# Patient Record
Sex: Female | Born: 1970 | Race: Black or African American | Hispanic: No | Marital: Single | Smoking: Current every day smoker
Health system: Southern US, Community
[De-identification: ages and names within clinical notes are randomized; demographics above are authoritative.]

## PROBLEM LIST (undated history)

## (undated) DIAGNOSIS — I10 Essential (primary) hypertension: Secondary | ICD-10-CM

## (undated) DIAGNOSIS — J40 Bronchitis, not specified as acute or chronic: Secondary | ICD-10-CM

## (undated) DIAGNOSIS — M79672 Pain in left foot: Secondary | ICD-10-CM

## (undated) DIAGNOSIS — R45851 Suicidal ideations: Secondary | ICD-10-CM

## (undated) DIAGNOSIS — G8929 Other chronic pain: Secondary | ICD-10-CM

## (undated) DIAGNOSIS — J45909 Unspecified asthma, uncomplicated: Secondary | ICD-10-CM

## (undated) DIAGNOSIS — F1910 Other psychoactive substance abuse, uncomplicated: Secondary | ICD-10-CM

## (undated) DIAGNOSIS — K219 Gastro-esophageal reflux disease without esophagitis: Secondary | ICD-10-CM

## (undated) DIAGNOSIS — R569 Unspecified convulsions: Secondary | ICD-10-CM

## (undated) HISTORY — DX: Unspecified convulsions: R56.9

## (undated) HISTORY — PX: ABDOMINAL HYSTERECTOMY: SHX81

---

## 2001-05-22 ENCOUNTER — Emergency Department (HOSPITAL_COMMUNITY): Admission: EM | Admit: 2001-05-22 | Discharge: 2001-05-22 | Payer: Self-pay | Admitting: Emergency Medicine

## 2002-05-20 ENCOUNTER — Emergency Department (HOSPITAL_COMMUNITY): Admission: EM | Admit: 2002-05-20 | Discharge: 2002-05-20 | Payer: Self-pay | Admitting: Emergency Medicine

## 2002-09-06 ENCOUNTER — Emergency Department (HOSPITAL_COMMUNITY): Admission: EM | Admit: 2002-09-06 | Discharge: 2002-09-07 | Payer: Self-pay | Admitting: Emergency Medicine

## 2002-09-06 ENCOUNTER — Emergency Department (HOSPITAL_COMMUNITY): Admission: EM | Admit: 2002-09-06 | Discharge: 2002-09-06 | Payer: Self-pay | Admitting: Emergency Medicine

## 2002-09-06 ENCOUNTER — Encounter: Payer: Self-pay | Admitting: Emergency Medicine

## 2002-09-17 ENCOUNTER — Emergency Department (HOSPITAL_COMMUNITY): Admission: EM | Admit: 2002-09-17 | Discharge: 2002-09-17 | Payer: Self-pay

## 2002-10-21 ENCOUNTER — Emergency Department (HOSPITAL_COMMUNITY): Admission: EM | Admit: 2002-10-21 | Discharge: 2002-10-21 | Payer: Self-pay | Admitting: Emergency Medicine

## 2003-02-10 ENCOUNTER — Emergency Department (HOSPITAL_COMMUNITY): Admission: EM | Admit: 2003-02-10 | Discharge: 2003-02-10 | Payer: Self-pay | Admitting: Emergency Medicine

## 2003-05-10 ENCOUNTER — Encounter: Payer: Self-pay | Admitting: General Surgery

## 2003-05-10 ENCOUNTER — Inpatient Hospital Stay (HOSPITAL_COMMUNITY): Admission: AD | Admit: 2003-05-10 | Discharge: 2003-05-16 | Payer: Self-pay | Admitting: General Surgery

## 2003-05-16 ENCOUNTER — Encounter: Payer: Self-pay | Admitting: General Surgery

## 2003-07-08 ENCOUNTER — Emergency Department (HOSPITAL_COMMUNITY): Admission: EM | Admit: 2003-07-08 | Discharge: 2003-07-08 | Payer: Self-pay | Admitting: Emergency Medicine

## 2003-07-28 ENCOUNTER — Emergency Department (HOSPITAL_COMMUNITY): Admission: EM | Admit: 2003-07-28 | Discharge: 2003-07-28 | Payer: Self-pay | Admitting: Emergency Medicine

## 2003-09-14 ENCOUNTER — Emergency Department (HOSPITAL_COMMUNITY): Admission: EM | Admit: 2003-09-14 | Discharge: 2003-09-14 | Payer: Self-pay | Admitting: Emergency Medicine

## 2003-10-21 ENCOUNTER — Ambulatory Visit (HOSPITAL_COMMUNITY): Admission: RE | Admit: 2003-10-21 | Discharge: 2003-10-21 | Payer: Self-pay | Admitting: General Surgery

## 2003-11-10 ENCOUNTER — Emergency Department (HOSPITAL_COMMUNITY): Admission: EM | Admit: 2003-11-10 | Discharge: 2003-11-11 | Payer: Self-pay | Admitting: Emergency Medicine

## 2003-11-15 ENCOUNTER — Ambulatory Visit (HOSPITAL_COMMUNITY): Admission: RE | Admit: 2003-11-15 | Discharge: 2003-11-15 | Payer: Self-pay | Admitting: Internal Medicine

## 2004-01-04 ENCOUNTER — Emergency Department (HOSPITAL_COMMUNITY): Admission: EM | Admit: 2004-01-04 | Discharge: 2004-01-04 | Payer: Self-pay | Admitting: Emergency Medicine

## 2004-01-13 ENCOUNTER — Emergency Department (HOSPITAL_COMMUNITY): Admission: EM | Admit: 2004-01-13 | Discharge: 2004-01-13 | Payer: Self-pay | Admitting: Emergency Medicine

## 2004-04-13 ENCOUNTER — Emergency Department (HOSPITAL_COMMUNITY): Admission: EM | Admit: 2004-04-13 | Discharge: 2004-04-13 | Payer: Self-pay | Admitting: Emergency Medicine

## 2004-04-14 ENCOUNTER — Emergency Department (HOSPITAL_COMMUNITY): Admission: EM | Admit: 2004-04-14 | Discharge: 2004-04-14 | Payer: Self-pay | Admitting: Emergency Medicine

## 2004-07-19 ENCOUNTER — Emergency Department (HOSPITAL_COMMUNITY): Admission: EM | Admit: 2004-07-19 | Discharge: 2004-07-19 | Payer: Self-pay

## 2005-01-27 ENCOUNTER — Emergency Department (HOSPITAL_COMMUNITY): Admission: EM | Admit: 2005-01-27 | Discharge: 2005-01-27 | Payer: Self-pay | Admitting: Emergency Medicine

## 2005-03-24 ENCOUNTER — Emergency Department (HOSPITAL_COMMUNITY): Admission: EM | Admit: 2005-03-24 | Discharge: 2005-03-24 | Payer: Self-pay | Admitting: Emergency Medicine

## 2005-03-30 ENCOUNTER — Emergency Department (HOSPITAL_COMMUNITY): Admission: EM | Admit: 2005-03-30 | Discharge: 2005-03-30 | Payer: Self-pay | Admitting: Emergency Medicine

## 2006-04-20 ENCOUNTER — Ambulatory Visit (HOSPITAL_COMMUNITY): Admission: RE | Admit: 2006-04-20 | Discharge: 2006-04-20 | Payer: Self-pay | Admitting: Internal Medicine

## 2006-11-09 ENCOUNTER — Emergency Department (HOSPITAL_COMMUNITY): Admission: EM | Admit: 2006-11-09 | Discharge: 2006-11-09 | Payer: Self-pay | Admitting: Emergency Medicine

## 2007-05-19 ENCOUNTER — Emergency Department (HOSPITAL_COMMUNITY): Admission: EM | Admit: 2007-05-19 | Discharge: 2007-05-19 | Payer: Self-pay | Admitting: *Deleted

## 2007-05-19 ENCOUNTER — Emergency Department (HOSPITAL_COMMUNITY): Admission: EM | Admit: 2007-05-19 | Discharge: 2007-05-19 | Payer: Self-pay | Admitting: Emergency Medicine

## 2007-09-30 ENCOUNTER — Emergency Department (HOSPITAL_COMMUNITY): Admission: EM | Admit: 2007-09-30 | Discharge: 2007-09-30 | Payer: Self-pay | Admitting: Emergency Medicine

## 2008-01-04 ENCOUNTER — Emergency Department (HOSPITAL_COMMUNITY): Admission: EM | Admit: 2008-01-04 | Discharge: 2008-01-04 | Payer: Self-pay | Admitting: Emergency Medicine

## 2008-05-20 ENCOUNTER — Observation Stay (HOSPITAL_COMMUNITY): Admission: EM | Admit: 2008-05-20 | Discharge: 2008-05-21 | Payer: Self-pay | Admitting: Emergency Medicine

## 2008-06-28 ENCOUNTER — Emergency Department (HOSPITAL_COMMUNITY): Admission: EM | Admit: 2008-06-28 | Discharge: 2008-06-28 | Payer: Self-pay | Admitting: Emergency Medicine

## 2008-07-10 ENCOUNTER — Emergency Department (HOSPITAL_COMMUNITY): Admission: EM | Admit: 2008-07-10 | Discharge: 2008-07-10 | Payer: Self-pay | Admitting: Emergency Medicine

## 2008-12-19 ENCOUNTER — Emergency Department (HOSPITAL_COMMUNITY): Admission: EM | Admit: 2008-12-19 | Discharge: 2008-12-19 | Payer: Self-pay | Admitting: Emergency Medicine

## 2009-01-04 ENCOUNTER — Emergency Department (HOSPITAL_COMMUNITY): Admission: EM | Admit: 2009-01-04 | Discharge: 2009-01-05 | Payer: Self-pay | Admitting: Emergency Medicine

## 2009-01-25 ENCOUNTER — Emergency Department (HOSPITAL_COMMUNITY): Admission: EM | Admit: 2009-01-25 | Discharge: 2009-01-25 | Payer: Self-pay | Admitting: Emergency Medicine

## 2009-02-16 ENCOUNTER — Emergency Department (HOSPITAL_COMMUNITY): Admission: EM | Admit: 2009-02-16 | Discharge: 2009-02-16 | Payer: Self-pay | Admitting: Emergency Medicine

## 2009-02-21 ENCOUNTER — Emergency Department (HOSPITAL_COMMUNITY): Admission: EM | Admit: 2009-02-21 | Discharge: 2009-02-21 | Payer: Self-pay | Admitting: Emergency Medicine

## 2009-11-14 ENCOUNTER — Emergency Department (HOSPITAL_COMMUNITY): Admission: EM | Admit: 2009-11-14 | Discharge: 2009-11-14 | Payer: Self-pay | Admitting: Emergency Medicine

## 2010-01-27 ENCOUNTER — Emergency Department (HOSPITAL_COMMUNITY): Admission: EM | Admit: 2010-01-27 | Discharge: 2010-01-27 | Payer: Self-pay | Admitting: Emergency Medicine

## 2010-06-24 ENCOUNTER — Emergency Department (HOSPITAL_COMMUNITY): Admission: EM | Admit: 2010-06-24 | Discharge: 2010-06-24 | Payer: Self-pay | Admitting: Emergency Medicine

## 2010-06-26 ENCOUNTER — Emergency Department (HOSPITAL_COMMUNITY): Admission: EM | Admit: 2010-06-26 | Discharge: 2010-06-26 | Payer: Self-pay | Admitting: Emergency Medicine

## 2010-07-31 ENCOUNTER — Emergency Department (HOSPITAL_COMMUNITY): Admission: EM | Admit: 2010-07-31 | Discharge: 2010-07-31 | Payer: Self-pay | Admitting: Emergency Medicine

## 2010-11-16 ENCOUNTER — Emergency Department (HOSPITAL_COMMUNITY)
Admission: EM | Admit: 2010-11-16 | Discharge: 2010-11-16 | Payer: Self-pay | Source: Home / Self Care | Admitting: Emergency Medicine

## 2010-11-18 LAB — URINALYSIS, ROUTINE W REFLEX MICROSCOPIC
Bilirubin Urine: NEGATIVE
Ketones, ur: NEGATIVE mg/dL
Leukocytes, UA: NEGATIVE
Nitrite: NEGATIVE
Protein, ur: NEGATIVE mg/dL
Specific Gravity, Urine: 1.01 (ref 1.005–1.030)
Urine Glucose, Fasting: NEGATIVE mg/dL
Urobilinogen, UA: 0.2 mg/dL (ref 0.0–1.0)
pH: 6 (ref 5.0–8.0)

## 2010-11-18 LAB — URINE MICROSCOPIC-ADD ON

## 2010-11-18 LAB — DIFFERENTIAL
Basophils Absolute: 0.1 10*3/uL (ref 0.0–0.1)
Basophils Relative: 1 % (ref 0–1)
Eosinophils Absolute: 0.2 10*3/uL (ref 0.0–0.7)
Eosinophils Relative: 2 % (ref 0–5)
Lymphocytes Relative: 32 % (ref 12–46)
Lymphs Abs: 2.5 10*3/uL (ref 0.7–4.0)
Monocytes Absolute: 0.7 10*3/uL (ref 0.1–1.0)
Monocytes Relative: 9 % (ref 3–12)
Neutro Abs: 4.5 10*3/uL (ref 1.7–7.7)
Neutrophils Relative %: 56 % (ref 43–77)

## 2010-11-18 LAB — CBC
HCT: 39.6 % (ref 36.0–46.0)
Hemoglobin: 13.4 g/dL (ref 12.0–15.0)
MCH: 27.4 pg (ref 26.0–34.0)
MCHC: 33.8 g/dL (ref 30.0–36.0)
MCV: 81 fL (ref 78.0–100.0)
Platelets: 185 10*3/uL (ref 150–400)
RBC: 4.89 MIL/uL (ref 3.87–5.11)
RDW: 15.7 % — ABNORMAL HIGH (ref 11.5–15.5)
WBC: 8 10*3/uL (ref 4.0–10.5)

## 2010-11-18 LAB — GLUCOSE, CAPILLARY: Glucose-Capillary: 93 mg/dL (ref 70–99)

## 2010-11-18 LAB — BASIC METABOLIC PANEL
BUN: 12 mg/dL (ref 6–23)
CO2: 24 mEq/L (ref 19–32)
Calcium: 8.5 mg/dL (ref 8.4–10.5)
Chloride: 107 mEq/L (ref 96–112)
Creatinine, Ser: 0.84 mg/dL (ref 0.4–1.2)
GFR calc Af Amer: >60 mL/min (ref >60)
GFR calc non Af Amer: >60 mL/min (ref >60)
Glucose, Bld: 106 mg/dL — ABNORMAL HIGH (ref 70–99)
Potassium: 4.2 mEq/L (ref 3.5–5.1)
Sodium: 138 mEq/L (ref 135–145)

## 2010-11-18 LAB — POCT CARDIAC MARKERS
CKMB, poc: <1 ng/mL — ABNORMAL LOW (ref 1.0–8.0)
Myoglobin, poc: 18.2 ng/mL (ref 12–200)
Troponin i, poc: <0.05 ng/mL (ref 0.00–0.09)

## 2010-11-18 LAB — POCT PREGNANCY, URINE: Preg Test, Ur: NEGATIVE

## 2011-01-14 LAB — GLUCOSE, CAPILLARY
Glucose-Capillary: 117 mg/dL — ABNORMAL HIGH (ref 70–99)
Glucose-Capillary: 95 mg/dL (ref 70–99)

## 2011-02-10 LAB — CULTURE, ROUTINE-ABSCESS

## 2011-02-16 LAB — URINALYSIS, ROUTINE W REFLEX MICROSCOPIC
Glucose, UA: NEGATIVE mg/dL
Ketones, ur: NEGATIVE mg/dL
Leukocytes, UA: NEGATIVE
pH: 5.5 (ref 5.0–8.0)

## 2011-02-16 LAB — COMPREHENSIVE METABOLIC PANEL
ALT: 19 U/L (ref 0–35)
AST: 17 U/L (ref 0–37)
Calcium: 8.7 mg/dL (ref 8.4–10.5)
GFR calc Af Amer: >60 mL/min (ref >60)
Sodium: 136 mEq/L (ref 135–145)
Total Protein: 6.5 g/dL (ref 6.0–8.3)

## 2011-02-16 LAB — URINE MICROSCOPIC-ADD ON

## 2011-02-16 LAB — CULTURE, ROUTINE-ABSCESS

## 2011-02-16 LAB — DIFFERENTIAL
Eosinophils Absolute: 0.1 10*3/uL (ref 0.0–0.7)
Eosinophils Relative: 2 % (ref 0–5)
Lymphocytes Relative: 30 % (ref 12–46)
Lymphs Abs: 2.4 10*3/uL (ref 0.7–4.0)
Monocytes Relative: 10 % (ref 3–12)
Neutrophils Relative %: 58 % (ref 43–77)

## 2011-02-16 LAB — CBC
MCHC: 33.1 g/dL (ref 30.0–36.0)
RDW: 14.9 % (ref 11.5–15.5)

## 2011-02-16 LAB — WET PREP, GENITAL

## 2011-02-16 LAB — GC/CHLAMYDIA PROBE AMP, GENITAL
Chlamydia, DNA Probe: NEGATIVE
GC Probe Amp, Genital: NEGATIVE
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
## 2011-03-16 NOTE — Discharge Summary (Signed)
NAMESHAWNYA, MAYOR               ACCOUNT NO.:  [id number]   MEDICAL RECORD NO.:  [id number]          PATIENT TYPE:  OBV   LOCATION:  A332                          FACILITY:  [facility]   PHYSICIAN:  Osvaldo Shipper, MD     DATE OF BIRTH:  11/09/1970   DATE OF ADMISSION:  05/20/2008  DATE OF DISCHARGE:  LH                               DISCHARGE SUMMARY   ADDENDUM:  This is an addendum to the discharge summary dictated on Ms.  Mcfaul.   DISCHARGE MEDICATIONS:  1. Aspirin 81 mg daily.  2. Ketorolac 10 mg p.o. q.6 h p.r.n. for pain, #20 tablets prescribed.  3. Omeprazole 20 mg p.o. daily for 2 weeks.   FOLLOW UP:  Follow up with the health department and with mental health  for drug rehabilitation as recommended by social worker.   DIET:  Heart healthy.   ACTIVITY:  Physical activity as before.  She has been told if the foot  pain does not improve with the above regimen, she can seek attention  again.   ADDENDUM:  Uric acid level was normal.      Osvaldo Shipper, MD  Electronically Signed     GK/MEDQ  D:  05/21/2008  T:  05/21/2008  Job:  [id number]

## 2011-03-19 NOTE — H&P (Signed)
   [name], [name]                         ACCOUNT NO.:  [id number]   MEDICAL RECORD NO.:  [id number]                   PATIENT TYPE:  INP   LOCATION:  A419                                 FACILITY:  [facility]   PHYSICIAN:  Jerolyn Shin C. [name], M.D.                DATE OF BIRTH:  1971/03/08   DATE OF ADMISSION:  05/10/2003  DATE OF DISCHARGE:                                HISTORY & PHYSICAL   HISTORY OF PRESENT ILLNESS:  Katrina Good is a 40 year old with a history of  severe abdominal pain lasting greater than six months   Dictation ended at this point.                                               Katrina Good. [name], M.D.    LCS/MEDQ  D:  05/11/2003  T:  05/11/2003  Job:  [id number]

## 2011-03-19 NOTE — Discharge Summary (Signed)
Katrina Good, POMEROY A                         ACCOUNT NO.:  [id number]   MEDICAL RECORD NO.:  [id number]                   PATIENT TYPE:  INP   LOCATION:  A419                                 FACILITY:  [facility]   PHYSICIAN:  Jerolyn Shin C. [name], M.D.                DATE OF BIRTH:  25-Feb-1971   DATE OF ADMISSION:  05/10/2003  DATE OF DISCHARGE:  05/16/2003                                 DISCHARGE SUMMARY   DISCHARGE DIAGNOSES:  1. Severe dysfunctional uterine bleeding.  2. Severe depression.  3. Cocaine and marijuana abuse.  4. Alcohol abuse.  5. Chronic tobacco abuse.   SPECIAL PROCEDURE:  Total abdominal hysterectomy on May 14, 2003.   DISPOSITION:  Patient discharged home in stable condition.   DISCHARGE MEDICATIONS:  1. Mepergan Fortis two every four hours as needed for pain.  2. Reglan 10 mg a.c. and h.s.  3. The patient is scheduled to be seen in the office in one week post     discharge.   SUMMARY:  A 40 year old female with a six month history of acute severe  abdominal pain continuously.  She has had multiple episodes of severe  abdominal pain due to dysmenorrhea dating back over six years.  She was seen  in the emergency room eight times over the past six months with pain and  bleeding.  She was seen by Dr. Emelda Fear and was noted to be anemic.  She was  treated with oral contraceptives, Provera and IM estrogen without  improvement.  Ultrasounds, in the past, have shown severely thickened  endometrium with central echogenicity.  Repeat ultrasound showed similar  findings.  The patient was admitted because of severe bleeding as outline in  the admission note.  It was noted that she had a positive drug screen and  that she has a history of heavy alcohol abuse dating back to 1995.  She also  has a history of cocaine and marijuana abuse.  The patient was admitted.  She had a very tender pelvis.  Urine drug screen was positive for cocaine.  She  had severe bleeding and it was  elected to treat her with IV estrogen to  try to reduce the amount of bleeding and to allow her time for  detoxification from the cocaine.  This was done.  Her bleeding ceased.  Hemoglobin only dropped to 11.1.   Total abdominal hysterectomy was done on 14 May 2003.  She had an  uneventful postop period without complications and was discharged home on 16 May 2003 in satisfactory condition.     ___________________________________________                                         Dirk Dress [name], M.D.   LCS/MEDQ  D:  07/27/2003  T:  07/27/2003  Job:  [id number]

## 2011-03-19 NOTE — Op Note (Signed)
   Katrina Good, Katrina Good                         ACCOUNT NO.:  [id number]   MEDICAL RECORD NO.:  [id number]                   PATIENT TYPE:  INP   LOCATION:  A419                                 FACILITY:  [facility]   PHYSICIAN:  Jerolyn Shin C. [name], M.D.                DATE OF BIRTH:  02/26/1971   DATE OF PROCEDURE:  DATE OF DISCHARGE:                                 OPERATIVE REPORT   PREOPERATIVE DIAGNOSIS:  Dysfunctional uterine bleeding with anemia.   POSTOPERATIVE DIAGNOSIS:  Dysfunctional uterine bleeding with anemia.   PROCEDURE:  Total abdominal hysterectomy.   SURGEON:  Dirk Dress. [name], M.D.   DESCRIPTION OF PROCEDURE:  Under general anesthesia the patient's abdomen  was prepped and draped in Good sterile field.  Good Pfannenstiel incision was  made.  Upon entering the abdomen Good nodular, thickened uterus was identified.  There was evidence of previous tubal ligations bilaterally.  No other  pathology was noted. There was no evidence of endometriosis.  The  tuboovarian complexes was doubly clamped at its junction with the uterus.  They were divided without difficulty.  The pedicles were controlled with  suture ligatures of #0 Dexon.   Next, anterior bladder flap was developed down to the level of the cervix.  The uterine vessels were doubly clamped using Kocher clamp and Heaney clamp  and divided.  The vessels were controlled with sutures of #0 Dexon. This was  continued sequentially along the lateral aspects of the uterus down to the  cervix.   Using Good curved clamp, the apex of the vagina was clamped bilaterally.  The  tissue was then controlled with Good Heaney suture of #0 Dexon.  The cuff was  then closed with running locking #0 Biosyn.  Hemostasis was achieved.  There  was minimal blood loss.  Bladder flap was closed for reperitonealization of  the pelvis using 3-0 Biosyn.  Hemostasis was felt to be adequate.  Irrigation was carried out.   Sponge, needle, instrument and blade  counts were verified as correct x3.  The abdomen was closed using 2-0 Biosyn on the peritoneum and rectus muscle  fascia.  The anterior fascia was closed with #0 Prolene.  The subcutaneous  tissue was closed with 2-0 and 3-0 Biosyn.  The skin was closed with  staples.  Dressing of OpSite was placed.  The patient tolerated the  procedure well.  She was awakened from anesthesia,  transferred to Good bed,  and taken to the postanesthetic care unit.                                               Dirk Dress. [name], M.D.    LCS/MEDQ  D:  05/14/2003  T:  05/14/2003  Job:  [id number]

## 2011-03-19 NOTE — H&P (Signed)
Katrina Good, Katrina Good                         ACCOUNT NO.:  [id number]   MEDICAL RECORD NO.:  [id number]                   PATIENT TYPE:  INP   LOCATION:  A419                                 FACILITY:  [facility]   PHYSICIAN:  Jerolyn Shin C. [name], M.D.                DATE OF BIRTH:  1971-10-04   DATE OF ADMISSION:  05/10/2003  DATE OF DISCHARGE:                                HISTORY & PHYSICAL   HISTORY OF PRESENT ILLNESS:  Good 40 year old female with history of severe  abdominal pain continuously for six months.  The patient has had multiple  episodes of severe abdominal pain due to dysmenorrhea dating back to 1998.  Over the past six months she has been seen in the emergency room at least  eight times with complaint of pain and bleeding.  She was seen by Dr.  Emelda Fear three months ago and placed on iron therapy.  Hematocrit at that  time was about 31.  She has been treated in the past with oral  contraceptives, Provera, IM estrogen - all without improvement.  Ultrasounds  in the past have shown severely thickened endometrium with central  echogenicity.  Repeat ultrasound has revealed the same problem.  The patient  has been advised in the past that she needed hysterectomy but either she  refused or it was not done because she did not have any insurance coverage.  She has become essentially totally incapacitated by her heavy bleeding and  she is admitted because of severe pain, nausea, inability to keep down  medications, and the need to treat her with IV medications to control her  pain as well as to reduce the bleeding.  At the present time she is using  double super pads every hour during the day including nighttime.  Sometimes  she has to use three super pads per hour at night.  She has large clots  which increases the severity of her pain.  She states that she takes  ibuprofen 800 mg four to five times Good day and she uses additional Advil two  to three at Good time but this does not control  her pain.   PAST HISTORY:  Essentially negative except she has severe dysmenorrhea and  chronic depression.  She had Good tubal ligation in 1995.  She has had two C-  sections.  By her history she is Good gravida 4 para 2 AB 2 with two living  children.  By her OB history in the chart she is Good gravida 2 para 2 with no  spontaneous abortions.  She is single.  She has Good long history of cigarette  use.  She admits to three packs per day now but has used three to five packs  per day in the past.  She has Good long history of heavy alcohol use dating  back to her late teenage years.  She does not admit to  the volume of alcohol  that she drinks.  Throughout her record dating back to 1995 she has had Good  history of cocaine and marijuana use.  Urine drug screens that were done in  the emergency room have shown cocaine and marijuana.   PHYSICAL EXAMINATION:  GENERAL:  On examination the patient appeared to be  in severe abdominal distress.  VITAL SIGNS:  Blood pressure 140/80, pulse 88, respirations 12, weight was  191 pounds.  She appeared to be very depressed.  HEENT:  Unremarkable.  Teeth are in fair repair.  Oropharynx unremarkable.  NECK:  Supple.  No JVD or bruit.  CHEST:  Clear with no rales, rubs, rhonchi, or wheezes.  HEART:  Regular rate and rhythm without murmur, gallop, or rub.  ABDOMEN:  Diffusely tender, mildly distended, normal active bowel sounds.  More severe tenderness in the pelvis, especially in the left lower quadrant.  There is Good fullness there but I cannot palpate any mass.  PELVIC:  Revealed normal external genitalia, active vaginal bleeding, with  extreme tenderness of her uterus and cervix.  I could not get full  evaluation of the uterus but it appears boggy and enlarged.  RECTAL:  Normal.  No tenderness, no masses.  NEUROLOGIC:  No focal motor, sensory, or cerebellar deficits.   IMPRESSION:  1. Severe uncontrolled dysfunctional uterine bleeding in Good 40 year old     female who  is status post tubal ligation x9 years with chronic anemia.  2. Severe depression, probably complicated by her long history of     dysfunctional uterine bleeding and her social situation.  3. History of cocaine and marijuana use.  4. Long history of alcohol use.  5. Chronic tobacco abuse.   PLAN:  The patient will be admitted and will do urine drug screen.  She will  be treated symptomatically.  She will receive IV estrogen and pelvic CT will  be done. If the patient is positive for cocaine will allow her to detoxify  for Good few days and we will then proceed with hysterectomy.  She has been  informed according to Medicaid protocol that hysterectomy will leave her  permanently and totally incapable of bearing children.  It is acknowledged  that she has had tubal ligation for nine years.                                               Dirk Dress. [name], M.D.    LCS/MEDQ  D:  05/11/2003  T:  05/11/2003  Job:  [id number]

## 2011-04-28 ENCOUNTER — Emergency Department (HOSPITAL_COMMUNITY)
Admission: EM | Admit: 2011-04-28 | Discharge: 2011-04-28 | Disposition: A | Discharge status: Home / Self Care | Payer: Self-pay | Attending: Emergency Medicine | Admitting: Emergency Medicine

## 2011-04-28 DIAGNOSIS — I509 Heart failure, unspecified: Secondary | ICD-10-CM | POA: Insufficient documentation

## 2011-04-28 DIAGNOSIS — L02219 Cutaneous abscess of trunk, unspecified: Secondary | ICD-10-CM | POA: Insufficient documentation

## 2011-04-28 DIAGNOSIS — L03319 Cellulitis of trunk, unspecified: Secondary | ICD-10-CM | POA: Insufficient documentation

## 2011-04-28 DIAGNOSIS — I10 Essential (primary) hypertension: Secondary | ICD-10-CM | POA: Insufficient documentation

## 2011-04-28 DIAGNOSIS — F1910 Other psychoactive substance abuse, uncomplicated: Secondary | ICD-10-CM | POA: Insufficient documentation

## 2011-04-28 DIAGNOSIS — E119 Type 2 diabetes mellitus without complications: Secondary | ICD-10-CM | POA: Insufficient documentation

## 2011-04-28 DIAGNOSIS — F17200 Nicotine dependence, unspecified, uncomplicated: Secondary | ICD-10-CM | POA: Insufficient documentation

## 2011-05-20 ENCOUNTER — Emergency Department (HOSPITAL_COMMUNITY): Payer: Self-pay

## 2011-05-20 ENCOUNTER — Emergency Department (HOSPITAL_COMMUNITY)
Admission: EM | Admit: 2011-05-20 | Discharge: 2011-05-20 | Disposition: A | Discharge status: Home / Self Care | Payer: Self-pay | Attending: Emergency Medicine | Admitting: Emergency Medicine

## 2011-05-20 ENCOUNTER — Encounter: Payer: Self-pay | Admitting: Emergency Medicine

## 2011-05-20 DIAGNOSIS — E119 Type 2 diabetes mellitus without complications: Secondary | ICD-10-CM | POA: Insufficient documentation

## 2011-05-20 DIAGNOSIS — Y9269 Other specified industrial and construction area as the place of occurrence of the external cause: Secondary | ICD-10-CM | POA: Insufficient documentation

## 2011-05-20 DIAGNOSIS — I10 Essential (primary) hypertension: Secondary | ICD-10-CM | POA: Insufficient documentation

## 2011-05-20 DIAGNOSIS — F17200 Nicotine dependence, unspecified, uncomplicated: Secondary | ICD-10-CM | POA: Insufficient documentation

## 2011-05-20 DIAGNOSIS — X58XXXA Exposure to other specified factors, initial encounter: Secondary | ICD-10-CM | POA: Insufficient documentation

## 2011-05-20 DIAGNOSIS — T148XXA Other injury of unspecified body region, initial encounter: Secondary | ICD-10-CM | POA: Insufficient documentation

## 2011-05-20 HISTORY — DX: Essential (primary) hypertension: I10

## 2011-05-20 MED ORDER — ONDANSETRON HCL 4 MG PO TABS
4.0000 mg | ORAL_TABLET | Freq: Once | ORAL | Status: AC
  Administered 2011-05-20: 4 mg via ORAL
  Filled 2011-05-20: qty 1

## 2011-05-20 MED ORDER — HYDROCODONE-ACETAMINOPHEN 5-325 MG PO TABS
2.0000 | ORAL_TABLET | Freq: Once | ORAL | Status: AC
  Administered 2011-05-20: 2 via ORAL
  Filled 2011-05-20: qty 2

## 2011-05-20 MED ORDER — HYDROCODONE-ACETAMINOPHEN 7.5-500 MG PO TABS: 1.0000 | ORAL_TABLET | ORAL | Status: AC | PRN

## 2011-05-20 MED ORDER — DIAZEPAM 5 MG PO TABS
5.0000 mg | ORAL_TABLET | Freq: Once | ORAL | Status: AC
  Administered 2011-05-20: 5 mg via ORAL
  Filled 2011-05-20: qty 1

## 2011-05-20 NOTE — ED Notes (Signed)
Patient with no complaints at this time. Respirations even and unlabored. Skin warm/dry. Discharge instructions reviewed with patient at this time. Patient given opportunity to voice concerns/ask questions. Patient discharged at this time and left Emergency Department with steady gait.   

## 2011-05-20 NOTE — Progress Notes (Signed)
Xray findings discussed with pt. Crutches offered. Pt request not to have crutches at this time. Need for orthopedic eval discussed.

## 2011-05-20 NOTE — ED Notes (Signed)
Pt c/o left upper thigh pain that radiates below the knee for 3 days. Pt denies any injury.

## 2011-05-20 NOTE — ED Provider Notes (Signed)
History     Chief Complaint  Patient presents with  . Leg Pain   Patient is a 40 y.o. female presenting with leg pain. The history is provided by the patient.  Leg Pain  The incident occurred more than 2 days ago. The incident occurred at work. Injury mechanism: hip in back by a patient. The pain is present in the left hip and left thigh. The quality of the pain is described as sharp. The pain is at a severity of 10/10. The pain is severe.    Past Medical History  Diagnosis Date  . Hypertension   . Diabetes mellitus     History reviewed. No pertinent past surgical history.  History reviewed. No pertinent family history.  History  Substance Use Topics  . Smoking status: Current Everyday Smoker -- 0.5 packs/day    Types: Cigarettes  . Smokeless tobacco: Not on file  . Alcohol Use: No    OB History    Grav Para Term Preterm Abortions TAB SAB Ect Mult Living                  Review of Systems  Constitutional: Negative for activity change.       All ROS Neg except as noted in HPI  HENT: Negative for nosebleeds and neck pain.   Eyes: Negative for photophobia and discharge.  Respiratory: Negative for cough, shortness of breath and wheezing.   Cardiovascular: Negative for chest pain and palpitations.  Gastrointestinal: Negative for abdominal pain and blood in stool.  Genitourinary: Negative for dysuria, frequency and hematuria.  Musculoskeletal: Positive for myalgias and arthralgias. Negative for back pain.  Skin: Negative.   Neurological: Negative for dizziness, seizures and speech difficulty.  Psychiatric/Behavioral: Negative for hallucinations and confusion.    Physical Exam  BP 146/86  Pulse 71  Temp(Src) 99.2 F (37.3 C) (Oral)  Resp 20  SpO2 100%  Physical Exam  Nursing note and vitals reviewed. Constitutional: She is oriented to person, place, and time. She appears well-developed and well-nourished.  Non-toxic appearance.  HENT:  Head: Normocephalic.    Right Ear: Tympanic membrane and external ear normal.  Left Ear: Tympanic membrane and external ear normal.  Eyes: EOM and lids are normal. Pupils are equal, round, and reactive to light.  Neck: Normal range of motion. Neck supple. Carotid bruit is not present.  Cardiovascular: Normal rate, regular rhythm, normal heart sounds, intact distal pulses and normal pulses.   Pulmonary/Chest: Breath sounds normal. No respiratory distress.  Abdominal: Soft. Bowel sounds are normal. There is no tenderness. There is no guarding.  Musculoskeletal:       Pain with attempted ROM of the left hip at the upper thigh. No dislocation. No fractional shortening of the left lower ext. Distal pulse wnl. Good cap refill.  Lymphadenopathy:       Head (right side): No submandibular adenopathy present.       Head (left side): No submandibular adenopathy present.    She has no cervical adenopathy.  Neurological: She is alert and oriented to person, place, and time. She has normal strength. No cranial nerve deficit or sensory deficit.  Skin: Skin is warm and dry.  Psychiatric: She has a normal mood and affect. Her speech is normal.    ED Course  Procedures  MDM I have reviewed nursing notes, vital signs, and all appropriate lab and imaging results for this patient.      Kathie Dike, [place] 05/20/11 2003

## 2011-05-26 ENCOUNTER — Emergency Department (HOSPITAL_COMMUNITY)
Admission: EM | Admit: 2011-05-26 | Discharge: 2011-05-26 | Disposition: A | Discharge status: Home / Self Care | Payer: Self-pay | Attending: Emergency Medicine | Admitting: Emergency Medicine

## 2011-05-26 ENCOUNTER — Encounter (HOSPITAL_COMMUNITY): Payer: Self-pay | Admitting: Emergency Medicine

## 2011-05-26 DIAGNOSIS — I10 Essential (primary) hypertension: Secondary | ICD-10-CM | POA: Insufficient documentation

## 2011-05-26 DIAGNOSIS — F1410 Cocaine abuse, uncomplicated: Secondary | ICD-10-CM | POA: Insufficient documentation

## 2011-05-26 DIAGNOSIS — E119 Type 2 diabetes mellitus without complications: Secondary | ICD-10-CM | POA: Insufficient documentation

## 2011-05-26 DIAGNOSIS — F1490 Cocaine use, unspecified, uncomplicated: Secondary | ICD-10-CM

## 2011-05-26 DIAGNOSIS — F17200 Nicotine dependence, unspecified, uncomplicated: Secondary | ICD-10-CM | POA: Insufficient documentation

## 2011-05-26 HISTORY — DX: Suicidal ideations: R45.851

## 2011-05-26 HISTORY — DX: Other psychoactive substance abuse, uncomplicated: F19.10

## 2011-05-26 LAB — BASIC METABOLIC PANEL
BUN: 12 mg/dL (ref 6–23)
Chloride: 99 mEq/L (ref 96–112)
GFR calc Af Amer: >60 mL/min (ref >60)
Glucose, Bld: 92 mg/dL (ref 70–99)
Potassium: 3.9 mEq/L (ref 3.5–5.1)

## 2011-05-26 LAB — CBC
HCT: 43.4 % (ref 36.0–46.0)
Hemoglobin: 14.3 g/dL (ref 12.0–15.0)
RBC: 5.25 MIL/uL — ABNORMAL HIGH (ref 3.87–5.11)
WBC: 10.8 10*3/uL — ABNORMAL HIGH (ref 4.0–10.5)

## 2011-05-26 NOTE — ED Notes (Signed)
Pt went to [facility] today for help off drugs. Last use of crack was this past Friday.Petition states pt said she has an intent to cut her family up with a knife. Pt states that happened in the past and does not feel that way now. [employer]. [employer] at bedside.

## 2011-05-31 NOTE — ED Provider Notes (Addendum)
History     Chief Complaint  Patient presents with  . Suicidal   HPI Comments: Patient presented to [facility] today for stepped up assistance with substance abuse.  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  She was evaluated by a psychiatrist who reviewed her past chart which included homicidal thoughts toward family members but is not currently an issue with her.  She reports loving supportive family and appreciates their assistance with helping to keep her clean.  She was petitioned for involuntary commitment  nontheless by her provider at [facility].  She adamantly denies being homicidal or suicidal.  She simply wants help to stay clean from crack.  Patient is a 40 y.o. female presenting with mental health disorder. The history is provided by the patient.  Mental Health Problem The current episode started today.  Additional symptoms of the illness do not include no headaches or no abdominal pain. She does not admit to suicidal ideas. She does not have a plan to commit suicide. She does not contemplate injuring another person. She has not already  injured another person. Risk factors that are present for mental illness include substance abuse.    Past Medical History  Diagnosis Date  . Hypertension   . Diabetes mellitus   . Substance abuse   . Suicidal thoughts     Past Surgical History  Procedure Date  . Abdominal hysterectomy     History reviewed. No pertinent family history.  History  Substance Use Topics  . Smoking status: Current Everyday Smoker -- 0.5 packs/day    Types: Cigarettes  . Smokeless tobacco: Not on file  . Alcohol Use: No    OB History    Grav Para Term Preterm Abortions TAB SAB Ect Mult Living                  Review of Systems  Constitutional: Negative for fever.  HENT: Negative for congestion, sore throat and neck pain.   Eyes: Negative.   Respiratory: Negative for chest  tightness and shortness of breath.   Cardiovascular: Negative for chest pain.  Gastrointestinal: Negative for nausea and abdominal pain.  Genitourinary: Negative.   Musculoskeletal: Negative for joint swelling and arthralgias.  Skin: Negative.  Negative for rash and wound.  Neurological: Negative for dizziness, weakness, light-headedness, numbness and headaches.  Hematological: Negative.   Psychiatric/Behavioral: Negative.     Physical Exam  BP 147/93  Pulse 80  Temp(Src) 98.6 F (37 C) (Oral)  Resp 17  Ht 5\' 9"  (1.753 m)  Wt 215 lb (97.523 kg)  BMI 31.75 kg/m2  SpO2 100%  Physical Exam  Vitals reviewed. Constitutional: She is oriented to person, place, and time. She appears well-developed and well-nourished.  HENT:  Head: Normocephalic and atraumatic.  Eyes: Conjunctivae are normal.  Neck: Normal range of motion.  Cardiovascular: Normal rate, regular rhythm, normal heart sounds and intact distal pulses.   Pulmonary/Chest: Effort normal and breath sounds normal. She has no wheezes.  Abdominal: Soft. Bowel sounds are normal. There is no tenderness.  Musculoskeletal: Normal range of motion.  Neurological: She is alert and oriented to person, place, and time.  Skin: Skin is warm and dry.  Psychiatric: She has a normal mood and affect. Her speech is normal and behavior is normal. Judgment and thought content normal. Her mood appears not anxious. Her affect is not angry. Cognition and memory are normal. She does not  express impulsivity or inappropriate judgment. She does not exhibit a depressed mood. She expresses no suicidal plans and no homicidal plans.    ED Course  Procedures  MDM Patient evaluatied by ACT team and concurs with patients stability for safe dispo home.  Commitment papers rescinded.  Given resources by [facility]  for outpatient substance abuse tx.      Candis Musa, PA 05/31/11 2129  Evaluation and management procedures were performed by the PA/NP under my  supervision/collaboration.  Please see my note on this patient from the same date of encounter.  Felisa Bonier, MD 08/27/11 2142

## 2011-06-01 NOTE — ED Provider Notes (Signed)
Medical screening examination/treatment/procedure(s) were performed by non-physician practitioner and as supervising physician I was immediately available for consultation/collaboration.   Nelia Shi, MD 06/01/11 [phone]

## 2011-06-05 ENCOUNTER — Emergency Department (HOSPITAL_COMMUNITY)
Admission: EM | Admit: 2011-06-05 | Discharge: 2011-06-05 | Disposition: A | Discharge status: Home / Self Care | Payer: Self-pay | Attending: Emergency Medicine | Admitting: Emergency Medicine

## 2011-06-05 ENCOUNTER — Encounter (HOSPITAL_COMMUNITY): Payer: Self-pay | Admitting: *Deleted

## 2011-06-05 DIAGNOSIS — L0390 Cellulitis, unspecified: Secondary | ICD-10-CM

## 2011-06-05 DIAGNOSIS — F17200 Nicotine dependence, unspecified, uncomplicated: Secondary | ICD-10-CM | POA: Insufficient documentation

## 2011-06-05 DIAGNOSIS — IMO0002 Reserved for concepts with insufficient information to code with codable children: Secondary | ICD-10-CM | POA: Insufficient documentation

## 2011-06-05 DIAGNOSIS — I10 Essential (primary) hypertension: Secondary | ICD-10-CM | POA: Insufficient documentation

## 2011-06-05 DIAGNOSIS — E119 Type 2 diabetes mellitus without complications: Secondary | ICD-10-CM | POA: Insufficient documentation

## 2011-06-05 MED ORDER — LIDOCAINE-EPINEPHRINE (PF) 1 %-1:200000 IJ SOLN
INTRAMUSCULAR | Status: AC
  Filled 2011-06-05: qty 10

## 2011-06-05 MED ORDER — DOXYCYCLINE HYCLATE 100 MG PO TABS
100.0000 mg | ORAL_TABLET | Freq: Once | ORAL | Status: AC
  Administered 2011-06-05: 100 mg via ORAL
  Filled 2011-06-05: qty 1

## 2011-06-05 MED ORDER — LIDOCAINE-EPINEPHRINE (PF) 1 %-1:200000 IJ SOLN: 10.0000 mL | Freq: Once | INTRAMUSCULAR | Status: DC

## 2011-06-05 MED ORDER — LIDOCAINE-EPINEPHRINE 1 %-1:100000 IJ SOLN
10.0000 mL | Freq: Once | INTRAMUSCULAR | Status: DC
  Filled 2011-06-05: qty 10

## 2011-06-05 MED ORDER — DOXYCYCLINE HYCLATE 100 MG PO CAPS: 100.0000 mg | ORAL_CAPSULE | Freq: Two times a day (BID) | ORAL | Status: DC

## 2011-06-05 MED ORDER — HYDROCODONE-ACETAMINOPHEN 5-325 MG PO TABS: ORAL_TABLET | ORAL | Status: AC

## 2011-06-05 MED ORDER — OXYCODONE-ACETAMINOPHEN 5-325 MG PO TABS
1.0000 | ORAL_TABLET | Freq: Once | ORAL | Status: AC
  Administered 2011-06-05: 1 via ORAL
  Filled 2011-06-05: qty 1

## 2011-06-05 NOTE — ED Notes (Signed)
Pt c/o knot under her left armpit x 4 days but got worse today. Area red and swollen. No drainage at this time.

## 2011-06-05 NOTE — ED Notes (Signed)
Patient with no complaints at this time. Respirations even and unlabored. Skin warm/dry. Discharge instructions reviewed with patient at this time. Patient given opportunity to voice concerns/ask questions. Patient discharged at this time and left Emergency Department with steady gait.   

## 2011-06-05 NOTE — ED Provider Notes (Signed)
History     CSN: [id number] Arrival date & time: 06/05/2011  9:27 PM  Chief Complaint  Patient presents with  . Abscess   HPI Comments: Patient c/o "knot" to her left axilla that's been worsening for 4 days.  States she has had similar sx's in the past.  States she usually has to have them drained.  She denies rash, fever, numbness or weakness.  Patient is a 39 y.o. female presenting with abscess. The history is provided by the patient.  Abscess  This is a recurrent problem. The current episode started just prior to arrival. The onset was gradual. The problem occurs occasionally. The problem has been gradually worsening. Affected Location: left axilla. The problem is moderate. The abscess is characterized by painfulness, swelling and redness. The abscess first occurred at home. Pertinent negatives include no decrease in physical activity, no fever, no diarrhea, no vomiting, no congestion, no sore throat, no decreased responsiveness and no cough. Her past medical history is significant for skin abscesses in family. There were no sick contacts.    Past Medical History  Diagnosis Date  . Hypertension   . Diabetes mellitus   . Substance abuse   . Suicidal thoughts     Past Surgical History  Procedure Date  . Abdominal hysterectomy   . Cesarean section     x 2    History reviewed. No pertinent family history.  History  Substance Use Topics  . Smoking status: Current Everyday Smoker -- 0.5 packs/day    Types: Cigarettes  . Smokeless tobacco: Not on file  . Alcohol Use: No    OB History    Grav Para Term Preterm Abortions TAB SAB Ect Mult Living                  Review of Systems  Constitutional: Negative for fever, chills, appetite change and decreased responsiveness.  HENT: Negative for congestion, sore throat, facial swelling, neck pain and neck stiffness.   Respiratory: Negative for cough and shortness of breath.   Cardiovascular: Negative.   Gastrointestinal: Negative  for nausea, vomiting and diarrhea.  Musculoskeletal: Negative.   Skin: Positive for wound.  Neurological: Negative for weakness, numbness and headaches.  Hematological: Does not bruise/bleed easily.    Physical Exam  BP 129/75  Pulse 98  Temp(Src) 98.5 F (36.9 C) (Oral)  Resp 18  Ht 5\' 9"  (1.753 m)  Wt 216 lb (97.977 kg)  BMI 31.90 kg/m2  SpO2 100%  Physical Exam  Nursing note and vitals reviewed. Constitutional: She is oriented to person, place, and time. She appears well-developed and well-nourished.  HENT:  Head: Normocephalic and atraumatic.  Eyes: Pupils are equal, round, and reactive to light.  Neck: Normal range of motion. Neck supple.  Cardiovascular: Normal rate, regular rhythm and normal heart sounds.   Pulmonary/Chest: Effort normal and breath sounds normal.  Abdominal: Soft. There is no tenderness.  Musculoskeletal: She exhibits no edema and no tenderness.  Lymphadenopathy:    She has no cervical adenopathy.  Neurological: She is alert and oriented to person, place, and time. No cranial nerve deficit. She exhibits normal muscle tone.  Skin: Skin is dry. There is erythema.       5 cm fluctuant abscess to the left axilla.  Mild surrounding erythema.  No lymphangitis.  No drainage  Psychiatric: She has a normal mood and affect.    ED Course  INCISION AND DRAINAGE Date/Time: 06/05/2011 11:00 PM Performed by: Trisha Mangle, TAMMY L. Authorized by: Dierdre Highman,  Deltha Bernales Consent: Verbal consent obtained. Written consent not obtained. Risks and benefits: risks, benefits and alternatives were discussed Consent given by: patient Patient understanding: patient states understanding of the procedure being performed Patient consent: the patient's understanding of the procedure matches consent given Procedure consent: procedure consent matches procedure scheduled Site marked: the operative site was marked Patient identity confirmed: verbally with patient Time out: Immediately prior to  procedure a "time out" was called to verify the correct patient, procedure, equipment, support staff and site/side marked as required. Type: abscess Body area: upper extremity (left axilla) Anesthesia: local infiltration Local anesthetic: lidocaine 1% with epinephrine Anesthetic total: 3 ml Patient sedated: no Scalpel size: 11 Needle gauge: 25. Incision type: single straight Complexity: complex Drainage: purulent Drainage amount: moderate Wound treatment: wound left open Packing material: 1/4 in iodoform gauze Patient tolerance: Patient tolerated the procedure well with no immediate complications.    MDM  PAtient agrees to return here in 2 days for recheck and packing removal.           Tammy L. Hutchins, [place] 06/07/11 1523  Medical screening examination/treatment/procedure(s) were performed by non-physician practitioner and as supervising physician I was immediately available for consultation/collaboration.   Sunnie Nielsen, MD 06/10/11 [phone]

## 2011-06-08 ENCOUNTER — Encounter (HOSPITAL_COMMUNITY): Payer: Self-pay | Admitting: Emergency Medicine

## 2011-06-08 ENCOUNTER — Emergency Department (HOSPITAL_COMMUNITY)
Admission: EM | Admit: 2011-06-08 | Discharge: 2011-06-08 | Disposition: A | Discharge status: Home / Self Care | Payer: Self-pay | Attending: Emergency Medicine | Admitting: Emergency Medicine

## 2011-06-08 DIAGNOSIS — F17200 Nicotine dependence, unspecified, uncomplicated: Secondary | ICD-10-CM | POA: Insufficient documentation

## 2011-06-08 DIAGNOSIS — I10 Essential (primary) hypertension: Secondary | ICD-10-CM | POA: Insufficient documentation

## 2011-06-08 DIAGNOSIS — E119 Type 2 diabetes mellitus without complications: Secondary | ICD-10-CM | POA: Insufficient documentation

## 2011-06-08 DIAGNOSIS — Z5189 Encounter for other specified aftercare: Secondary | ICD-10-CM | POA: Insufficient documentation

## 2011-06-08 NOTE — ED Notes (Signed)
Sterile dressing applied to left axillary area over wound. Patient given instructions on wound care and signs of infection. Verbal understanding given on instructions by patient.

## 2011-06-08 NOTE — ED Notes (Signed)
Patient returns for packing removal and wound check. I&D of abscess done 06/05/11.

## 2011-06-08 NOTE — ED Provider Notes (Signed)
Medical screening examination/treatment/procedure(s) were performed by non-physician practitioner and as supervising physician I was immediately available for consultation/collaboration.   Joya Gaskins, MD 06/08/11 [phone]

## 2011-06-08 NOTE — ED Provider Notes (Signed)
History     CSN: [id number] Arrival date & time: 06/08/2011  9:14 AM  Chief Complaint  Patient presents with  . Wound Check   Patient is a 40 y.o. female presenting with wound check. The history is provided by the patient (has not filled rx for doxycycline b/c she didn't has any money.  will get filled today.). No language interpreter was used.  Wound Check  Treated in ED: seen in MD's office a week ago.  x-ray showed constipation.   states he's on a new fluid pill and a pain pill.  rx for stool softener not helping.  no fever or chills.  no abdominal pain. Previous treatment in the ED includes I&D of abscess.    Past Medical History  Diagnosis Date  . Hypertension   . Diabetes mellitus   . Substance abuse   . Suicidal thoughts     Past Surgical History  Procedure Date  . Abdominal hysterectomy   . Cesarean section     x 2    No family history on file.  History  Substance Use Topics  . Smoking status: Current Everyday Smoker -- 0.5 packs/day    Types: Cigarettes  . Smokeless tobacco: Not on file  . Alcohol Use: No    OB History    Grav Para Term Preterm Abortions TAB SAB Ect Mult Living                  Review of Systems  Gastrointestinal: Positive for constipation.  All other systems reviewed and are negative.    Physical Exam  BP 113/78  Pulse 88  Temp(Src) 98.6 F (37 C) (Oral)  Resp 16  SpO2 98%  Physical Exam  Nursing note and vitals reviewed. Constitutional: She is oriented to person, place, and time. Vital signs are normal. She appears well-developed and well-nourished.  HENT:  Head: Normocephalic and atraumatic.  Right Ear: External ear normal.  Left Ear: External ear normal.  Nose: Nose normal.  Mouth/Throat: No oropharyngeal exudate.  Eyes: Conjunctivae and EOM are normal. Pupils are equal, round, and reactive to light. Right eye exhibits no discharge. Left eye exhibits no discharge. No scleral icterus.  Neck: Normal range of motion. Neck  supple. No JVD present. No tracheal deviation present. No thyromegaly present.  Cardiovascular: Normal rate, regular rhythm, normal heart sounds, intact distal pulses and normal pulses.  Exam reveals no gallop and no friction rub.   No murmur heard. Pulmonary/Chest: Effort normal and breath sounds normal. No stridor. No respiratory distress. She has no wheezes. She has no rales. She exhibits no tenderness.  Abdominal: Soft. Normal appearance and bowel sounds are normal. She exhibits no distension and no mass. There is no tenderness. There is no rebound and no guarding.  Musculoskeletal: Normal range of motion. She exhibits no edema and no tenderness.  Lymphadenopathy:    She has no cervical adenopathy.  Neurological: She is alert and oriented to person, place, and time. She has normal reflexes. No cranial nerve deficit. Coordination normal. GCS eye subscore is 4. GCS verbal subscore is 5. GCS motor subscore is 6.  Skin: Skin is warm and dry. No rash noted. She is not diaphoretic.       Well-healing L axillary abscess.  i removed ~ 1 inch of packing.  Pt advised to do same once daily until gone.  Psychiatric: She has a normal mood and affect. Her speech is normal and behavior is normal. Judgment and thought content normal. Cognition and  memory are normal.    ED Course  Procedures  MDM       Worthy Rancher, PA 06/08/11 1007

## 2011-06-08 NOTE — ED Notes (Signed)
Patient with no complaints at this time. Respirations even and unlabored. Skin warm/dry. Discharge instructions reviewed with patient at this time. Patient given opportunity to voice concerns/ask questions. Patient discharged at this time and left Emergency Department with steady gait.   

## 2011-07-30 LAB — CBC
HCT: 46.7 — ABNORMAL HIGH
MCV: 83.7
RBC: 5.58 — ABNORMAL HIGH
WBC: 11.8 — ABNORMAL HIGH

## 2011-07-30 LAB — COMPREHENSIVE METABOLIC PANEL
ALT: 16
AST: 15
Alkaline Phosphatase: 106
CO2: 27
Chloride: 109
Creatinine, Ser: 0.99
GFR calc Af Amer: >60
GFR calc non Af Amer: >60
Total Bilirubin: 0.4

## 2011-07-30 LAB — CARDIAC PANEL(CRET KIN+CKTOT+MB+TROPI)
CK, MB: 0.7
CK, MB: 0.7
Relative Index: INVALID
Total CK: 64
Total CK: 68
Total CK: 83

## 2011-07-30 LAB — URINE MICROSCOPIC-ADD ON

## 2011-07-30 LAB — POCT CARDIAC MARKERS
Myoglobin, poc: 31.4
Myoglobin, poc: 42.8
Operator id: 247131
Troponin i, poc: <0.05

## 2011-07-30 LAB — URINALYSIS, ROUTINE W REFLEX MICROSCOPIC
Bilirubin Urine: NEGATIVE
Glucose, UA: NEGATIVE
pH: 5.5

## 2011-07-30 LAB — D-DIMER, QUANTITATIVE: D-Dimer, Quant: 0.23

## 2011-07-30 LAB — DIFFERENTIAL
Basophils Absolute: 0
Basophils Relative: 0
Eosinophils Absolute: 0.1
Eosinophils Relative: 1
Lymphocytes Relative: 25

## 2011-07-30 LAB — LIPID PANEL
LDL Cholesterol: 54
VLDL: 22

## 2011-07-30 LAB — RAPID URINE DRUG SCREEN, HOSP PERFORMED
Amphetamines: NOT DETECTED
Benzodiazepines: NOT DETECTED
Tetrahydrocannabinol: POSITIVE — AB

## 2011-07-30 LAB — URINE CULTURE: Colony Count: NO GROWTH

## 2011-08-10 LAB — WOUND CULTURE

## 2011-08-20 NOTE — ED Provider Notes (Signed)
Evaluation and management procedures were performed by the PA/NP under my supervision/collaboration.    Felisa Bonier, MD 08/20/11 [phone]

## 2011-08-21 ENCOUNTER — Emergency Department (HOSPITAL_COMMUNITY)
Admission: EM | Admit: 2011-08-21 | Discharge: 2011-08-21 | Discharge status: Against Medical Advice | Payer: Self-pay | Attending: Emergency Medicine | Admitting: Emergency Medicine

## 2011-08-21 ENCOUNTER — Encounter (HOSPITAL_COMMUNITY): Payer: Self-pay | Admitting: *Deleted

## 2011-08-21 DIAGNOSIS — I739 Peripheral vascular disease, unspecified: Secondary | ICD-10-CM | POA: Insufficient documentation

## 2011-08-21 DIAGNOSIS — I10 Essential (primary) hypertension: Secondary | ICD-10-CM | POA: Insufficient documentation

## 2011-08-21 DIAGNOSIS — Z5321 Procedure and treatment not carried out due to patient leaving prior to being seen by health care provider: Secondary | ICD-10-CM

## 2011-08-21 DIAGNOSIS — F17200 Nicotine dependence, unspecified, uncomplicated: Secondary | ICD-10-CM | POA: Insufficient documentation

## 2011-08-21 DIAGNOSIS — E119 Type 2 diabetes mellitus without complications: Secondary | ICD-10-CM | POA: Insufficient documentation

## 2011-08-21 DIAGNOSIS — Z7982 Long term (current) use of aspirin: Secondary | ICD-10-CM | POA: Insufficient documentation

## 2011-08-21 DIAGNOSIS — Z5320 Procedure and treatment not carried out because of patient's decision for unspecified reasons: Secondary | ICD-10-CM | POA: Insufficient documentation

## 2011-08-21 NOTE — ED Provider Notes (Signed)
History     CSN: [id number] Arrival date & time: 08/21/2011 12:38 PM   First MD Initiated Contact with Patient 08/21/11 1312      Chief Complaint  Patient presents with  . Claudication    (Consider location/radiation/quality/duration/timing/severity/associated sxs/prior treatment) HPI  Past Medical History  Diagnosis Date  . Hypertension   . Diabetes mellitus   . Substance abuse   . Suicidal thoughts     Past Surgical History  Procedure Date  . Abdominal hysterectomy   . Cesarean section     x 2    History reviewed. No pertinent family history.  History  Substance Use Topics  . Smoking status: Current Everyday Smoker -- 0.5 packs/day    Types: Cigarettes  . Smokeless tobacco: Not on file  . Alcohol Use: No    OB History    Grav Para Term Preterm Abortions TAB SAB Ect Mult Living                  Review of Systems  Allergies  Codeine  Home Medications   Current Outpatient Rx  Name Route Sig Dispense Refill  . ASPIRIN 81 MG PO CHEW Oral Chew 81 mg by mouth daily.      Marland Kitchen HYDROCODONE-ACETAMINOPHEN 7.5-325 MG PO TABS Oral Take 1 tablet by mouth 2 (two) times daily as needed. Pain in back     . LISINOPRIL 5 MG PO TABS Oral Take 5 mg by mouth daily.      Marland Kitchen METFORMIN HCL 500 MG PO TABS Oral Take 500 mg by mouth 2 (two) times daily with a meal.      . IBUPROFEN 800 MG PO TABS Oral Take 800 mg by mouth every 6 (six) hours as needed. pain      BP 128/85  Pulse 92  Temp(Src) 98.5 F (36.9 C) (Oral)  Resp 18  Ht 5\' 9"  (1.753 m)  Wt 215 lb (97.523 kg)  BMI 31.75 kg/m2  SpO2 99%  Physical Exam  ED Course  Procedures (including critical care time)  Labs Reviewed - No data to display No results found.   1. Patient left without being seen       MDM  Patient left prior to being seen.  Unknown diagnosis.        Candis Musa, PA 08/21/11 1702

## 2011-08-21 NOTE — ED Notes (Signed)
Pt states she has to leave due to childcare issues. Pt states she will come back this evening. AMA form signed Burgess Amor notified.

## 2011-08-21 NOTE — ED Notes (Signed)
Pt c/o bumps to bottom of bilateral feet; pt states they started x 4 months ago and has gotten progressively worse; pt states the pain radiates up to her back and makes it painful to walk

## 2011-08-27 NOTE — ED Provider Notes (Signed)
Evaluation and management procedures were performed by the PA/NP under my supervision/collaboration.    Latreece Mochizuki D Ladiamond Gallina, MD 08/27/11 2120 

## 2011-09-29 ENCOUNTER — Emergency Department (HOSPITAL_COMMUNITY): Payer: Self-pay

## 2011-09-29 ENCOUNTER — Emergency Department (HOSPITAL_COMMUNITY)
Admission: EM | Admit: 2011-09-29 | Discharge: 2011-09-29 | Disposition: A | Discharge status: Home / Self Care | Payer: Self-pay | Attending: Emergency Medicine | Admitting: Emergency Medicine

## 2011-09-29 ENCOUNTER — Encounter (HOSPITAL_COMMUNITY): Payer: Self-pay | Admitting: *Deleted

## 2011-09-29 DIAGNOSIS — R110 Nausea: Secondary | ICD-10-CM | POA: Insufficient documentation

## 2011-09-29 DIAGNOSIS — K5900 Constipation, unspecified: Secondary | ICD-10-CM | POA: Insufficient documentation

## 2011-09-29 DIAGNOSIS — Z7982 Long term (current) use of aspirin: Secondary | ICD-10-CM | POA: Insufficient documentation

## 2011-09-29 DIAGNOSIS — F17200 Nicotine dependence, unspecified, uncomplicated: Secondary | ICD-10-CM | POA: Insufficient documentation

## 2011-09-29 DIAGNOSIS — Z9079 Acquired absence of other genital organ(s): Secondary | ICD-10-CM | POA: Insufficient documentation

## 2011-09-29 DIAGNOSIS — I10 Essential (primary) hypertension: Secondary | ICD-10-CM | POA: Insufficient documentation

## 2011-09-29 DIAGNOSIS — R109 Unspecified abdominal pain: Secondary | ICD-10-CM | POA: Insufficient documentation

## 2011-09-29 DIAGNOSIS — M25569 Pain in unspecified knee: Secondary | ICD-10-CM | POA: Insufficient documentation

## 2011-09-29 DIAGNOSIS — M549 Dorsalgia, unspecified: Secondary | ICD-10-CM | POA: Insufficient documentation

## 2011-09-29 DIAGNOSIS — E119 Type 2 diabetes mellitus without complications: Secondary | ICD-10-CM | POA: Insufficient documentation

## 2011-09-29 LAB — COMPREHENSIVE METABOLIC PANEL
BUN: 11 mg/dL (ref 6–23)
CO2: 27 mEq/L (ref 19–32)
Chloride: 106 mEq/L (ref 96–112)
Creatinine, Ser: 0.92 mg/dL (ref 0.50–1.10)
GFR calc non Af Amer: 77 mL/min — ABNORMAL LOW (ref >90)
Glucose, Bld: 125 mg/dL — ABNORMAL HIGH (ref 70–99)
Total Bilirubin: 0.2 mg/dL — ABNORMAL LOW (ref 0.3–1.2)

## 2011-09-29 LAB — DIFFERENTIAL
Lymphocytes Relative: 31 % (ref 12–46)
Monocytes Absolute: 0.9 10*3/uL (ref 0.1–1.0)
Monocytes Relative: 12 % (ref 3–12)
Neutro Abs: 4.1 10*3/uL (ref 1.7–7.7)

## 2011-09-29 LAB — CBC
HCT: 42.4 % (ref 36.0–46.0)
Hemoglobin: 13.8 g/dL (ref 12.0–15.0)
MCHC: 32.5 g/dL (ref 30.0–36.0)
WBC: 7.5 10*3/uL (ref 4.0–10.5)

## 2011-09-29 LAB — GLUCOSE, CAPILLARY: Glucose-Capillary: 108 mg/dL — ABNORMAL HIGH (ref 70–99)

## 2011-09-29 MED ORDER — OXYCODONE-ACETAMINOPHEN 5-325 MG PO TABS: 1.0000 | ORAL_TABLET | ORAL | Status: AC | PRN

## 2011-09-29 MED ORDER — ONDANSETRON 4 MG PO TBDP
4.0000 mg | ORAL_TABLET | Freq: Once | ORAL | Status: AC
  Administered 2011-09-29: 4 mg via ORAL
  Filled 2011-09-29: qty 1

## 2011-09-29 MED ORDER — PROMETHAZINE HCL 25 MG PO TABS: 25.0000 mg | ORAL_TABLET | Freq: Four times a day (QID) | ORAL | Status: DC | PRN

## 2011-09-29 MED ORDER — GLIPIZIDE 10 MG PO TABS: 5.0000 mg | ORAL_TABLET | Freq: Two times a day (BID) | ORAL | Status: DC

## 2011-09-29 NOTE — ED Notes (Signed)
Stormed out of ed stating she did not have time to wait, states she has to get home to get her child off the bus. Refused to sign ama form.

## 2011-09-29 NOTE — ED Provider Notes (Signed)
History     CSN: [id number] Arrival date & time: 09/29/2011 10:54 AM   First MD Initiated Contact with Patient 09/29/11 1303      Chief Complaint  Patient presents with  . Abdominal Pain  . Knee Pain  . Nausea    (Consider location/radiation/quality/duration/timing/severity/associated sxs/prior treatment) Patient is a 40 y.o. female presenting with abdominal pain and knee pain. The history is provided by the patient.  Abdominal Pain The primary symptoms of the illness include abdominal pain and nausea. The primary symptoms of the illness do not include vomiting. The current episode started 2 days ago. The onset of the illness was gradual.  The illness is associated with eating. Additional symptoms associated with the illness include constipation and back pain.  Knee Pain Associated symptoms include abdominal pain. Pertinent negatives include no chest pain.   patient states that she has had epigastric abdominal pain for the last few days. Nausea without vomiting. No fevers. No diarrhea. She states she has had some mild constipation. She states is worse after she takes her metformin. She states that she has told her doctor about this but won't change anything. No fevers. No dysuria. No actual vomiting. No relief with her hydrocodone. No trauma. She states it is improved somewhat taking half of a metformin. She also states she's been having right knee pain. It is uncommon for couple weeks. No trauma. It is worse with walking. No numbness or weakness. She states she's never had pain like this before. She states she took hydrocodone that she had left over, but she now ran out of it.  Past Medical History  Diagnosis Date  . Hypertension   . Diabetes mellitus   . Substance abuse   . Suicidal thoughts     Past Surgical History  Procedure Date  . Abdominal hysterectomy   . Cesarean section     x 2    History reviewed. No pertinent family history.  History  Substance Use Topics  .  Smoking status: Current Everyday Smoker -- 0.5 packs/day    Types: Cigarettes  . Smokeless tobacco: Not on file  . Alcohol Use: No    OB History    Grav Para Term Preterm Abortions TAB SAB Ect Mult Living                  Review of Systems  Constitutional: Negative for activity change.  Respiratory: Negative for apnea.   Cardiovascular: Negative for chest pain.  Gastrointestinal: Positive for nausea, abdominal pain and constipation. Negative for vomiting.  Genitourinary: Negative for flank pain.  Musculoskeletal: Positive for back pain. Negative for joint swelling.  Neurological: Negative for weakness and numbness.  Psychiatric/Behavioral: Negative for confusion.    Allergies  Codeine  Home Medications   Current Outpatient Rx  Name Route Sig Dispense Refill  . ASPIRIN 81 MG PO CHEW Oral Chew 81 mg by mouth daily.      . IBUPROFEN 800 MG PO TABS Oral Take 800 mg by mouth every 6 (six) hours as needed. pain    . LISINOPRIL 5 MG PO TABS Oral Take 5 mg by mouth daily.      Marland Kitchen GLIPIZIDE 10 MG PO TABS Oral Take 0.5 tablets (5 mg total) by mouth 2 (two) times daily before a meal. 30 tablet 0  . HYDROCODONE-ACETAMINOPHEN 7.5-325 MG PO TABS Oral Take 1 tablet by mouth 2 (two) times daily as needed. Pain in back     . PROMETHAZINE HCL 25 MG PO TABS  Oral Take 1 tablet (25 mg total) by mouth every 6 (six) hours as needed for nausea. 10 tablet 0    BP 131/76  Pulse 72  Temp(Src) 98.4 F (36.9 C) (Oral)  Resp 19  Ht 5\' 9"  (1.753 m)  Wt 207 lb 4.8 oz (94.031 kg)  BMI 30.61 kg/m2  SpO2 99%  Physical Exam  Nursing note and vitals reviewed. Constitutional: She is oriented to person, place, and time. She appears well-developed and well-nourished.  HENT:  Head: Normocephalic and atraumatic.  Eyes: EOM are normal. Pupils are equal, round, and reactive to light.  Neck: Normal range of motion. Neck supple.  Cardiovascular: Normal rate, regular rhythm and normal heart sounds.   No  murmur heard. Pulmonary/Chest: Effort normal and breath sounds normal. No respiratory distress. She has no wheezes. She has no rales.  Abdominal: Soft. Bowel sounds are normal. She exhibits no distension. There is no tenderness. There is no rebound and no guarding.  Musculoskeletal: Normal range of motion.       Mild tenderness and swelling to the right knee. Mild edema. No erythema. Neurovascularly intact distally.  Neurological: She is alert and oriented to person, place, and time. No cranial nerve deficit.  Skin: Skin is warm and dry.  Psychiatric: She has a normal mood and affect. Her speech is normal.    ED Course  Procedures (including critical care time)  Labs Reviewed  GLUCOSE, CAPILLARY - Abnormal; Notable for the following:    Glucose-Capillary 108 (*)    All other components within normal limits  CBC - Abnormal; Notable for the following:    RDW 16.0 (*)    All other components within normal limits  COMPREHENSIVE METABOLIC PANEL - Abnormal; Notable for the following:    Glucose, Bld 125 (*)    Albumin 3.3 (*)    Total Bilirubin 0.2 (*)    GFR calc non Af Amer 77 (*)    GFR calc Af Amer 89 (*)    All other components within normal limits  DIFFERENTIAL   Dg Knee Complete 4 Views Right  09/29/2011  *RADIOLOGY REPORT*  Clinical Data: Knee pain for 1 month  RIGHT KNEE - COMPLETE 4+ VIEW  Comparison: None.  Findings: No fracture or dislocation.  No soft tissue abnormality. No radiopaque foreign body.  Trace suprapatellar effusion noted.  IMPRESSION: No acute finding.  Trace suprapatellar effusion.  Original Report Authenticated By: Harrel Lemon, M.D.     1. Abdominal pain   2. Nausea   3. Knee pain       MDM  Patient's had abdominal pain and nauseousness. He may be related to her metformin. Will be stopped and glipizide will be started after consultation with the pharmacist. The pain benign exam no trauma. X-ray is unremarkable. Followup with  associated.        Juliet Rude. Rubin Payor, MD 09/29/11 [phone]

## 2011-09-29 NOTE — ED Notes (Signed)
Pt c/o abd pain and right knee pain. Pt denies vomiting and diarrhea but states she is nauseated after meals. Pt denies injury to right knee.

## 2011-10-27 ENCOUNTER — Emergency Department (HOSPITAL_COMMUNITY)
Admission: EM | Admit: 2011-10-27 | Discharge: 2011-10-27 | Disposition: A | Discharge status: Home / Self Care | Payer: Self-pay | Attending: Emergency Medicine | Admitting: Emergency Medicine

## 2011-10-27 ENCOUNTER — Encounter (HOSPITAL_COMMUNITY): Payer: Self-pay | Admitting: Emergency Medicine

## 2011-10-27 DIAGNOSIS — E119 Type 2 diabetes mellitus without complications: Secondary | ICD-10-CM | POA: Insufficient documentation

## 2011-10-27 DIAGNOSIS — Z79899 Other long term (current) drug therapy: Secondary | ICD-10-CM | POA: Insufficient documentation

## 2011-10-27 DIAGNOSIS — F17200 Nicotine dependence, unspecified, uncomplicated: Secondary | ICD-10-CM | POA: Insufficient documentation

## 2011-10-27 DIAGNOSIS — M25469 Effusion, unspecified knee: Secondary | ICD-10-CM | POA: Insufficient documentation

## 2011-10-27 DIAGNOSIS — Z7982 Long term (current) use of aspirin: Secondary | ICD-10-CM | POA: Insufficient documentation

## 2011-10-27 DIAGNOSIS — I10 Essential (primary) hypertension: Secondary | ICD-10-CM | POA: Insufficient documentation

## 2011-10-27 DIAGNOSIS — M25569 Pain in unspecified knee: Secondary | ICD-10-CM | POA: Insufficient documentation

## 2011-10-27 MED ORDER — HYDROCODONE-ACETAMINOPHEN 5-325 MG PO TABS: 1.0000 | ORAL_TABLET | Freq: Four times a day (QID) | ORAL | Status: AC | PRN

## 2011-10-27 MED ORDER — IBUPROFEN 800 MG PO TABS
800.0000 mg | ORAL_TABLET | Freq: Once | ORAL | Status: AC
  Administered 2011-10-27: 800 mg via ORAL
  Filled 2011-10-27: qty 1

## 2011-10-27 MED ORDER — HYDROCODONE-ACETAMINOPHEN 5-325 MG PO TABS
1.0000 | ORAL_TABLET | Freq: Once | ORAL | Status: AC
  Administered 2011-10-27: 1 via ORAL
  Filled 2011-10-27: qty 1

## 2011-10-27 NOTE — ED Provider Notes (Signed)
History     CSN: [id number]  Arrival date & time 10/27/11  8295   First MD Initiated Contact with Patient 10/27/11 1016      Chief Complaint  Patient presents with  . Knee Pain    (Consider location/radiation/quality/duration/timing/severity/associated sxs/prior treatment) HPI Comments: States she was here 2 weeks ago for R knee pain.    Given neoprene sleeve only.  States she has no knee immobilizer or crutches and was not referred to an orthopedist.  Patient is a 40 y.o. female presenting with knee pain. The history is provided by the patient. No language interpreter was used.  Knee Pain This is a new problem. The problem occurs constantly. The problem has been unchanged. The symptoms are aggravated by walking and standing. She has tried nothing for the symptoms.    Past Medical History  Diagnosis Date  . Hypertension   . Diabetes mellitus   . Substance abuse   . Suicidal thoughts     Past Surgical History  Procedure Date  . Abdominal hysterectomy   . Cesarean section     x 2    Family History  Problem Relation Age of Onset  . Diabetes Other     History  Substance Use Topics  . Smoking status: Current Everyday Smoker -- 0.5 packs/day for 25 years    Types: Cigarettes  . Smokeless tobacco: Never Used  . Alcohol Use: No    OB History    Grav Para Term Preterm Abortions TAB SAB Ect Mult Living   5 2 2  3 1 2   2       Review of Systems  Musculoskeletal:       Knee pain  All other systems reviewed and are negative.    Allergies  Codeine and Doxycycline  Home Medications   Current Outpatient Rx  Name Route Sig Dispense Refill  . ASPIRIN 81 MG PO CHEW Oral Chew 81 mg by mouth daily.      Marland Kitchen GLIPIZIDE 10 MG PO TABS Oral Take 0.5 tablets (5 mg total) by mouth 2 (two) times daily before a meal. 30 tablet 0  . HYDROCODONE-ACETAMINOPHEN 7.5-325 MG PO TABS Oral Take 1 tablet by mouth 2 (two) times daily as needed. Pain in back     . IBUPROFEN 800 MG PO  TABS Oral Take 800 mg by mouth every 6 (six) hours as needed. pain    . LISINOPRIL 5 MG PO TABS Oral Take 5 mg by mouth daily.        BP 125/81  Pulse 88  Temp(Src) 98.9 F (37.2 C) (Oral)  Resp 19  Ht 5\' 9"  (1.753 m)  Wt 212 lb 3.2 oz (96.253 kg)  BMI 31.34 kg/m2  SpO2 100%  Physical Exam  Nursing note and vitals reviewed. Constitutional: She is oriented to person, place, and time. She appears well-developed and well-nourished. No distress.  HENT:  Head: Normocephalic and atraumatic.  Eyes: EOM are normal.  Neck: Normal range of motion.  Cardiovascular: Normal rate, regular rhythm and normal heart sounds.   Pulmonary/Chest: Effort normal and breath sounds normal.  Abdominal: Soft. She exhibits no distension. There is no tenderness.  Musculoskeletal:       Right knee: She exhibits decreased range of motion and effusion. She exhibits no ecchymosis, no deformity, no laceration, no erythema, normal alignment, no LCL laxity and normal patellar mobility.  Neurological: She is alert and oriented to person, place, and time.  Skin: Skin is warm and dry.  Psychiatric: She has a normal mood and affect. Judgment normal.    ED Course  Procedures (including critical care time)  Labs Reviewed - No data to display No results found.   No diagnosis found.    MDM          Worthy Rancher, PA 10/27/11 1117  Worthy Rancher, PA 10/27/11 [phone]

## 2011-10-27 NOTE — ED Notes (Signed)
Patient was seen here three weeks ago for right knee pain. Per patient knee pain has not improved. Per patient x-ray was done showing fluid on knee.

## 2011-10-27 NOTE — ED Provider Notes (Signed)
Medical screening examination/treatment/procedure(s) were performed by non-physician practitioner and as supervising physician I was immediately available for consultation/collaboration.  Caedon Bond S. Christoper Bushey, MD 10/27/11 2124 

## 2011-11-11 ENCOUNTER — Encounter (HOSPITAL_COMMUNITY): Payer: Self-pay | Admitting: Dietician

## 2011-11-11 NOTE — Progress Notes (Signed)
[facility] Diabetes Class Completion  Date:November 11, 2011  Time: 6:30 PM  Pt attended [facility]'s Diabetes Class on November 11, 2011.   Patient was educated on the following topics: carbohydrate metabolism in relation to diabetes, sources of carbohydrate, carbohydrate counting, meal planning strategies, food label reading, and portion control.   Raneisha Bress A. Kayan, RD, LDN Date:November 11, 2011 Time: 6:30 PM 

## 2011-11-22 ENCOUNTER — Emergency Department (HOSPITAL_COMMUNITY): Payer: Self-pay

## 2011-11-22 ENCOUNTER — Other Ambulatory Visit: Payer: Self-pay

## 2011-11-22 ENCOUNTER — Emergency Department (HOSPITAL_COMMUNITY)
Admission: EM | Admit: 2011-11-22 | Discharge: 2011-11-22 | Disposition: A | Discharge status: Home / Self Care | Payer: Self-pay | Attending: Emergency Medicine | Admitting: Emergency Medicine

## 2011-11-22 ENCOUNTER — Encounter (HOSPITAL_COMMUNITY): Payer: Self-pay

## 2011-11-22 DIAGNOSIS — Z7982 Long term (current) use of aspirin: Secondary | ICD-10-CM | POA: Insufficient documentation

## 2011-11-22 DIAGNOSIS — F17200 Nicotine dependence, unspecified, uncomplicated: Secondary | ICD-10-CM | POA: Insufficient documentation

## 2011-11-22 DIAGNOSIS — IMO0001 Reserved for inherently not codable concepts without codable children: Secondary | ICD-10-CM | POA: Insufficient documentation

## 2011-11-22 DIAGNOSIS — J069 Acute upper respiratory infection, unspecified: Secondary | ICD-10-CM

## 2011-11-22 DIAGNOSIS — I10 Essential (primary) hypertension: Secondary | ICD-10-CM | POA: Insufficient documentation

## 2011-11-22 DIAGNOSIS — R079 Chest pain, unspecified: Secondary | ICD-10-CM | POA: Insufficient documentation

## 2011-11-22 DIAGNOSIS — Z9079 Acquired absence of other genital organ(s): Secondary | ICD-10-CM | POA: Insufficient documentation

## 2011-11-22 DIAGNOSIS — I440 Atrioventricular block, first degree: Secondary | ICD-10-CM | POA: Insufficient documentation

## 2011-11-22 DIAGNOSIS — E119 Type 2 diabetes mellitus without complications: Secondary | ICD-10-CM | POA: Insufficient documentation

## 2011-11-22 LAB — CBC
MCH: 26.8 pg (ref 26.0–34.0)
Platelets: 205 10*3/uL (ref 150–400)
RBC: 5.3 MIL/uL — ABNORMAL HIGH (ref 3.87–5.11)
WBC: 8.7 10*3/uL (ref 4.0–10.5)

## 2011-11-22 LAB — DIFFERENTIAL
Basophils Absolute: 0 10*3/uL (ref 0.0–0.1)
Eosinophils Absolute: 0.2 10*3/uL (ref 0.0–0.7)
Lymphocytes Relative: 33 % (ref 12–46)
Lymphs Abs: 2.9 10*3/uL (ref 0.7–4.0)
Neutrophils Relative %: 55 % (ref 43–77)

## 2011-11-22 LAB — BASIC METABOLIC PANEL
GFR calc non Af Amer: 80 mL/min — ABNORMAL LOW (ref >90)
Glucose, Bld: 117 mg/dL — ABNORMAL HIGH (ref 70–99)
Potassium: 3.8 mEq/L (ref 3.5–5.1)
Sodium: 136 mEq/L (ref 135–145)

## 2011-11-22 LAB — TROPONIN I: Troponin I: <0.3 ng/mL (ref <0.30)

## 2011-11-22 NOTE — ED Notes (Signed)
Pt reports woke up this morning around 1045 with pain in left chest, left axillary area, and numbness to left arm/hand.  Pt denies any n/v or SOB.  Pain worse with movement and palpation.  Denies any injury, any heavy lifting or coughing.

## 2011-11-22 NOTE — ED Provider Notes (Signed)
This chart was scribed for Gerhard Munch, MD by Williemae Natter. The patient was seen in room APA09/APA09 at 12:04 PM.  CSN: [id number]  Arrival date & time 11/22/11  1059   First MD Initiated Contact with Patient 11/22/11 1203      Chief Complaint  Patient presents with  . Chest Pain    (Consider location/radiation/quality/duration/timing/severity/associated sxs/prior treatment) HPI Katrina Good is a 41 y.o. female who presents to the Emergency Department complaining of cough and chest pain from left axillary area to to left breast. Pain started this morning (~hr pta) when it woke her from sleep and has been constant since onset. Pt reports having a cough yesterday starting yesterday. Pt denies any light headedness, difficulty breathing, vomiting, nausea, cold, or congestion. Pt has a history of hypertension and diabetes.   Past Medical History  Diagnosis Date  . Hypertension   . Diabetes mellitus   . Substance abuse     pt denies  . Suicidal thoughts     pt denies    Past Surgical History  Procedure Date  . Abdominal hysterectomy   . Cesarean section     x 2    Family History  Problem Relation Age of Onset  . Diabetes Other     History  Substance Use Topics  . Smoking status: Current Everyday Smoker -- 0.5 packs/day for 25 years    Types: Cigarettes  . Smokeless tobacco: Never Used  . Alcohol Use: Yes     ocsional    OB History    Grav Para Term Preterm Abortions TAB SAB Ect Mult Living   5 2 2  3 1 2   2       Review of Systems  Cardiovascular: Positive for chest pain.  Musculoskeletal: Positive for myalgias.  All other systems reviewed and are negative.    Allergies  Codeine and Doxycycline  Home Medications   Current Outpatient Rx  Name Route Sig Dispense Refill  . ASPIRIN 81 MG PO CHEW Oral Chew 81 mg by mouth daily.      Marland Kitchen GLIPIZIDE ER 2.5 MG PO TB24 Oral Take 2.5 mg by mouth daily.      Marland Kitchen LISINOPRIL 5 MG PO TABS Oral Take 5 mg by mouth  daily.      Cardiac Monitor   Rate: 88 bpm  Rhythm:  Pulse oximetry on room air is 100%. Normal by my interpretation. Cardiac monitor 95 sr, normal   Date: 11/22/2011  Rate: 88  Rhythm: normal sinus rhythm  QRS Axis: normal  Intervals: normal  ST/T Wave abnormalities: normal  Conduction Disutrbances:first-degree A-V block   Narrative Interpretation:   Old EKG Reviewed: none available J-point elevation ABNORMAL ECG  CXr: reviewed by me, no acute findings.  BP 141/96  Pulse 88  Temp(Src) 98.3 F (36.8 C) (Oral)  Resp 22  Ht 5\' 9"  (1.753 m)  Wt 212 lb (96.163 kg)  BMI 31.31 kg/m2  SpO2 100%  Physical Exam  Nursing note and vitals reviewed. Constitutional: She is oriented to person, place, and time. She appears well-developed and well-nourished.  HENT:  Head: Normocephalic and atraumatic.  Neck: Normal range of motion. Neck supple.  Cardiovascular: Normal rate, regular rhythm and normal heart sounds.   Pulmonary/Chest: Effort normal and breath sounds normal.  Neurological: She is alert and oriented to person, place, and time.  Skin: Skin is warm and dry.  Psychiatric: She has a normal mood and affect. Her behavior is normal.    ED  Course  Procedures (including critical care time)   Labs Reviewed  CBC  DIFFERENTIAL  BASIC METABOLIC PANEL  TROPONIN I   No results found.   No diagnosis found.    MDM  I personally performed the services described in this documentation, which was scribed in my presence. The recorded information has been reviewed and considered.  This generally well 41 year old female presents with cough, and chest pain.  On exam, the patient is in no distress.  She notes near total resolution of her chest pain spontaneously prior to my evaluation and is in no distress on exam.  ECG, CXR both reassuring.  Patient d/c in stable condition w URI   Gerhard Munch, MD 11/22/11 1306

## 2012-01-01 ENCOUNTER — Encounter (HOSPITAL_COMMUNITY): Payer: Self-pay | Admitting: *Deleted

## 2012-01-01 ENCOUNTER — Emergency Department (HOSPITAL_COMMUNITY)
Admission: EM | Admit: 2012-01-01 | Discharge: 2012-01-01 | Disposition: A | Discharge status: Home / Self Care | Payer: Self-pay | Attending: Emergency Medicine | Admitting: Emergency Medicine

## 2012-01-01 ENCOUNTER — Other Ambulatory Visit: Payer: Self-pay

## 2012-01-01 DIAGNOSIS — R1915 Other abnormal bowel sounds: Secondary | ICD-10-CM | POA: Insufficient documentation

## 2012-01-01 DIAGNOSIS — Z79899 Other long term (current) drug therapy: Secondary | ICD-10-CM | POA: Insufficient documentation

## 2012-01-01 DIAGNOSIS — R000 Tachycardia, unspecified: Secondary | ICD-10-CM | POA: Insufficient documentation

## 2012-01-01 DIAGNOSIS — Z7982 Long term (current) use of aspirin: Secondary | ICD-10-CM | POA: Insufficient documentation

## 2012-01-01 DIAGNOSIS — F1410 Cocaine abuse, uncomplicated: Secondary | ICD-10-CM | POA: Insufficient documentation

## 2012-01-01 DIAGNOSIS — E119 Type 2 diabetes mellitus without complications: Secondary | ICD-10-CM | POA: Insufficient documentation

## 2012-01-01 DIAGNOSIS — R1110 Vomiting, unspecified: Secondary | ICD-10-CM | POA: Insufficient documentation

## 2012-01-01 DIAGNOSIS — H11419 Vascular abnormalities of conjunctiva, unspecified eye: Secondary | ICD-10-CM | POA: Insufficient documentation

## 2012-01-01 DIAGNOSIS — R197 Diarrhea, unspecified: Secondary | ICD-10-CM | POA: Insufficient documentation

## 2012-01-01 DIAGNOSIS — I10 Essential (primary) hypertension: Secondary | ICD-10-CM | POA: Insufficient documentation

## 2012-01-01 DIAGNOSIS — R4182 Altered mental status, unspecified: Secondary | ICD-10-CM | POA: Insufficient documentation

## 2012-01-01 DIAGNOSIS — R5381 Other malaise: Secondary | ICD-10-CM | POA: Insufficient documentation

## 2012-01-01 DIAGNOSIS — R5383 Other fatigue: Secondary | ICD-10-CM | POA: Insufficient documentation

## 2012-01-01 DIAGNOSIS — F1910 Other psychoactive substance abuse, uncomplicated: Secondary | ICD-10-CM

## 2012-01-01 LAB — CBC
Hemoglobin: 14.9 g/dL (ref 12.0–15.0)
MCH: 26.5 pg (ref 26.0–34.0)
MCHC: 32.5 g/dL (ref 30.0–36.0)
Platelets: 205 10*3/uL (ref 150–400)
RDW: 15.5 % (ref 11.5–15.5)

## 2012-01-01 LAB — RAPID URINE DRUG SCREEN, HOSP PERFORMED
Barbiturates: NOT DETECTED
Opiates: NOT DETECTED
Tetrahydrocannabinol: NOT DETECTED

## 2012-01-01 LAB — URINALYSIS, ROUTINE W REFLEX MICROSCOPIC
Hgb urine dipstick: NEGATIVE
Nitrite: NEGATIVE
Protein, ur: NEGATIVE mg/dL
Urobilinogen, UA: 0.2 mg/dL (ref 0.0–1.0)

## 2012-01-01 LAB — BASIC METABOLIC PANEL
Calcium: 9.4 mg/dL (ref 8.4–10.5)
GFR calc Af Amer: >90 mL/min (ref >90)
GFR calc non Af Amer: >90 mL/min (ref >90)
Glucose, Bld: 136 mg/dL — ABNORMAL HIGH (ref 70–99)
Sodium: 138 mEq/L (ref 135–145)

## 2012-01-01 MED ORDER — ONDANSETRON HCL 4 MG/2ML IJ SOLN
4.0000 mg | Freq: Once | INTRAMUSCULAR | Status: AC
  Administered 2012-01-01: 4 mg via INTRAVENOUS
  Filled 2012-01-01: qty 2

## 2012-01-01 MED ORDER — SODIUM CHLORIDE 0.9 % IV BOLUS (SEPSIS)
1000.0000 mL | Freq: Once | INTRAVENOUS | Status: AC
  Administered 2012-01-01: 1000 mL via INTRAVENOUS

## 2012-01-01 NOTE — ED Provider Notes (Signed)
History  Scribed for [employer]. Colon Branch, MD, the patient was seen in room APA07/APA07. This chart was scribed by Candelaria Stagers. The patient's care started at 7:21AM.    CSN: [id number]  Arrival date & time 01/01/12  0706   First MD Initiated Contact with Patient 01/01/12 [phone]      Chief Complaint  Patient presents with  . Altered Mental Status     Patient is a 41 y.o. female presenting with altered mental status. The history is provided by the patient and the EMS personnel. The history is limited by the condition of the patient.  Altered Mental Status Chronicity: unknown. Episode onset: unknown. Episode frequency: unknown. Progression since onset: unknown. Associated symptoms comments: Reports vomiting and diarrhea. . The symptoms are relieved by nothing. She has tried nothing for the symptoms. The treatment provided no relief.   Katrina Good is a 41 y.o. female who was BIBA to the Emergency Department experiencing altered mental status.  EMS states that they were called out because the pt ws unresponsive.  On arrival EMS states they used ammonia on pt and she came around.  Pt is slow to respond and states that she does not remember what happened to her or why she is here.  She reports she is experiencing diarrhea and vomiting that started yesterday.  She denies chest pain and states that she has not eaten and feels weak.  She currently lives with her daughter.      Past Medical History  Diagnosis Date  . Hypertension   . Diabetes mellitus   . Substance abuse     pt denies  . Suicidal thoughts     pt denies    Past Surgical History  Procedure Date  . Abdominal hysterectomy   . Cesarean section     x 2    Family History  Problem Relation Age of Onset  . Diabetes Other     History  Substance Use Topics  . Smoking status: Current Everyday Smoker -- 0.5 packs/day for 25 years    Types: Cigarettes  . Smokeless tobacco: Never Used  . Alcohol Use: Yes     ocsional     OB History    Grav Para Term Preterm Abortions TAB SAB Ect Mult Living   5 2 2  3 1 2   2       Review of Systems  Gastrointestinal: Positive for vomiting and diarrhea.  Neurological: Positive for weakness.  Psychiatric/Behavioral: Positive for altered mental status.  All other systems reviewed and are negative.    Allergies  Codeine and Doxycycline  Home Medications   Current Outpatient Rx  Name Route Sig Dispense Refill  . ASPIRIN EC 81 MG PO TBEC Oral Take 81 mg by mouth daily.    Marland Kitchen GLIPIZIDE ER 2.5 MG PO TB24 Oral Take 2.5 mg by mouth daily.    Marland Kitchen GLIPIZIDE 10 MG PO TABS Oral Take 5 mg by mouth 2 (two) times daily before a meal.    . HYDROCORTISONE 5 MG PO TABS Oral Take 7.5 mg by mouth daily.    . IBUPROFEN 200 MG PO TABS Oral Take 400 mg by mouth every 6 (six) hours as needed. For pain    . LISINOPRIL 5 MG PO TABS Oral Take 5 mg by mouth daily.      Marland Kitchen NAPROXEN SODIUM 220 MG PO TABS Oral Take 220 mg by mouth every 8 (eight) hours as needed. For pain      BP  138/90  Pulse 98  Temp(Src) 99.2 F (37.3 C) (Oral)  Resp 16  SpO2 97%  Physical Exam  Nursing note and vitals reviewed. Constitutional: She appears well-developed and well-nourished.       Flat affect. Minimally communicative.   HENT:  Head: Normocephalic and atraumatic.  Eyes: Right eye exhibits no discharge. Left eye exhibits no discharge.       conjuctiva injected bilatterally   Neck: Normal range of motion. Neck supple.  Cardiovascular: Normal rate and regular rhythm.  Exam reveals no gallop and no friction rub.   No murmur heard.      Mildly tachycardic.   Pulmonary/Chest: Effort normal. She has no wheezes. She has no rales.  Abdominal: Soft. There is no tenderness.       Hyperactive bowel sounds.   Musculoskeletal: She exhibits no edema.  Skin: Skin is dry. No rash noted.  Psychiatric:       Flat affect.     ED Course  Procedures   DIAGNOSTIC STUDIES: Oxygen Saturation is 97% on room  air, normal by my interpretation.    COORDINATION OF CARE: 7:27AM Ordered: sodium chloride 0.9 % bolus 1,000 mL ; ondansetron (ZOFRAN) injection 4 mg ; CBC ; Basic metabolic panel ; Urinalysis, Routine w reflex microscopic ; Urine rapid drug screen (hosp performed) ; Ethanol   Results for orders placed during the hospital encounter of 01/01/12 (from the past 24 hour(s))  GLUCOSE, CAPILLARY     Status: Abnormal   Collection Time   01/01/12  7:16 AM      Component Value Range   Glucose-Capillary 122 (*) 70 - 99 (mg/dL)  CBC     Status: Abnormal   Collection Time   01/01/12  7:35 AM      Component Value Range   WBC 11.3 (*) 4.0 - 10.5 (K/uL)   RBC 5.63 (*) 3.87 - 5.11 (MIL/uL)   Hemoglobin 14.9  12.0 - 15.0 (g/dL)   HCT 96.0  45.4 - 09.8 (%)   MCV 81.5  78.0 - 100.0 (fL)   MCH 26.5  26.0 - 34.0 (pg)   MCHC 32.5  30.0 - 36.0 (g/dL)   RDW 11.9  14.7 - 82.9 (%)   Platelets 205  150 - 400 (K/uL)  BASIC METABOLIC PANEL     Status: Abnormal   Collection Time   01/01/12  7:35 AM      Component Value Range   Sodium 138  135 - 145 (mEq/L)   Potassium 3.9  3.5 - 5.1 (mEq/L)   Chloride 100  96 - 112 (mEq/L)   CO2 28  19 - 32 (mEq/L)   Glucose, Bld 136 (*) 70 - 99 (mg/dL)   BUN 18  6 - 23 (mg/dL)   Creatinine, Ser 5.62  0.50 - 1.10 (mg/dL)   Calcium 9.4  8.4 - 13.0 (mg/dL)   GFR calc non Af Amer >90  >90 (mL/min)   GFR calc Af Amer >90  >90 (mL/min)  ETHANOL     Status: Normal   Collection Time   01/01/12  7:35 AM      Component Value Range   Alcohol, Ethyl (B) <11  0 - 11 (mg/dL)  URINALYSIS, ROUTINE W REFLEX MICROSCOPIC     Status: Normal   Collection Time   01/01/12  8:09 AM      Component Value Range   Color, Urine YELLOW  YELLOW    APPearance CLEAR  CLEAR    Specific Gravity, Urine 1.020  1.005 - 1.030    pH 6.5  5.0 - 8.0    Glucose, UA NEGATIVE  NEGATIVE (mg/dL)   Hgb urine dipstick NEGATIVE  NEGATIVE    Bilirubin Urine NEGATIVE  NEGATIVE    Ketones, ur NEGATIVE  NEGATIVE (mg/dL)    Protein, ur NEGATIVE  NEGATIVE (mg/dL)   Urobilinogen, UA 0.2  0.0 - 1.0 (mg/dL)   Nitrite NEGATIVE  NEGATIVE    Leukocytes, UA NEGATIVE  NEGATIVE   URINE RAPID DRUG SCREEN (HOSP PERFORMED)     Status: Abnormal   Collection Time   01/01/12  8:09 AM      Component Value Range   Opiates NONE DETECTED  NONE DETECTED    Cocaine POSITIVE (*) NONE DETECTED    Benzodiazepines NONE DETECTED  NONE DETECTED    Amphetamines NONE DETECTED  NONE DETECTED    Tetrahydrocannabinol NONE DETECTED  NONE DETECTED    Barbiturates NONE DETECTED  NONE DETECTED      Labs Reviewed  GLUCOSE, CAPILLARY - Abnormal; Notable for the following:    Glucose-Capillary 122 (*)    All other components within normal limits    Date: 01/01/2012  0902  Rate:91  Rhythm: normal sinus rhythm  QRS Axis: normal  Intervals: normal  ST/T Wave abnormalities: normal  Conduction Disutrbances: none  Narrative Interpretation:   Old EKG Reviewed: none available    MDM  Patient brought in for altered level of consciousness at home. Glucose reading is normal. UDS shows + cocaine. EKG unremarkable. Given IVF. Has taken PO fluids. Pt stable in ED with no significant deterioration in condition.The patient appears reasonably screened and/or stabilized for discharge and I doubt any other medical condition or other [facility] requiring further screening, evaluation, or treatment in the ED at this time prior to discharge.  I personally performed the services described in this documentation, which was scribed in my presence. The recorded information has been reviewed and considered.  MDM Reviewed: nursing note and vitals Interpretation: labs and ECG         Nicoletta Dress. Colon Branch, MD 01/01/12 1031

## 2012-01-01 NOTE — Discharge Instructions (Signed)
Polysubstance Abuse When people abuse more than one drug or type of drug it is called polysubstance or polydrug abuse. For example, many smokers also drink alcohol. This is one form of polydrug abuse. Polydrug abuse also refers to the use of a drug to counteract an unpleasant effect produced by another drug. It may also be used to help with withdrawal from another drug. People who take stimulants may become agitated. Sometimes this agitation is countered with a tranquilizer. This helps protect against the unpleasant side effects. Polydrug abuse also refers to the use of different drugs at the same time.  Anytime drug use is interfering with normal living activities, it has become abuse. This includes problems with family and friends. Psychological dependence has developed when your mind tells you that the drug is needed. This is usually followed by physical dependence which has developed when continuing increases of drug are required to get the same feeling or "high". This is known as addiction or chemical dependency. A person's risk is much higher if there is a history of chemical dependency in the family. SIGNS OF CHEMICAL DEPENDENCY  You have been told by friends or family that drugs have become a problem.   You fight when using drugs.   You are having blackouts (not remembering what you do while using).   You feel sick from using drugs but continue using.   You lie about use or amounts of drugs (chemicals) used.   You need chemicals to get you going.   You are suffering in work performance or in school because of drug use.   You get sick from use of drugs but continue to use anyway.   You need drugs to relate to people or feel comfortable in social situations.   You use drugs to forget problems.  "Yes" answered to any of the above signs of chemical dependency indicates there are problems. The longer the use of drugs continues, the greater the problems will become. If there is a family  history of drug or alcohol use, it is best not to experiment with these drugs. Continual use leads to tolerance. After tolerance develops more of the drug is needed to get the same feeling. This is followed by addiction. With addiction, drugs become the most important part of life. It becomes more important to take drugs than participate in the other usual activities of life. This includes relating to friends and family. Addiction is followed by dependency. Dependency is a condition where drugs are now needed not just to get high, but to feel normal. Addiction cannot be cured but it can be stopped. This often requires outside help and the care of professionals. Treatment centers are listed in the yellow pages under: Cocaine, Narcotics, and Alcoholics Anonymous. Most hospitals and clinics can refer you to a specialized care center. Talk to your caregiver if you need help. Document Released: 06/09/2005 Document Revised: 06/30/2011 Document Reviewed: 10/18/2005 [facility] Patient Information 2012 [place], [place].

## 2012-01-01 NOTE — ED Notes (Signed)
Pt awake and more alert. When asked about drug use patient states that she used Cocaine a week ago. Cardiac monitor showing NSR.

## 2012-01-01 NOTE — ED Notes (Signed)
Arouses to verbal stimuli and follows commands. Pt slow to respond. Able to move all extremities. Pt states that she does not remember what happened to her. Cardiac monitor showing NSR. Pt had dried food and white crystals on her mouth and chest. Per EMS there was peanut butter and soda on the table beside the bed.

## 2012-01-01 NOTE — ED Notes (Signed)
Per EMS, called out for being unresponsive. Ems states when they used ammonia on pt she came around. Eye lids flutter on arrival to ED

## 2012-01-01 NOTE — ED Notes (Signed)
Pt was given a warm blanket. NAD noted at this time. 

## 2012-02-21 ENCOUNTER — Emergency Department (HOSPITAL_COMMUNITY)
Admission: EM | Admit: 2012-02-21 | Discharge: 2012-02-21 | Disposition: A | Discharge status: Home / Self Care | Payer: Self-pay | Attending: Emergency Medicine | Admitting: Emergency Medicine

## 2012-02-21 ENCOUNTER — Encounter (HOSPITAL_COMMUNITY): Payer: Self-pay

## 2012-02-21 ENCOUNTER — Emergency Department (HOSPITAL_COMMUNITY): Payer: Self-pay

## 2012-02-21 DIAGNOSIS — M25579 Pain in unspecified ankle and joints of unspecified foot: Secondary | ICD-10-CM | POA: Insufficient documentation

## 2012-02-21 DIAGNOSIS — E119 Type 2 diabetes mellitus without complications: Secondary | ICD-10-CM | POA: Insufficient documentation

## 2012-02-21 DIAGNOSIS — M65979 Unspecified synovitis and tenosynovitis, unspecified ankle and foot: Secondary | ICD-10-CM | POA: Insufficient documentation

## 2012-02-21 DIAGNOSIS — I10 Essential (primary) hypertension: Secondary | ICD-10-CM | POA: Insufficient documentation

## 2012-02-21 DIAGNOSIS — M659 Synovitis and tenosynovitis, unspecified: Secondary | ICD-10-CM | POA: Insufficient documentation

## 2012-02-21 DIAGNOSIS — Z79899 Other long term (current) drug therapy: Secondary | ICD-10-CM | POA: Insufficient documentation

## 2012-02-21 MED ORDER — HYDROCODONE-ACETAMINOPHEN 5-325 MG PO TABS: 1.0000 | ORAL_TABLET | ORAL | Status: AC | PRN

## 2012-02-21 NOTE — ED Notes (Signed)
Complain of pain in left  Foot. No known injury

## 2012-02-21 NOTE — ED Provider Notes (Signed)
History     CSN: [id number]  Arrival date & time 02/21/12  1051   None     Chief Complaint  Patient presents with  . Foot Pain    HPI Katrina Good is a 41 y.o. female who presents to the ED for foot/ankle pain. The patient woke with the pain this morning wit the pain in her left ankle and had difficulty with weight bearing. She rates the pain a severe. The pain is more on the lateral aspect of the foot and ankle. She does not remember any injury but yesterday did a lot of cleaning and moping. The history was provided by the patient.  Past Medical History  Diagnosis Date  . Hypertension   . Diabetes mellitus   . Substance abuse     pt denies  . Suicidal thoughts     pt denies    Past Surgical History  Procedure Date  . Abdominal hysterectomy   . Cesarean section     x 2    Family History  Problem Relation Age of Onset  . Diabetes Other     History  Substance Use Topics  . Smoking status: Current Everyday Smoker -- 0.5 packs/day for 25 years    Types: Cigarettes  . Smokeless tobacco: Never Used  . Alcohol Use: Yes     ocsional    OB History    Grav Para Term Preterm Abortions TAB SAB Ect Mult Living   5 2 2  3 1 2   2       Review of Systems  Constitutional: Negative for fever and chills.  HENT: Negative.   Eyes: Negative.   Respiratory: Negative.   Cardiovascular: Negative.   Genitourinary: Negative for frequency and difficulty urinating.  Musculoskeletal: Positive for joint swelling and gait problem (due to pain).       Pain left ankle and foot.  Skin: Negative.   Neurological: Negative for light-headedness and headaches.    Allergies  Codeine and Doxycycline  Home Medications   Current Outpatient Rx  Name Route Sig Dispense Refill  . ASPIRIN EC 81 MG PO TBEC Oral Take 81 mg by mouth daily.    Marland Kitchen GLIPIZIDE ER 2.5 MG PO TB24 Oral Take 2.5 mg by mouth daily.    Marland Kitchen GLIPIZIDE 10 MG PO TABS Oral Take 5 mg by mouth 2 (two) times daily before a meal.     . HYDROCORTISONE 5 MG PO TABS Oral Take 7.5 mg by mouth daily.    . IBUPROFEN 200 MG PO TABS Oral Take 400 mg by mouth every 6 (six) hours as needed. For pain    . LISINOPRIL 5 MG PO TABS Oral Take 5 mg by mouth daily.      Marland Kitchen NAPROXEN SODIUM 220 MG PO TABS Oral Take 220 mg by mouth every 8 (eight) hours as needed. For pain      BP 136/92  Pulse 79  Temp(Src) 98.3 F (36.8 C) (Oral)  Resp 20  Ht 5\' 9"  (1.753 m)  SpO2 99%  Physical Exam  Nursing note and vitals reviewed. Constitutional: She is oriented to person, place, and time. She appears well-developed and well-nourished. No distress.  HENT:  Head: Normocephalic and atraumatic.  Eyes: EOM are normal.  Neck: Neck supple.  Pulmonary/Chest: Effort normal.  Abdominal: Soft. There is no tenderness.  Musculoskeletal:       Tender with palpation and passive range of motion left lateral foot and ankle. Minimal swelling noted.  Neurological:  She is alert and oriented to person, place, and time. No cranial nerve deficit.       Pedal pulses present and strong. Adequate circulation.  Skin: Skin is warm and dry.  Psychiatric: She has a normal mood and affect. Her behavior is normal. Judgment and thought content normal.    ED Course  Procedures   Labs Reviewed - No data to display Dg Foot Complete Left  02/21/2012  *RADIOLOGY REPORT*  Clinical Data: Dorsal left foot pain.  No known injury.  LEFT FOOT - COMPLETE 3+ VIEW  Comparison: 11/14/2009.  Findings: Interval healing of the previously demonstrated fourth proximal phalanx fracture.  Stable accessory ossicles adjacent to the cuboid.  IMPRESSION: No acute abnormality.  Original Report Authenticated By: Darrol Angel, M.D.   Assessment: tendonitis left ankle  Plan:  Norco for pain RX   Ice/elevate   ASO/Crutches   Follow up with PCP or return here if symptoms worsen.       I have reviewed this patient's vital signs, nurses notes, appropriate labs and imaging.     MDM           Janne Napoleon, NP 02/21/12 1249

## 2012-02-21 NOTE — ED Provider Notes (Signed)
Medical screening examination/treatment/procedure(s) were performed by non-physician practitioner and as supervising physician I was immediately available for consultation/collaboration.  Khaiden Segreto S. Xander Jutras, MD 02/21/12 2212 

## 2012-02-21 NOTE — Discharge Instructions (Signed)
Ankle Pain Ankle pain is a common symptom. The bones, cartilage, tendons, and muscles of the ankle joint perform a lot of work each day. The ankle joint holds your body weight and allows you to move around. Ankle pain can occur on either side or back of 1 or both ankles. Ankle pain may be sharp and burning or dull and aching. There may be tenderness, stiffness, redness, or warmth around the ankle. The pain occurs more often when a person walks or puts pressure on the ankle. CAUSES  There are many reasons ankle pain can develop. It is important to work with your caregiver to identify the cause since many conditions can impact the bones, cartilage, muscles, and tendons. Causes for ankle pain include:  Injury, including a break (fracture), sprain, or strain often due to a fall, sports, or a high-impact activity.   Swelling (inflammation) of a tendon (tendonitis).   Achilles tendon rupture.   Ankle instability after repeated sprains and strains.   Poor foot alignment.   Pressure on a nerve (tarsal tunnel syndrome).   Arthritis in the ankle or the lining of the ankle.   Crystal formation in the ankle (gout or pseudogout).  DIAGNOSIS  A diagnosis is based on your medical history, your symptoms, results of your physical exam, and results of diagnostic tests. Diagnostic tests may include X-ray exams or a computerized magnetic scan (magnetic resonance imaging, MRI). TREATMENT  Treatment will depend on the cause of your ankle pain and may include:  Keeping pressure off the ankle and limiting activities.   Using crutches or other walking support (a cane or brace).   Using rest, ice, compression, and elevation.   Participating in physical therapy or home exercises.   Wearing shoe inserts or special shoes.   Losing weight.   Taking medications to reduce pain or swelling or receiving an injection.   Undergoing surgery.  HOME CARE INSTRUCTIONS   Only take over-the-counter or prescription  medicines for pain, discomfort, or fever as directed by your caregiver.   Put ice on the injured area.   Put ice in a plastic bag.   Place a towel between your skin and the bag.   Leave the ice on for 15 to 20 minutes at a time, 3 to 4 times a day.   Keep your leg raised (elevated) when possible to lessen swelling.   Avoid activities that cause ankle pain.   Follow specific exercises as directed by your caregiver.   Record how often you have ankle pain, the location of the pain, and what it feels like. This information may be helpful to you and your caregiver.   Ask your caregiver about returning to work or sports and whether you should drive.   Follow up with your caregiver for further examination, therapy, or testing as directed.  SEEK MEDICAL CARE IF:   Pain or swelling continues or worsens beyond 1 week.   You have an oral temperature above 102 F (38.9 C).   You are feeling unwell or have chills.   You are having an increasingly difficult time with walking.   You have loss of sensation or other new symptoms.   You have questions or concerns.  MAKE SURE YOU:   Understand these instructions.   Will watch your condition.   Will get help right away if you are not doing well or get worse.  Document Released: 04/07/2010 Document Revised: 10/07/2011 Document Reviewed: 04/07/2010 [facility] Patient Information 2012 [place], [place].Tendinitis Tendinitis is swelling  and inflammation of the tendons. Tendons are band-like tissues that connect muscle to bone. Tendinitis commonly occurs in the:   Shoulders (rotator cuff).   Heels (Achilles tendon).   Elbows (triceps tendon).  CAUSES Tendinitis is usually caused by overusing the tendon, muscles, and joints involved. When the tissue surrounding a tendon (synovium) becomes inflamed, it is called tenosynovitis. Tendinitis commonly develops in people whose jobs require repetitive motions. SYMPTOMS  Pain.   Tenderness.    Mild swelling.  DIAGNOSIS Tendinitis is usually diagnosed by physical exam. Your caregiver may also order X-rays or other imaging tests. TREATMENT Your caregiver may recommend certain medicines or exercises for your treatment. HOME CARE INSTRUCTIONS   Use a sling or splint for as long as directed by your caregiver until the pain decreases.   Put ice on the injured area.   Put ice in a plastic bag.   Place a towel between your skin and the bag.   Leave the ice on for 15 to 20 minutes, 3 to 4 times a day.   Avoid using the limb while the tendon is painful. Perform gentle range of motion exercises only as directed by your caregiver. Stop exercises if pain or discomfort increase, unless directed otherwise by your caregiver.   Only take over-the-counter or prescription medicines for pain, discomfort, or fever as directed by your caregiver.  SEEK MEDICAL CARE IF:   Your pain and swelling increase.   You develop new, unexplained symptoms, especially increased numbness in the hands.  MAKE SURE YOU:   Understand these instructions.   Will watch your condition.   Will get help right away if you are not doing well or get worse.  Document Released: 10/15/2000 Document Revised: 10/07/2011 Document Reviewed: 01/04/2011 [facility] Patient Information 2012 [place], [place].

## 2012-03-09 ENCOUNTER — Encounter (HOSPITAL_COMMUNITY): Payer: Self-pay

## 2012-03-09 ENCOUNTER — Emergency Department (HOSPITAL_COMMUNITY): Payer: Self-pay

## 2012-03-09 ENCOUNTER — Emergency Department (HOSPITAL_COMMUNITY)
Admission: EM | Admit: 2012-03-09 | Discharge: 2012-03-10 | Disposition: A | Discharge status: Home / Self Care | Payer: Self-pay | Attending: Emergency Medicine | Admitting: Emergency Medicine

## 2012-03-09 DIAGNOSIS — W010XXA Fall on same level from slipping, tripping and stumbling without subsequent striking against object, initial encounter: Secondary | ICD-10-CM | POA: Insufficient documentation

## 2012-03-09 DIAGNOSIS — S62109A Fracture of unspecified carpal bone, unspecified wrist, initial encounter for closed fracture: Secondary | ICD-10-CM

## 2012-03-09 DIAGNOSIS — E119 Type 2 diabetes mellitus without complications: Secondary | ICD-10-CM | POA: Insufficient documentation

## 2012-03-09 DIAGNOSIS — S52599A Other fractures of lower end of unspecified radius, initial encounter for closed fracture: Secondary | ICD-10-CM | POA: Insufficient documentation

## 2012-03-09 DIAGNOSIS — I10 Essential (primary) hypertension: Secondary | ICD-10-CM | POA: Insufficient documentation

## 2012-03-09 DIAGNOSIS — M25539 Pain in unspecified wrist: Secondary | ICD-10-CM | POA: Insufficient documentation

## 2012-03-09 DIAGNOSIS — F17200 Nicotine dependence, unspecified, uncomplicated: Secondary | ICD-10-CM | POA: Insufficient documentation

## 2012-03-09 MED ORDER — IBUPROFEN 800 MG PO TABS
800.0000 mg | ORAL_TABLET | Freq: Once | ORAL | Status: AC
  Administered 2012-03-09: 800 mg via ORAL
  Filled 2012-03-09: qty 1

## 2012-03-09 MED ORDER — HYDROCODONE-ACETAMINOPHEN 5-325 MG PO TABS
2.0000 | ORAL_TABLET | Freq: Once | ORAL | Status: AC
  Administered 2012-03-09: 2 via ORAL
  Filled 2012-03-09: qty 2

## 2012-03-09 NOTE — ED Notes (Signed)
Pt slipped and fell, landed on right wrist, pain and swelling to same

## 2012-03-09 NOTE — ED Provider Notes (Addendum)
History   This chart was scribed for [employer]. Colon Branch, MD scribed by Magnus Sinning. The patient was seen in room APA10/APA10 seen at 23:36   CSN: [id number]  Arrival date & time 03/09/12  2305   First MD Initiated Contact with Patient 03/09/12 2326      Chief Complaint  Patient presents with  . Wrist Injury    (Consider location/radiation/quality/duration/timing/severity/associated sxs/prior treatment) HPI Katrina Good is a 41 y.o. female who presents to the Emergency Department complaining of  moderate injury to her right wrist, after a fall this evening. Says she fell down backwards, and braced herself behind her onto her hands, causing injury to right wrist.    PCP: [facility] Past Medical History  Diagnosis Date  . Hypertension   . Diabetes mellitus   . Substance abuse     pt denies  . Suicidal thoughts     pt denies    Past Surgical History  Procedure Date  . Abdominal hysterectomy   . Cesarean section     x 2    Family History  Problem Relation Age of Onset  . Diabetes Other     History  Substance Use Topics  . Smoking status: Current Everyday Smoker -- 0.5 packs/day for 25 years    Types: Cigarettes  . Smokeless tobacco: Never Used  . Alcohol Use: Yes     ocsional   Review of Systems  Constitutional: Negative for fever.       10 Systems reviewed and are negative for acute change except as noted in the HPI.  HENT: Negative for congestion.   Eyes: Negative for discharge and redness.  Respiratory: Negative for cough and shortness of breath.   Cardiovascular: Negative for chest pain.  Gastrointestinal: Negative for vomiting and abdominal pain.  Musculoskeletal: Negative for back pain.       Right wrist pain and deformity  Skin: Negative for rash.  Neurological: Negative for syncope, numbness and headaches.  Psychiatric/Behavioral:       No behavior change.    Allergies  Codeine and Doxycycline  Home Medications   Current Outpatient Rx    Name Route Sig Dispense Refill  . ASPIRIN EC 81 MG PO TBEC Oral Take 81 mg by mouth daily.    Marland Kitchen GLIPIZIDE ER 2.5 MG PO TB24 Oral Take 2.5 mg by mouth daily.    Marland Kitchen HYDROCORTISONE 5 MG PO TABS Oral Take 7.5 mg by mouth daily.    . IBUPROFEN 200 MG PO TABS Oral Take 400 mg by mouth every 6 (six) hours as needed. For pain    . LISINOPRIL 5 MG PO TABS Oral Take 5 mg by mouth daily.      Marland Kitchen NAPROXEN SODIUM 220 MG PO TABS Oral Take 220 mg by mouth every 8 (eight) hours as needed. For pain    . GLIPIZIDE 10 MG PO TABS Oral Take 5 mg by mouth 2 (two) times daily before a meal.      BP 132/81  Pulse 92  Temp(Src) 98.4 F (36.9 C) (Oral)  Ht 5' 9.5" (1.765 m)  Wt 220 lb (99.791 kg)  BMI 32.02 kg/m2  SpO2 98%  Physical Exam  Nursing note and vitals reviewed. Constitutional: She is oriented to person, place, and time. She appears well-developed and well-nourished. No distress.  HENT:  Head: Normocephalic and atraumatic.  Eyes: EOM are normal. Pupils are equal, round, and reactive to light.  Neck: Neck supple. No tracheal deviation present.  Cardiovascular: Normal  rate.   Pulmonary/Chest: Effort normal. No respiratory distress.  Abdominal: Soft. She exhibits no distension.  Musculoskeletal: Normal range of motion. She exhibits no edema.       Mild deformity at medial aspect of wrist Bilateral pulses  Neurological: She is alert and oriented to person, place, and time. No sensory deficit.  Skin: Skin is warm and dry.  Psychiatric: She has a normal mood and affect. Her behavior is normal.    ED Course  Procedures (including critical care time) DIAGNOSTIC STUDIES: Oxygen Saturation is 98% on room air, normal by my interpretation.    COORDINATION OF CARE:  Dg Wrist Complete Right  03/10/2012  *RADIOLOGY REPORT*  Clinical Data: Status post fall; right wrist pain and swelling.  RIGHT WRIST - COMPLETE 3+ VIEW  Comparison: Right wrist radiographs performed 07/31/2010  Findings: There is a  mildly comminuted slightly impacted fracture involving the distal radial metaphysis, with mild dorsal and ulnar displacement of the smaller fragments.  The fracture extends to the radiocarpal joint.  Aside from the dorsally displaced fragments, no significant tilt is seen.  No additional fractures are identified.  The carpal rows appear grossly intact, and demonstrate normal alignment.  Mild soft tissue swelling is noted about the fracture site.  IMPRESSION: Mildly comminuted slightly impacted fracture involving the distal radial metaphysis, with mild dorsal and ulnar displacement of the smaller fragments.  Intra-articular extension noted.  Aside from dorsally displaced fragments, no significant angulation seen.  Original Report Authenticated By: Tonia Ghent, M.D.     MDM  Patient who fell injuring her right wrist. X-rays show a comminuted slightly impacted fracture involving the distal radial metaphysis . Patient is placed in a sugar tong splint with a sling. Referral to orthopedist Dr. Romeo Apple.Pt stable in ED with no significant deterioration in condition.The patient appears reasonably screened and/or stabilized for discharge and I doubt any other medical condition or other [facility] requiring further screening, evaluation, or treatment in the ED at this time prior to discharge.  I personally performed the services described in this documentation, which was scribed in my presence. The recorded information has been reviewed and considered.   MDM Reviewed: nursing note and vitals Interpretation: x-ray          Nicoletta Dress. Colon Branch, MD 03/10/12 0200  Nicoletta Dress. Colon Branch, MD 03/10/12 [phone]

## 2012-03-10 MED ORDER — HYDROCODONE-ACETAMINOPHEN 5-325 MG PO TABS: 1.0000 | ORAL_TABLET | ORAL | Status: AC | PRN

## 2012-03-10 NOTE — Discharge Instructions (Signed)
You have broken your wrist. Keep the arm elevated as much as possible. Apply ice for the swelling. Use the pain medicine as directed. Call Dr. Mort Sawyers office tomorrow to get an appointment for next week.   Wrist Fracture Your caregiver has diagnosed you as having a fracture of the wrist. A fracture is a break in the bone or bones. A cast or splint is used to protect and keep your injured bone(s) from moving. The cast or splint will usually be on for about 5 to 6 weeks. One of the bones of the wrist (the navicular bone) often does not show up as a fracture on X-ray until later or in the healing phase. With this bone your caregiver will often cast as though it is fractured even if not seen on the X-ray. HOME CARE INSTRUCTIONS   To lessen the swelling, keep the injured part elevated while sitting or lying down. Keeping the injury above the level of your heart (the center of the chest) will decrease swelling and pain.   Do not wear rings or jewelry on the injured hand or wrist.   Apply ice to the injury for 15 to 20 minutes, 3 to 4 times per day while awake for 2 days. Put the ice in a plastic bag and place a thin towel between the bag of ice and your cast.   If you have a plaster or fiberglass cast:   Do not try to scratch the skin under the cast using sharp or pointed objects.   Check the skin around the cast every day. You may put lotion on any red or sore areas.   Keep your cast dry and clean.   If you have a plaster splint:   Wear the splint as directed.   You may loosen the elastic around the splint if your fingers become numb, tingle, or turn cold or blue.   If you have been put in a removable splint, wear and use as directed.   Do not use powders or deodorants in or around the cast or splint.   Do not remove padding from your cast or splint.   Do not put pressure on any part of your cast or splint. It may break. Rest your cast or splint only on a pillow the first 24 hours  until it is fully hardened.   Gently move your fingers often, so they do not get stiff.   Do not remove the splint unless directed by your caregiver. Casts must be removed by an orthopedist.   Your cast or splint can be protected during bathing with a plastic bag. Do not lower the cast or splint into water.   Only take over-the-counter or prescription medicines for pain, discomfort, or fever as directed by your caregiver.   Follow up with your caregiver as directed.  SEEK IMMEDIATE MEDICAL CARE IF:   Your cast or splint gets damaged or breaks.   Your cast or splint feels too tight or loose.   You have increased pain, not controlled with medication.   You have increased swelling.   Your skin or nails below the injury turn blue or grey or feel cold or numb.   You have trouble moving or feeling your fingers.   You experience any burning or stinging from the cast or splint.   There is a bad smell coming from under the cast or splint.   New stains or fluids are coming from under the cast or splint.   You  have any new injuries while wearing the cast or splint.  Document Released: 07/28/2005 Document Revised: 10/07/2011 Document Reviewed: 05/17/2007 [facility] Patient Information 2012 [place], [place].

## 2012-03-13 ENCOUNTER — Ambulatory Visit (INDEPENDENT_AMBULATORY_CARE_PROVIDER_SITE_OTHER): Payer: Self-pay | Admitting: Orthopedic Surgery

## 2012-03-13 ENCOUNTER — Other Ambulatory Visit: Payer: Self-pay | Admitting: Orthopedic Surgery

## 2012-03-13 ENCOUNTER — Encounter: Payer: Self-pay | Admitting: Orthopedic Surgery

## 2012-03-13 ENCOUNTER — Ambulatory Visit (HOSPITAL_COMMUNITY)
Admission: RE | Admit: 2012-03-13 | Discharge: 2012-03-13 | Disposition: A | Discharge status: Home / Self Care | Payer: Self-pay | Source: Ambulatory Visit | Attending: Orthopedic Surgery | Admitting: Orthopedic Surgery

## 2012-03-13 VITALS — BP 100/76 | Ht 69.5 in | Wt 220.0 lb

## 2012-03-13 DIAGNOSIS — M25519 Pain in unspecified shoulder: Secondary | ICD-10-CM | POA: Insufficient documentation

## 2012-03-13 DIAGNOSIS — S52539A Colles' fracture of unspecified radius, initial encounter for closed fracture: Secondary | ICD-10-CM

## 2012-03-13 DIAGNOSIS — S62109A Fracture of unspecified carpal bone, unspecified wrist, initial encounter for closed fracture: Secondary | ICD-10-CM

## 2012-03-13 DIAGNOSIS — S52531A Colles' fracture of right radius, initial encounter for closed fracture: Secondary | ICD-10-CM

## 2012-03-13 DIAGNOSIS — M25511 Pain in right shoulder: Secondary | ICD-10-CM

## 2012-03-13 MED ORDER — HYDROCODONE-ACETAMINOPHEN 10-325 MG PO TABS: 1.0000 | ORAL_TABLET | ORAL | Status: AC | PRN

## 2012-03-13 MED ORDER — IBUPROFEN 800 MG PO TABS: 800.0000 mg | ORAL_TABLET | Freq: Three times a day (TID) | ORAL | Status: AC

## 2012-03-13 NOTE — Progress Notes (Signed)
  Subjective:    Katrina Good is an 41 y.o. female who presents for evaluation of a RIGHT wrist fracture, sustained on May 9. The patient fell at a friend's house when she stumbled in a hole that was in the yard. She went to the emergency room. X-rays show a comminuted fracture of the RIGHT wrist with minimal displacement and about 10 of angulation, apex volar. She complains of sharp, dull, throbbing, stabbing, burning 10 out of 10. Constant pain associated with tingling and swelling.  She has a history of fatigue and chills and shortness of breath, cough, and tightness of the chest, painful inspiration snoring, heartburn, nausea, redness and changes in the skin, nervousness depression, easy bruising excessive thirst  The following portions of the patient's history were reviewed and updated as appropriate: allergies, current medications, past family history, past medical history, past social history, past surgical history and problem list.  Review of Systems Pertinent items are noted in HPI.   Objective:    BP 100/76  Ht 5' 9.5" (1.765 m)  Wt 220 lb (99.791 kg)  BMI 32.02 kg/m2  Vital signs are stable as recorded  General appearance is normal  The patient is alert and oriented x3  The patient's mood and affect are normal  Gait assessment: normal  The cardiovascular exam reveals normal pulses and temperature without edema swelling.  The lymphatic system is negative for palpable lymph nodes  The sensory exam is normal.  There are no pathologic reflexes.  Balance is normal.   Exam of the RIGHT wrist, tenderness and swelling. No deformity is notable. She has no instability. The strength cannot be assessed. The skin is intact. She is painful. Passive and active range of motion.   Imaging: X-ray hospital x-ray shows any intra-articular type fracture, slight apex volar angulation. Slight loss of radial inclination.   Assessment:    The fracture is stable. It needs to be  followed with an x-ray. It would be most prudent to treat this nonoperatively as the patient does not have insurance. And it is a borderline case anyway.  Plan:  Application short-arm cast followup in 2 weeks. X-ray.

## 2012-03-13 NOTE — Patient Instructions (Signed)
Keep  Cast dry   Do not get wet   If it gets wet dry with a hair dryer on low setting and call the office   Keep your arm elevated apply ice as needed to control swelling   Take both pain relievers as ordered   Go to hospital to get arm x-rays to see if there are any other injuries

## 2012-03-14 NOTE — Progress Notes (Signed)
Patient aware.

## 2012-03-29 ENCOUNTER — Encounter: Payer: Self-pay | Admitting: Orthopedic Surgery

## 2012-03-29 ENCOUNTER — Ambulatory Visit (INDEPENDENT_AMBULATORY_CARE_PROVIDER_SITE_OTHER): Payer: Self-pay | Admitting: Orthopedic Surgery

## 2012-03-29 ENCOUNTER — Ambulatory Visit (INDEPENDENT_AMBULATORY_CARE_PROVIDER_SITE_OTHER): Payer: Self-pay

## 2012-03-29 VITALS — BP 122/70 | Ht 69.5 in | Wt 220.0 lb

## 2012-03-29 DIAGNOSIS — S62109A Fracture of unspecified carpal bone, unspecified wrist, initial encounter for closed fracture: Secondary | ICD-10-CM

## 2012-03-29 NOTE — Patient Instructions (Signed)
Keep  Cast dry   Do not get wet   If it gets wet dry with a hair dryer on low setting and call the office   

## 2012-03-29 NOTE — Progress Notes (Signed)
Patient ID: Katrina Good, female   DOB: 09/09/1971, 41 y.o.   MRN: [id number] Chief Complaint  Patient presents with  . Follow-up    2 week recheck and xray right arm in cast   BP 122/70  Ht 5' 9.5" (1.765 m)  Wt 220 lb (99.791 kg)  BMI 32.02 kg/m2 Katrina Good is an 41 y.o. female who presents for evaluation of a RIGHT wrist fracture, sustained on May 9. The patient fell at a friend's house when she stumbled in a hole that was in the yard. She went to the emergency room. X-rays show a comminuted fracture of the RIGHT wrist with minimal displacement and about 10 of angulation, apex volar  This is a follow-up post injury date day #20  X-ray looks good. She is complaining that the cast is cutting into her hand.  We placed a new short arm cast.

## 2012-04-20 ENCOUNTER — Emergency Department (HOSPITAL_COMMUNITY)
Admission: EM | Admit: 2012-04-20 | Discharge: 2012-04-20 | Discharge status: Against Medical Advice | Payer: Self-pay | Attending: Emergency Medicine | Admitting: Emergency Medicine

## 2012-04-20 ENCOUNTER — Encounter (HOSPITAL_COMMUNITY): Payer: Self-pay

## 2012-04-20 DIAGNOSIS — I10 Essential (primary) hypertension: Secondary | ICD-10-CM | POA: Insufficient documentation

## 2012-04-20 NOTE — ED Notes (Signed)
Just wants her bp checked today

## 2012-05-03 ENCOUNTER — Encounter: Payer: Self-pay | Admitting: Orthopedic Surgery

## 2012-05-03 ENCOUNTER — Ambulatory Visit (INDEPENDENT_AMBULATORY_CARE_PROVIDER_SITE_OTHER): Payer: Self-pay | Admitting: Orthopedic Surgery

## 2012-05-03 ENCOUNTER — Ambulatory Visit (INDEPENDENT_AMBULATORY_CARE_PROVIDER_SITE_OTHER): Payer: Self-pay

## 2012-05-03 VITALS — Ht 69.5 in | Wt 220.0 lb

## 2012-05-03 DIAGNOSIS — S62101A Fracture of unspecified carpal bone, right wrist, initial encounter for closed fracture: Secondary | ICD-10-CM

## 2012-05-03 DIAGNOSIS — S62109A Fracture of unspecified carpal bone, unspecified wrist, initial encounter for closed fracture: Secondary | ICD-10-CM

## 2012-05-03 MED ORDER — HYDROCODONE-ACETAMINOPHEN 7.5-325 MG PO TABS: 1.0000 | ORAL_TABLET | Freq: Four times a day (QID) | ORAL | Status: AC | PRN

## 2012-05-03 NOTE — Patient Instructions (Addendum)
Gradually resume activities as tolerated  It will take 4-6 weeks for the wrist to feel normal

## 2012-05-03 NOTE — Progress Notes (Signed)
Patient ID: Katrina Good, female   DOB: Oct 10, 1971, 41 y.o.   MRN: [id number] Chief Complaint  Patient presents with  . Follow-up    5 week recheck on right wrist with xray OOP.    Ht 5' 9.5" (1.765 m)  Wt 220 lb (99.791 kg)  BMI 32.02 kg/m2  RIGHT distal radius fracture treated in a cast for 8 weeks.  X-rays out of plaster show fracture healing.  Clinical exam shows stiffness of the wrist.  I instructed her on next sugar sizes to do for her wrist and recommend that she do those for 6-8 weeks in followup with [place] as needed.  Her medication was dropped in water so I gave her a new prescription x1

## 2012-07-07 ENCOUNTER — Encounter (HOSPITAL_COMMUNITY): Payer: Self-pay

## 2012-07-07 ENCOUNTER — Emergency Department (HOSPITAL_COMMUNITY)
Admission: EM | Admit: 2012-07-07 | Discharge: 2012-07-07 | Disposition: A | Discharge status: Home / Self Care | Payer: Self-pay | Attending: Emergency Medicine | Admitting: Emergency Medicine

## 2012-07-07 DIAGNOSIS — L02219 Cutaneous abscess of trunk, unspecified: Secondary | ICD-10-CM | POA: Insufficient documentation

## 2012-07-07 DIAGNOSIS — B9689 Other specified bacterial agents as the cause of diseases classified elsewhere: Secondary | ICD-10-CM | POA: Insufficient documentation

## 2012-07-07 DIAGNOSIS — L0291 Cutaneous abscess, unspecified: Secondary | ICD-10-CM

## 2012-07-07 DIAGNOSIS — A499 Bacterial infection, unspecified: Secondary | ICD-10-CM | POA: Insufficient documentation

## 2012-07-07 DIAGNOSIS — N760 Acute vaginitis: Secondary | ICD-10-CM | POA: Insufficient documentation

## 2012-07-07 DIAGNOSIS — Z9119 Patient's noncompliance with other medical treatment and regimen: Secondary | ICD-10-CM | POA: Insufficient documentation

## 2012-07-07 DIAGNOSIS — L03319 Cellulitis of trunk, unspecified: Secondary | ICD-10-CM | POA: Insufficient documentation

## 2012-07-07 DIAGNOSIS — E119 Type 2 diabetes mellitus without complications: Secondary | ICD-10-CM | POA: Insufficient documentation

## 2012-07-07 DIAGNOSIS — I10 Essential (primary) hypertension: Secondary | ICD-10-CM | POA: Insufficient documentation

## 2012-07-07 DIAGNOSIS — F17200 Nicotine dependence, unspecified, uncomplicated: Secondary | ICD-10-CM | POA: Insufficient documentation

## 2012-07-07 DIAGNOSIS — Z91199 Patient's noncompliance with other medical treatment and regimen due to unspecified reason: Secondary | ICD-10-CM | POA: Insufficient documentation

## 2012-07-07 LAB — URINALYSIS, ROUTINE W REFLEX MICROSCOPIC
Leukocytes, UA: NEGATIVE
Nitrite: NEGATIVE
Protein, ur: NEGATIVE mg/dL
Urobilinogen, UA: 1 mg/dL (ref 0.0–1.0)

## 2012-07-07 LAB — POCT PREGNANCY, URINE: Preg Test, Ur: NEGATIVE

## 2012-07-07 MED ORDER — LIDOCAINE HCL (PF) 1 % IJ SOLN
5.0000 mL | Freq: Once | INTRAMUSCULAR | Status: AC
  Administered 2012-07-07: 5 mL
  Filled 2012-07-07: qty 5

## 2012-07-07 MED ORDER — METRONIDAZOLE 500 MG PO TABS: 500.0000 mg | ORAL_TABLET | Freq: Two times a day (BID) | ORAL | Status: AC

## 2012-07-07 MED ORDER — HYDROCODONE-ACETAMINOPHEN 5-325 MG PO TABS: 1.0000 | ORAL_TABLET | ORAL | Status: AC | PRN

## 2012-07-07 MED ORDER — SULFAMETHOXAZOLE-TRIMETHOPRIM 800-160 MG PO TABS: 1.0000 | ORAL_TABLET | Freq: Two times a day (BID) | ORAL | Status: AC

## 2012-07-07 NOTE — ED Notes (Signed)
Patient with no complaints at this time. Respirations even and unlabored. Skin warm/dry. Discharge instructions reviewed with patient at this time. Patient given opportunity to voice concerns/ask questions. Patient discharged at this time and left Emergency Department with steady gait.   

## 2012-07-07 NOTE — ED Notes (Signed)
Complain of boils in vaginal area

## 2012-07-08 LAB — GC/CHLAMYDIA PROBE AMP, GENITAL
Chlamydia, DNA Probe: NEGATIVE
GC Probe Amp, Genital: NEGATIVE

## 2012-07-08 NOTE — ED Provider Notes (Signed)
History     CSN: [id number]  Arrival date & time 07/07/12  4098   First MD Initiated Contact with Patient 07/07/12 1005      Chief Complaint  Patient presents with  . Abscess    (Consider location/radiation/quality/duration/timing/severity/associated sxs/prior treatment) HPI Comments: Katrina Good presents with complaints of dyspareunia and vaginal discharge which has been present for the past several weeks.  Additionally,  She has had multiple abscesses in her groin area intermittently for the past 6 months which have drained spontaneously in the past, but today's left sided groin abscess is more tender, larger and will not drain despite using warm compresses.  She denies fevers, chills, abdominal pain, nausea and vomiting.  She does have a history of diabetes but does not check her cbg's regularly.  She has taken naproxen for her pain without relief.  She is sexually active in a monogamous relationship.  The history is provided by the patient.    Past Medical History  Diagnosis Date  . Hypertension   . Diabetes mellitus   . Substance abuse     pt denies  . Suicidal thoughts     pt denies    Past Surgical History  Procedure Date  . Abdominal hysterectomy   . Cesarean section     x 2    Family History  Problem Relation Age of Onset  . Diabetes Other   . Heart disease    . Arthritis    . Lung disease    . Cancer      History  Substance Use Topics  . Smoking status: Current Everyday Smoker -- 0.5 packs/day for 25 years    Types: Cigarettes  . Smokeless tobacco: Never Used  . Alcohol Use: No     ocsional    OB History    Grav Para Term Preterm Abortions TAB SAB Ect Mult Living   5 2 2  3 1 2   2       Review of Systems  Constitutional: Negative for fever.  HENT: Negative for congestion, sore throat and neck pain.   Eyes: Negative.   Respiratory: Negative for chest tightness and shortness of breath.   Cardiovascular: Negative for chest pain.    Gastrointestinal: Negative for nausea and abdominal pain.  Genitourinary: Positive for vaginal discharge, vaginal pain and dyspareunia. Negative for frequency, difficulty urinating and genital sores.  Musculoskeletal: Negative for joint swelling and arthralgias.  Skin: Negative for rash.       Otherwise negative.  Neurological: Negative for dizziness, weakness, light-headedness, numbness and headaches.  Hematological: Negative.   Psychiatric/Behavioral: Negative.     Allergies  Codeine and Doxycycline  Home Medications   Current Outpatient Rx  Name Route Sig Dispense Refill  . GLIPIZIDE ER 2.5 MG PO TB24 Oral Take 2.5 mg by mouth daily.    Marland Kitchen GLIPIZIDE 10 MG PO TABS Oral Take 5 mg by mouth 2 (two) times daily before a meal.    . HYDROCORTISONE 5 MG PO TABS Oral Take 7.5 mg by mouth daily.    Marland Kitchen LISINOPRIL 5 MG PO TABS Oral Take 5 mg by mouth 2 (two) times daily.    Marland Kitchen NAPROXEN SODIUM 220 MG PO TABS Oral Take 220-660 mg by mouth daily.    Marland Kitchen HYDROCODONE-ACETAMINOPHEN 5-325 MG PO TABS Oral Take 1 tablet by mouth every 4 (four) hours as needed for pain. 15 tablet 0  . METRONIDAZOLE 500 MG PO TABS Oral Take 1 tablet (500 mg total) by mouth  2 (two) times daily. 14 tablet 0  . SULFAMETHOXAZOLE-TRIMETHOPRIM 800-160 MG PO TABS Oral Take 1 tablet by mouth 2 (two) times daily. 20 tablet 0    BP 128/94  Pulse 73  Temp 98.5 F (36.9 C) (Oral)  Resp 20  Ht 5\' 9"  (1.753 m)  Wt 207 lb (93.895 kg)  BMI 30.57 kg/m2  SpO2 98%  Physical Exam  Nursing note and vitals reviewed. Constitutional: She appears well-developed and well-nourished.  HENT:  Head: Normocephalic and atraumatic.  Eyes: Conjunctivae are normal.  Neck: Normal range of motion.  Cardiovascular: Normal rate, regular rhythm, normal heart sounds and intact distal pulses.   Pulmonary/Chest: Effort normal and breath sounds normal. She has no wheezes.  Abdominal: Soft. Bowel sounds are normal. There is no tenderness.   Genitourinary: Uterus normal. Cervix exhibits no motion tenderness, no discharge and no friability. Right adnexum displays no mass, no tenderness and no fullness. Left adnexum displays no mass, no tenderness and no fullness. There is erythema and tenderness around the vagina. Vaginal discharge found.  Musculoskeletal: Normal range of motion.  Neurological: She is alert.  Skin: Skin is warm and dry.       1 cm indurated and raised tender lesion left groin.  No drainage,  Erythema extends 2 cm surrounding abscess.  Psychiatric: She has a normal mood and affect.    ED Course  Procedures (including critical care time)   INCISION AND DRAINAGE Performed by: Burgess Amor Consent: Verbal consent obtained. Risks and benefits: risks, benefits and alternatives were discussed Type: abscess  Body area: left groin  Anesthesia: local infiltration  Local anesthetic: lidocaine 1% without epinephrine  Anesthetic total: 3 ml  Complexity: complex Blunt dissection to break up loculations  Drainage: purulent  Drainage amount: scant  Packing material: no pocket amenable to packing  Patient tolerance: Patient tolerated the procedure well with no immediate complications.     Labs Reviewed  URINALYSIS, ROUTINE W REFLEX MICROSCOPIC - Abnormal; Notable for the following:    Specific Gravity, Urine >1.030 (*)     Bilirubin Urine SMALL (*)     All other components within normal limits  WET PREP, GENITAL - Abnormal; Notable for the following:    Clue Cells Wet Prep HPF POC MANY (*)     WBC, Wet Prep HPF POC RARE (*)     All other components within normal limits  RPR  POCT PREGNANCY, URINE  GLUCOSE, CAPILLARY  GC/CHLAMYDIA PROBE AMP, GENITAL   No results found.   1. Abscess   2. Bacterial vaginosis       MDM  Pt prescribed bactrim and flagyl,  Hydrocodone for pain.  Encouraged to start warm compress soaks today to wound site.  Recheck here or by pcp for any worsening sx.  Advised  partner also get treated for bv.        Burgess Amor, [place] 07/08/12 [phone]

## 2012-07-09 ENCOUNTER — Emergency Department (HOSPITAL_COMMUNITY)
Admission: EM | Admit: 2012-07-09 | Discharge: 2012-07-09 | Disposition: A | Discharge status: Home / Self Care | Payer: Self-pay | Attending: Emergency Medicine | Admitting: Emergency Medicine

## 2012-07-09 ENCOUNTER — Emergency Department (HOSPITAL_COMMUNITY): Payer: Self-pay

## 2012-07-09 ENCOUNTER — Encounter (HOSPITAL_COMMUNITY): Payer: Self-pay | Admitting: *Deleted

## 2012-07-09 DIAGNOSIS — F17200 Nicotine dependence, unspecified, uncomplicated: Secondary | ICD-10-CM | POA: Insufficient documentation

## 2012-07-09 DIAGNOSIS — I10 Essential (primary) hypertension: Secondary | ICD-10-CM | POA: Insufficient documentation

## 2012-07-09 DIAGNOSIS — R002 Palpitations: Secondary | ICD-10-CM | POA: Insufficient documentation

## 2012-07-09 DIAGNOSIS — E1169 Type 2 diabetes mellitus with other specified complication: Secondary | ICD-10-CM | POA: Insufficient documentation

## 2012-07-09 DIAGNOSIS — E876 Hypokalemia: Secondary | ICD-10-CM | POA: Insufficient documentation

## 2012-07-09 DIAGNOSIS — F411 Generalized anxiety disorder: Secondary | ICD-10-CM | POA: Insufficient documentation

## 2012-07-09 DIAGNOSIS — Z79899 Other long term (current) drug therapy: Secondary | ICD-10-CM | POA: Insufficient documentation

## 2012-07-09 DIAGNOSIS — E162 Hypoglycemia, unspecified: Secondary | ICD-10-CM

## 2012-07-09 LAB — BASIC METABOLIC PANEL
CO2: 25 mEq/L (ref 19–32)
Calcium: 9.2 mg/dL (ref 8.4–10.5)
Glucose, Bld: 79 mg/dL (ref 70–99)
Sodium: 136 mEq/L (ref 135–145)

## 2012-07-09 LAB — CBC WITH DIFFERENTIAL/PLATELET
Eosinophils Relative: 1 % (ref 0–5)
HCT: 41.3 % (ref 36.0–46.0)
Lymphocytes Relative: 36 % (ref 12–46)
Lymphs Abs: 3.5 10*3/uL (ref 0.7–4.0)
MCV: 82.1 fL (ref 78.0–100.0)
Monocytes Absolute: 0.8 10*3/uL (ref 0.1–1.0)
Platelets: 210 10*3/uL (ref 150–400)
RBC: 5.03 MIL/uL (ref 3.87–5.11)
WBC: 9.7 10*3/uL (ref 4.0–10.5)

## 2012-07-09 LAB — HEPATIC FUNCTION PANEL
AST: 14 U/L (ref 0–37)
Albumin: 3.5 g/dL (ref 3.5–5.2)
Total Bilirubin: 0.2 mg/dL — ABNORMAL LOW (ref 0.3–1.2)

## 2012-07-09 LAB — GLUCOSE, CAPILLARY
Glucose-Capillary: 43 mg/dL — CL (ref 70–99)
Glucose-Capillary: 96 mg/dL (ref 70–99)
Glucose-Capillary: 66 mg/dL — ABNORMAL LOW (ref 70–99)
Glucose-Capillary: 82 mg/dL (ref 70–99)

## 2012-07-09 MED ORDER — POTASSIUM CHLORIDE CRYS ER 20 MEQ PO TBCR
40.0000 meq | EXTENDED_RELEASE_TABLET | Freq: Once | ORAL | Status: AC
  Administered 2012-07-09: 40 meq via ORAL
  Filled 2012-07-09: qty 2

## 2012-07-09 MED ORDER — PROMETHAZINE HCL 12.5 MG PO TABS: 12.5000 mg | ORAL_TABLET | Freq: Four times a day (QID) | ORAL | Status: DC | PRN

## 2012-07-09 MED ORDER — SODIUM CHLORIDE 0.9 % IV SOLN
Freq: Once | INTRAVENOUS | Status: AC
  Administered 2012-07-09: 19:00:00 via INTRAVENOUS

## 2012-07-09 MED ORDER — PROMETHAZINE HCL 25 MG/ML IJ SOLN
12.5000 mg | Freq: Once | INTRAMUSCULAR | Status: AC
  Administered 2012-07-09: 12.5 mg via INTRAVENOUS
  Filled 2012-07-09: qty 1

## 2012-07-09 NOTE — ED Notes (Signed)
Glucose 66. Pt eating dinner at this time and drinking a soda. AAx4 NAD noted

## 2012-07-09 NOTE — ED Provider Notes (Signed)
History     CSN: [id number]  Arrival date & time 07/09/12  1818   First MD Initiated Contact with Patient 07/09/12 1847      Chief Complaint  Patient presents with  . Tachycardia  . Nausea    (Consider location/radiation/quality/duration/timing/severity/associated sxs/prior treatment) HPI Comments: Patient is a 41 year old African American female who has a history of hypertension, and diabetes mellitus. Patient presents to the emergency department because of palpitations and sensation that her heart was beating too fast. The patient states that she took her diabetic medicines this morning including glipizide and Glucotrol about 8:30 this morning. Patient states she did not eat breakfast at that moment because she was feeling somewhat nauseated. Patient states she did drink some fluids. At about 8:30 she also took her aspirin blood pressure medication and a lead. The patient then ate lunch about 12:30. Approximately 5:30 the patient was taking some antibiotics because of an abscess that she had drained on September 6. During that time she noted that she has sensation that her heart was beating very fast pounding in her chest at about 5:30 PM. This did not last long but caused her some concern and she later presented to the emergency department for evaluation the patient is seen primarily at the health department for her medical care. She had the abscess drained here in the emergency department. The patient states now she feels some better. It is of note that the patient states that she has had caffeine in the form of sodas today, she smokes cigarettes throughout the day, she had marijuana on last night, and had alcohol on last night. There has been no chest pain, sweats, vomiting, or loss of consciousness. There's been no jaw pain neck pain or shoulder pain.  The history is provided by the patient.    Past Medical History  Diagnosis Date  . Hypertension   . Diabetes mellitus   . Substance  abuse     pt denies  . Suicidal thoughts     pt denies    Past Surgical History  Procedure Date  . Abdominal hysterectomy   . Cesarean section     x 2    Family History  Problem Relation Age of Onset  . Diabetes Other   . Heart disease    . Arthritis    . Lung disease    . Cancer      History  Substance Use Topics  . Smoking status: Current Everyday Smoker -- 0.5 packs/day for 25 years    Types: Cigarettes  . Smokeless tobacco: Never Used  . Alcohol Use: No     ocsional    OB History    Grav Para Term Preterm Abortions TAB SAB Ect Mult Living   5 2 2  3 1 2   2       Review of Systems  Constitutional: Negative for activity change.       All ROS Neg except as noted in HPI  HENT: Negative for nosebleeds and neck pain.   Eyes: Negative for photophobia and discharge.  Respiratory: Negative for cough, shortness of breath and wheezing.   Cardiovascular: Positive for palpitations. Negative for chest pain.  Gastrointestinal: Positive for nausea. Negative for abdominal pain and blood in stool.  Genitourinary: Negative for dysuria, frequency and hematuria.  Musculoskeletal: Negative for back pain and arthralgias.  Skin: Negative.   Neurological: Positive for weakness. Negative for dizziness, seizures and speech difficulty.  Psychiatric/Behavioral: Negative for hallucinations and confusion.  Allergies  Codeine and Doxycycline  Home Medications   Current Outpatient Rx  Name Route Sig Dispense Refill  . GLIPIZIDE ER 2.5 MG PO TB24 Oral Take 2.5 mg by mouth daily.    Marland Kitchen GLIPIZIDE 10 MG PO TABS Oral Take 5 mg by mouth 2 (two) times daily before a meal.    . HYDROCODONE-ACETAMINOPHEN 5-325 MG PO TABS Oral Take 1 tablet by mouth every 4 (four) hours as needed for pain. 15 tablet 0  . HYDROCORTISONE 5 MG PO TABS Oral Take 7.5 mg by mouth daily.    Marland Kitchen LISINOPRIL 5 MG PO TABS Oral Take 5 mg by mouth 2 (two) times daily.    Marland Kitchen METRONIDAZOLE 500 MG PO TABS Oral Take 1 tablet  (500 mg total) by mouth 2 (two) times daily. 14 tablet 0  . NAPROXEN SODIUM 220 MG PO TABS Oral Take 220-660 mg by mouth daily.    . SULFAMETHOXAZOLE-TRIMETHOPRIM 800-160 MG PO TABS Oral Take 1 tablet by mouth 2 (two) times daily. 20 tablet 0    BP 158/94  Pulse 87  Temp 98.6 F (37 C)  Resp 19  SpO2 95%  Physical Exam  Nursing note and vitals reviewed. Constitutional: She is oriented to person, place, and time. She appears well-developed and well-nourished.  Non-toxic appearance.  HENT:  Head: Normocephalic.  Right Ear: Tympanic membrane and external ear normal.  Left Ear: Tympanic membrane and external ear normal.  Eyes: EOM and lids are normal. Pupils are equal, round, and reactive to light.  Neck: Normal range of motion. Neck supple. Carotid bruit is not present.  Cardiovascular: Normal rate, regular rhythm, normal heart sounds, intact distal pulses and normal pulses.   Pulmonary/Chest: Breath sounds normal. No respiratory distress.  Abdominal: Soft. Bowel sounds are normal. There is no tenderness. There is no guarding.  Musculoskeletal: Normal range of motion.  Lymphadenopathy:       Head (right side): No submandibular adenopathy present.       Head (left side): No submandibular adenopathy present.    She has no cervical adenopathy.  Neurological: She is alert and oriented to person, place, and time. She has normal strength. No cranial nerve deficit or sensory deficit.  Skin: Skin is warm and dry.  Psychiatric: Her speech is normal. Her mood appears anxious.    ED Course  Procedures (including critical care time)  Labs Reviewed  CBC WITH DIFFERENTIAL - Abnormal; Notable for the following:    RDW 15.6 (*)     All other components within normal limits  BASIC METABOLIC PANEL - Abnormal; Notable for the following:    Potassium 3.1 (*)     All other components within normal limits  HEPATIC FUNCTION PANEL - Abnormal; Notable for the following:    Total Bilirubin 0.2 (*)       All other components within normal limits  GLUCOSE, CAPILLARY - Abnormal; Notable for the following:    Glucose-Capillary 43 (*)     All other components within normal limits  GLUCOSE, CAPILLARY  TROPONIN I  GLUCOSE, CAPILLARY   Dg Chest Portable 1 View  07/09/2012  *RADIOLOGY REPORT*  Clinical Data: Tachycardia.  Nausea.  PORTABLE CHEST - 1 VIEW  Comparison: 11/22/2011.  Findings: The cardiac silhouette, mediastinal and hilar contours are within normal limits and stable.  The lungs are clear.  No pleural effusion.  The bony thorax is intact.  IMPRESSION: No acute cardiopulmonary findings.   Original Report Authenticated By: P. Loralie Champagne, M.D.  EKG 1828 : Rate-86. Rhythm-sinus rhythm with first degree AV block. PR-slightly prolonged. Axis-normal ST-T-normal.No STEMI. No life-threatening arrhythmias.  EKG 2054: Rate-78. Rhythm normal sinus. PR-normal. Axis-normal. ST-T-normal.No STEMI. No life-threatening arrhythmias.  No diagnosis found.    MDM  I have reviewed nursing notes, vital signs, and all appropriate lab and imaging results for this patient. Glucose upon arrival was low at 43. Glucose is now up to 96. Hepatic function was within normal limits with exception of the total bilirubin being 0.2. The potassium was slightly low at 3.1 otherwise the basic metabolic panel was well within normal limits. The troponin was less than 0.30 (within normal limits). The complete blood count was within normal limits. The chest x-ray showed no acute cardiopulmonary findings. Electrocardiograms showed normal sinus rhythm with first degree AV block x2. No acute findings on EKG. Pt was given a meal, but cbg was only 66. Will observe for now and recheck the patient's cbg.  10:45 - Pt is awake and alert. Ambulatory to the bathroom without problem. Glucose up to 82. Pt took glucotrol 10 mg at 8:30am (peaks in 2 to 3 hours per pharmacy). She took Glucotrol xL at 8:30am (peaks at 6 hours) . Pt will take  a bedtime snack upon arrival home. Safe for pt to be discharged at this time.   The plan at this time is to educate the patient on the importance of eating meals on time he specially when taking a long-acting diabetic medications. The patient was given a meal here in the department before leaving. The patient was given oral potassium for the slightly low potassium. The patient is instructed on foods that are high in potassium to keep this problem from reoccurring. Patient is invited to return to the emergency department if any changes, problems, or concerns.       Kathie Dike, [place] 07/09/12 2248

## 2012-07-09 NOTE — ED Notes (Signed)
Noticed altered mental status in pt. Checked CBG and resulted in 43. Gave 8oz grape juice orally. Pt tolerated the juice well. Pt is now AAx4 and states she feels better. Will recheck CBG. Dr Richrd Prime made aware

## 2012-07-09 NOTE — ED Notes (Signed)
Pt reports that she has been having problems with her blood sugar "dropping" at home today, was taking her medication this evening, lisinoril 5mg , bactrim ds, flagyl 500mg  and afterwards felt like her heart was beating fast, n/v, sob,

## 2012-07-09 NOTE — ED Notes (Signed)
Pt stated she wanted her CBG checked again, Resulted at 82. Pt given another juice and some crackers. Pt AAx4 NAD noted

## 2012-07-09 NOTE — ED Notes (Signed)
Pt states no chest pain at this time, denies neasea at this time but states it comes and goes. No Acute distress noted at this time.

## 2012-07-11 ENCOUNTER — Encounter (HOSPITAL_COMMUNITY): Payer: Self-pay | Admitting: Emergency Medicine

## 2012-07-11 ENCOUNTER — Other Ambulatory Visit: Payer: Self-pay

## 2012-07-11 ENCOUNTER — Emergency Department (HOSPITAL_COMMUNITY)
Admission: EM | Admit: 2012-07-11 | Discharge: 2012-07-11 | Disposition: A | Discharge status: Home / Self Care | Payer: Self-pay | Attending: Emergency Medicine | Admitting: Emergency Medicine

## 2012-07-11 DIAGNOSIS — IMO0001 Reserved for inherently not codable concepts without codable children: Secondary | ICD-10-CM

## 2012-07-11 DIAGNOSIS — Z8249 Family history of ischemic heart disease and other diseases of the circulatory system: Secondary | ICD-10-CM | POA: Insufficient documentation

## 2012-07-11 DIAGNOSIS — I10 Essential (primary) hypertension: Secondary | ICD-10-CM | POA: Insufficient documentation

## 2012-07-11 DIAGNOSIS — Z881 Allergy status to other antibiotic agents status: Secondary | ICD-10-CM | POA: Insufficient documentation

## 2012-07-11 DIAGNOSIS — Z885 Allergy status to narcotic agent status: Secondary | ICD-10-CM | POA: Insufficient documentation

## 2012-07-11 DIAGNOSIS — Z8489 Family history of other specified conditions: Secondary | ICD-10-CM | POA: Insufficient documentation

## 2012-07-11 DIAGNOSIS — Z8261 Family history of arthritis: Secondary | ICD-10-CM | POA: Insufficient documentation

## 2012-07-11 DIAGNOSIS — E119 Type 2 diabetes mellitus without complications: Secondary | ICD-10-CM | POA: Insufficient documentation

## 2012-07-11 DIAGNOSIS — K219 Gastro-esophageal reflux disease without esophagitis: Secondary | ICD-10-CM | POA: Insufficient documentation

## 2012-07-11 DIAGNOSIS — Z833 Family history of diabetes mellitus: Secondary | ICD-10-CM | POA: Insufficient documentation

## 2012-07-11 DIAGNOSIS — F17200 Nicotine dependence, unspecified, uncomplicated: Secondary | ICD-10-CM | POA: Insufficient documentation

## 2012-07-11 DIAGNOSIS — Z809 Family history of malignant neoplasm, unspecified: Secondary | ICD-10-CM | POA: Insufficient documentation

## 2012-07-11 LAB — GLUCOSE, CAPILLARY: Glucose-Capillary: 114 mg/dL — ABNORMAL HIGH (ref 70–99)

## 2012-07-11 MED ORDER — FAMOTIDINE 20 MG PO TABS
20.0000 mg | ORAL_TABLET | Freq: Once | ORAL | Status: AC
  Administered 2012-07-11: 20 mg via ORAL
  Filled 2012-07-11: qty 1

## 2012-07-11 NOTE — ED Provider Notes (Signed)
History     CSN: [id number]  Arrival date & time 07/11/12  0310   First MD Initiated Contact with Patient 07/11/12 0320      Chief Complaint  Patient presents with  . Heartburn    (Consider location/radiation/quality/duration/timing/severity/associated sxs/prior treatment) HPI Katrina Good is a 41 y.o. female with a h/o DM, HTN, polysubstance abuse  who presents to the Emergency Department complaining of reflux since yesterday worse tonight when she went to bed. Awakened with sour taste in her mouth and central chest discomfort. Denies fever, chills, nausea, vomiting. Diagnosed with BV and on both Septra and Flagyl. She has taken no medicines for the reflux.  PCP Dr. Ledell Peoples  Past Medical History  Diagnosis Date  . Hypertension   . Diabetes mellitus   . Substance abuse     pt denies  . Suicidal thoughts     pt denies    Past Surgical History  Procedure Date  . Abdominal hysterectomy   . Cesarean section     x 2    Family History  Problem Relation Age of Onset  . Diabetes Other   . Heart disease    . Arthritis    . Lung disease    . Cancer      History  Substance Use Topics  . Smoking status: Current Everyday Smoker -- 0.5 packs/day for 25 years    Types: Cigarettes  . Smokeless tobacco: Never Used  . Alcohol Use: No     ocsional    OB History    Grav Para Term Preterm Abortions TAB SAB Ect Mult Living   5 2 2  3 1 2   2       Review of Systems  Constitutional: Negative for fever.       10 Systems reviewed and are negative for acute change except as noted in the HPI.  HENT: Negative for congestion.   Eyes: Negative for discharge and redness.  Respiratory: Negative for cough and shortness of breath.   Cardiovascular: Negative for chest pain.  Gastrointestinal: Negative for vomiting and abdominal pain.       Reflux  Musculoskeletal: Negative for back pain.  Skin: Negative for rash.  Neurological: Negative for syncope, numbness and headaches.    Psychiatric/Behavioral:       No behavior change.    Allergies  Codeine and Doxycycline  Home Medications   Current Outpatient Rx  Name Route Sig Dispense Refill  . GLIPIZIDE ER 2.5 MG PO TB24 Oral Take 2.5 mg by mouth daily.    Marland Kitchen GLIPIZIDE 10 MG PO TABS Oral Take 5 mg by mouth 2 (two) times daily before a meal.    . HYDROCODONE-ACETAMINOPHEN 5-325 MG PO TABS Oral Take 1 tablet by mouth every 4 (four) hours as needed for pain. 15 tablet 0  . LISINOPRIL 5 MG PO TABS Oral Take 5 mg by mouth 2 (two) times daily.    Marland Kitchen METRONIDAZOLE 500 MG PO TABS Oral Take 1 tablet (500 mg total) by mouth 2 (two) times daily. 14 tablet 0  . NAPROXEN SODIUM 220 MG PO TABS Oral Take 220-660 mg by mouth daily.    Marland Kitchen PROMETHAZINE HCL 12.5 MG PO TABS Oral Take 1 tablet (12.5 mg total) by mouth every 6 (six) hours as needed for nausea. 10 tablet 0  . SULFAMETHOXAZOLE-TRIMETHOPRIM 800-160 MG PO TABS Oral Take 1 tablet by mouth 2 (two) times daily. 20 tablet 0  . HYDROCORTISONE 5 MG PO TABS Oral Take 7.5  mg by mouth daily.    Marland Kitchen PROMETHAZINE HCL 25 MG PO TABS Oral Take 1 tablet (25 mg total) by mouth every 6 (six) hours as needed for nausea. 10 tablet 0    BP 153/112  Pulse 85  Temp 98.4 F (36.9 C) (Oral)  Resp 18  Ht 5\' 9"  (1.753 m)  Wt 207 lb (93.895 kg)  BMI 30.57 kg/m2  SpO2 100%  Physical Exam  Nursing note and vitals reviewed. Constitutional: She appears well-developed and well-nourished. No distress.       Awake, alert, nontoxic appearance.  HENT:  Head: Atraumatic.  Eyes: Right eye exhibits no discharge. Left eye exhibits no discharge.  Neck: Neck supple.  Cardiovascular: Normal heart sounds.   Pulmonary/Chest: Effort normal and breath sounds normal. She exhibits no tenderness.  Abdominal: Soft. Bowel sounds are normal. There is no tenderness. There is no rebound and no guarding.  Musculoskeletal: She exhibits no tenderness.       Baseline ROM, no obvious new focal weakness.  Neurological:        Mental status and motor strength appears baseline for patient and situation.  Skin: No rash noted.  Psychiatric: She has a normal mood and affect.    ED Course  Procedures (including critical care time)  Labs Reviewed - No data to display  Date: 07/11/2012  0324  Rate: 79  Rhythm: normal sinus rhythm  QRS Axis: normal  Intervals: normal  ST/T Wave abnormalities: normal  Conduction Disutrbances: none  Narrative Interpretation: unremarkable      No diagnosis found.    MDM  Patient with acid reflux on antibiotics for BV. Given pepcid with relief of symptoms. Pt stable in ED with no significant deterioration in condition.The patient appears reasonably screened and/or stabilized for discharge and I doubt any other medical condition or other [facility] requiring further screening, evaluation, or treatment in the ED at this time prior to discharge.  MDM Reviewed: nursing note and vitals Interpretation: ECG           Nicoletta Dress. Colon Branch, MD 07/11/12 [phone]

## 2012-07-11 NOTE — ED Notes (Signed)
Patient c/o heartburn since yesterday.  Denies chest pain.

## 2012-07-11 NOTE — ED Notes (Addendum)
Skin warm and dry  Color good.  12 lead EKG shows NSR

## 2012-07-11 NOTE — ED Provider Notes (Signed)
Medical screening examination/treatment/procedure(s) were performed by non-physician practitioner and as supervising physician I was immediately available for consultation/collaboration.   Adriauna Campton W Yazmeen Woolf, MD 07/11/12 1657 

## 2012-07-13 NOTE — ED Provider Notes (Signed)
Medical screening examination/treatment/procedure(s) were performed by non-physician practitioner and as supervising physician I was immediately available for consultation/collaboration.  Calinda Stockinger, MD 07/13/12 0322 

## 2012-08-17 ENCOUNTER — Emergency Department (HOSPITAL_COMMUNITY)
Admission: EM | Admit: 2012-08-17 | Discharge: 2012-08-17 | Disposition: A | Discharge status: Home / Self Care | Payer: Self-pay | Attending: Emergency Medicine | Admitting: Emergency Medicine

## 2012-08-17 ENCOUNTER — Emergency Department (HOSPITAL_COMMUNITY): Payer: Self-pay

## 2012-08-17 ENCOUNTER — Encounter (HOSPITAL_COMMUNITY): Payer: Self-pay

## 2012-08-17 DIAGNOSIS — F17200 Nicotine dependence, unspecified, uncomplicated: Secondary | ICD-10-CM | POA: Insufficient documentation

## 2012-08-17 DIAGNOSIS — I10 Essential (primary) hypertension: Secondary | ICD-10-CM | POA: Insufficient documentation

## 2012-08-17 DIAGNOSIS — E119 Type 2 diabetes mellitus without complications: Secondary | ICD-10-CM | POA: Insufficient documentation

## 2012-08-17 DIAGNOSIS — J40 Bronchitis, not specified as acute or chronic: Secondary | ICD-10-CM | POA: Insufficient documentation

## 2012-08-17 LAB — GLUCOSE, CAPILLARY: Glucose-Capillary: 107 mg/dL — ABNORMAL HIGH (ref 70–99)

## 2012-08-17 MED ORDER — ALBUTEROL SULFATE HFA 108 (90 BASE) MCG/ACT IN AERS
2.0000 | INHALATION_SPRAY | Freq: Once | RESPIRATORY_TRACT | Status: AC
  Administered 2012-08-17: 2 via RESPIRATORY_TRACT
  Filled 2012-08-17: qty 6.7

## 2012-08-17 MED ORDER — AZITHROMYCIN 250 MG PO TABS: ORAL_TABLET | ORAL | Status: DC

## 2012-08-17 MED ORDER — HYDROCOD POLST-CHLORPHEN POLST 10-8 MG/5ML PO LQCR
5.0000 mL | Freq: Once | ORAL | Status: AC
  Administered 2012-08-17: 5 mL via ORAL
  Filled 2012-08-17: qty 5

## 2012-08-17 MED ORDER — IBUPROFEN 800 MG PO TABS
800.0000 mg | ORAL_TABLET | Freq: Once | ORAL | Status: AC
  Administered 2012-08-17: 800 mg via ORAL
  Filled 2012-08-17: qty 1

## 2012-08-17 MED ORDER — HYDROCOD POLST-CHLORPHEN POLST 10-8 MG/5ML PO LQCR: 5.0000 mL | Freq: Two times a day (BID) | ORAL | Status: DC

## 2012-08-17 NOTE — ED Notes (Signed)
Cough/congestion and body aches x 3 days. resp even/nonlabored.speaks complete sentences without difficulty. Ambulated to triage without difficulty.

## 2012-08-17 NOTE — ED Notes (Signed)
Pt has already been seen by PA,  Alert, frequent coughing, pt says productive of yellow sputum.    Sick for 3 days

## 2012-08-17 NOTE — ED Notes (Signed)
Resp therapy  Here to instruct about inhaler.

## 2012-08-19 NOTE — ED Provider Notes (Signed)
History     CSN: [id number]  Arrival date & time 08/17/12  1102   First MD Initiated Contact with Patient 08/17/12 1129      Chief Complaint  Patient presents with  . Nasal Congestion  . Cough  . Generalized Body Aches    (Consider location/radiation/quality/duration/timing/severity/associated sxs/prior treatment) Patient is a 41 y.o. female presenting with cough. The history is provided by the patient.  Cough This is a new problem. The current episode started more than 2 days ago. The problem occurs constantly. The problem has been gradually worsening. The cough is productive of sputum. The maximum temperature recorded prior to her arrival was 100 to 100.9 F. The fever has been present for 1 to 2 days. Associated symptoms include chills, sweats, rhinorrhea, sore throat and myalgias. Pertinent negatives include no chest pain, no ear congestion, no ear pain, no headaches, no shortness of breath and no wheezing. She has tried decongestants for the symptoms. The treatment provided no relief. She is a smoker. Her past medical history is significant for bronchitis. Her past medical history does not include pneumonia or asthma.    Past Medical History  Diagnosis Date  . Hypertension   . Diabetes mellitus   . Substance abuse     pt denies  . Suicidal thoughts     pt denies    Past Surgical History  Procedure Date  . Abdominal hysterectomy   . Cesarean section     x 2    Family History  Problem Relation Age of Onset  . Diabetes Other   . Heart disease    . Arthritis    . Lung disease    . Cancer      History  Substance Use Topics  . Smoking status: Current Every Day Smoker -- 0.5 packs/day for 25 years    Types: Cigarettes  . Smokeless tobacco: Never Used  . Alcohol Use: No     ocsional    OB History    Grav Para Term Preterm Abortions TAB SAB Ect Mult Living   5 2 2  3 1 2   2       Review of Systems  Constitutional: Positive for fever and chills. Negative for  activity change and appetite change.  HENT: Positive for congestion, sore throat and rhinorrhea. Negative for ear pain, facial swelling, trouble swallowing, neck pain and neck stiffness.   Eyes: Negative for visual disturbance.  Respiratory: Positive for cough and chest tightness. Negative for shortness of breath, wheezing and stridor.   Cardiovascular: Negative for chest pain.  Gastrointestinal: Negative for nausea and vomiting.  Musculoskeletal: Positive for myalgias.  Skin: Negative.   Neurological: Negative for dizziness, weakness, numbness and headaches.  Hematological: Negative for adenopathy.  Psychiatric/Behavioral: Negative for confusion.  All other systems reviewed and are negative.    Allergies  Codeine and Doxycycline  Home Medications   Current Outpatient Rx  Name Route Sig Dispense Refill  . FAMOTIDINE 10 MG PO TABS Oral Take 10 mg by mouth daily.    Marland Kitchen GLIPIZIDE ER 2.5 MG PO TB24 Oral Take 2.5 mg by mouth daily.    Marland Kitchen GLIPIZIDE 10 MG PO TABS Oral Take 5 mg by mouth 2 (two) times daily before a meal.    . HYDROCODONE-ACETAMINOPHEN 5-500 MG PO TABS Oral Take 0.5 tablets by mouth every 6 (six) hours as needed. Pain.    Marland Kitchen LISINOPRIL 5 MG PO TABS Oral Take 5 mg by mouth 2 (two) times daily.    Marland Kitchen  METRONIDAZOLE 500 MG PO TABS Oral Take 500 mg by mouth 3 (three) times daily.    Marland Kitchen NAPROXEN SODIUM 220 MG PO TABS Oral Take 220-660 mg by mouth daily.    Marland Kitchen PROMETHAZINE HCL 12.5 MG PO TABS Oral Take 12.5 mg by mouth every 6 (six) hours as needed. Nausea.    . SULFAMETHOXAZOLE-TMP DS 800-160 MG PO TABS Oral Take 1 tablet by mouth 2 (two) times daily.    . AZITHROMYCIN 250 MG PO TABS  Take two tablets on day one, then one tab qd days 2-5 6 tablet 0  . HYDROCOD POLST-CPM POLST ER 10-8 MG/5ML PO LQCR Oral Take 5 mLs by mouth every 12 (twelve) hours. 140 mL 0    BP 121/76  Pulse 95  Temp 98.3 F (36.8 C)  Resp 18  Ht 5\' 9"  (1.753 m)  Wt 205 lb (92.987 kg)  BMI 30.27 kg/m2  SpO2  100%  Physical Exam  Nursing note and vitals reviewed. Constitutional: She is oriented to person, place, and time. She appears well-developed and well-nourished. No distress.  HENT:  Head: Normocephalic and atraumatic.  Right Ear: Tympanic membrane and ear canal normal.  Left Ear: Tympanic membrane and ear canal normal.  Nose: Mucosal edema and rhinorrhea present. Right sinus exhibits no maxillary sinus tenderness and no frontal sinus tenderness. Left sinus exhibits no maxillary sinus tenderness and no frontal sinus tenderness.  Mouth/Throat: Uvula is midline, oropharynx is clear and moist and mucous membranes are normal. No oropharyngeal exudate.  Eyes: EOM are normal. Pupils are equal, round, and reactive to light.  Neck: Normal range of motion. Neck supple.  Cardiovascular: Normal rate, regular rhythm, normal heart sounds and intact distal pulses.   No murmur heard. Pulmonary/Chest: Effort normal. No respiratory distress. She has no rales. She exhibits no tenderness.       Coarse lungs sounds bilaterally.  No rales   Musculoskeletal: She exhibits no edema.  Lymphadenopathy:    She has no cervical adenopathy.  Neurological: She is alert and oriented to person, place, and time. She exhibits normal muscle tone. Coordination normal.  Skin: Skin is warm and dry.    ED Course  Procedures (including critical care time)  Labs Reviewed  GLUCOSE, CAPILLARY - Abnormal; Notable for the following:    Glucose-Capillary 107 (*)     All other components within normal limits  LAB REPORT - SCANNED   No results found.   Dg Chest 2 View  08/17/2012  *RADIOLOGY REPORT*  Clinical Data: Cough and congestion  CHEST - 2 VIEW  Comparison: 07/09/2012  Findings: The heart and pulmonary vascularity are within normal limits.  The lungs are free of acute infiltrate bilaterally.  No sizable effusion is seen.  No acute bony abnormality is noted.  IMPRESSION: No acute abnormality seen.   Original Report  Authenticated By: Phillips Odor, M.D.     1. Bronchitis       MDM   Patient is feeling better, ambulates with a steady gait.  Non-toxic appearing.  Likely bronchitis, clinical suspicion for PE is low.     Pt agrees to rest, fluids, motrin for fever.  Close f/u with her PMD or to return here if her sx's worsen  Prescribed: zithromax tussionex  Dispensed albuterol MDI  Pasqualino Witherspoon L. [place], [place] 08/19/12 2057

## 2012-08-21 NOTE — ED Provider Notes (Signed)
Medical screening examination/treatment/procedure(s) were performed by non-physician practitioner and as supervising physician I was immediately available for consultation/collaboration.  Donnetta Hutching, MD 08/21/12 [phone]

## 2012-09-15 ENCOUNTER — Emergency Department (HOSPITAL_COMMUNITY)
Admission: EM | Admit: 2012-09-15 | Discharge: 2012-09-15 | Disposition: A | Discharge status: Home / Self Care | Payer: Self-pay | Attending: Emergency Medicine | Admitting: Emergency Medicine

## 2012-09-15 ENCOUNTER — Encounter (HOSPITAL_COMMUNITY): Payer: Self-pay | Admitting: Emergency Medicine

## 2012-09-15 DIAGNOSIS — F17200 Nicotine dependence, unspecified, uncomplicated: Secondary | ICD-10-CM | POA: Insufficient documentation

## 2012-09-15 DIAGNOSIS — R21 Rash and other nonspecific skin eruption: Secondary | ICD-10-CM

## 2012-09-15 DIAGNOSIS — Z79899 Other long term (current) drug therapy: Secondary | ICD-10-CM | POA: Insufficient documentation

## 2012-09-15 DIAGNOSIS — E119 Type 2 diabetes mellitus without complications: Secondary | ICD-10-CM | POA: Insufficient documentation

## 2012-09-15 DIAGNOSIS — I10 Essential (primary) hypertension: Secondary | ICD-10-CM | POA: Insufficient documentation

## 2012-09-15 MED ORDER — PREDNISONE 50 MG PO TABS: ORAL_TABLET | ORAL | Status: DC

## 2012-09-15 MED ORDER — HYDROXYZINE HCL 25 MG PO TABS: 25.0000 mg | ORAL_TABLET | Freq: Three times a day (TID) | ORAL | Status: DC | PRN

## 2012-09-15 MED ORDER — HYDROXYZINE HCL 25 MG PO TABS
25.0000 mg | ORAL_TABLET | Freq: Once | ORAL | Status: AC
  Administered 2012-09-15: 25 mg via ORAL
  Filled 2012-09-15: qty 1

## 2012-09-15 MED ORDER — PREDNISONE 50 MG PO TABS
60.0000 mg | ORAL_TABLET | Freq: Once | ORAL | Status: AC
  Administered 2012-09-15: 60 mg via ORAL
  Filled 2012-09-15: qty 1

## 2012-09-15 NOTE — ED Notes (Signed)
Patient complaining of generalized rash and itching x 2 days. States she is unable to sleep due to itching. Raised red bumps noted to arms and back.

## 2012-09-15 NOTE — ED Provider Notes (Signed)
History     CSN: [id number]  Arrival date & time 09/15/12  4098   First MD Initiated Contact with Patient 09/15/12 [phone]      Chief Complaint  Patient presents with  . Rash     Patient is a 41 y.o. female presenting with rash. The history is provided by the patient.  Rash  This is a new problem. The current episode started 2 days ago. The problem has been gradually worsening. The problem is associated with nothing. There has been no fever. Affected Location: arms, back. The patient is experiencing no pain. The pain has been constant since onset. Associated symptoms include itching. Treatments tried: rubbing alcohol. The treatment provided no relief.  PT reports that she has had rash for two days  She reports she can not sleep due to itching No difficulty breathing and no facial/oral swelling reported She does not recall any new meds or exposures to cause the rash  Past Medical History  Diagnosis Date  . Hypertension   . Diabetes mellitus   . Substance abuse     pt denies  . Suicidal thoughts     pt denies    Past Surgical History  Procedure Date  . Abdominal hysterectomy   . Cesarean section     x 2    Family History  Problem Relation Age of Onset  . Diabetes Other   . Heart disease    . Arthritis    . Lung disease    . Cancer      History  Substance Use Topics  . Smoking status: Current Every Day Smoker -- 0.5 packs/day for 25 years    Types: Cigarettes  . Smokeless tobacco: Never Used  . Alcohol Use: No     Comment: ocsional    OB History    Grav Para Term Preterm Abortions TAB SAB Ect Mult Living   5 2 2  3 1 2   2       Review of Systems  Constitutional: Negative for fever.  Skin: Positive for itching and rash.    Allergies  Codeine and Doxycycline  Home Medications   Current Outpatient Rx  Name  Route  Sig  Dispense  Refill  . GLIPIZIDE ER 2.5 MG PO TB24   Oral   Take 2.5 mg by mouth daily.         Marland Kitchen GLIPIZIDE 10 MG PO TABS    Oral   Take 5 mg by mouth 2 (two) times daily before a meal.         . HYDROXYZINE HCL 25 MG PO TABS   Oral   Take 1 tablet (25 mg total) by mouth every 8 (eight) hours as needed for itching.   12 tablet   0   . LISINOPRIL 5 MG PO TABS   Oral   Take 5 mg by mouth 2 (two) times daily.         Marland Kitchen PREDNISONE 50 MG PO TABS      One tablet PO daily for 4 days   4 tablet   0     BP 128/79  Pulse 104  Temp 98.5 F (36.9 C) (Oral)  Resp 16  Ht 5\' 9"  (1.753 m)  Wt 204 lb 6 oz (92.704 kg)  BMI 30.18 kg/m2  SpO2 100%  Physical Exam CONSTITUTIONAL: Well developed/well nourished HEAD AND FACE: Normocephalic/atraumatic EYES: EOMI ENMT: Mucous membranes moist, no angioedema noted NECK: supple no meningeal signs LUNGS:  no apparent distress  ABDOMEN: soft, nontender, no rebound or guarding NEURO: Pt is awake/alert, moves all extremitiesx4 EXTREMITIES: pulses normal, full ROM SKIN: warm, color normal. Papular rash noted bilateral upper extremities and back.  Erythema noted.  No drainage noted PSYCH: no abnormalities of mood noted  ED Course  Procedures  1. Rash    Will treat with vistaril and prednisone.  Pt reports she can not tolerate benadryl.   MDM  Nursing notes including past medical history and social history reviewed and considered in documentation         Joya Gaskins, MD 09/15/12 [phone]

## 2012-10-05 ENCOUNTER — Emergency Department (HOSPITAL_COMMUNITY)
Admission: EM | Admit: 2012-10-05 | Discharge: 2012-10-05 | Discharge status: Against Medical Advice | Payer: Self-pay | Attending: Emergency Medicine | Admitting: Emergency Medicine

## 2012-10-05 ENCOUNTER — Encounter (HOSPITAL_COMMUNITY): Payer: Self-pay | Admitting: Emergency Medicine

## 2012-10-05 DIAGNOSIS — R109 Unspecified abdominal pain: Secondary | ICD-10-CM | POA: Insufficient documentation

## 2012-10-05 NOTE — ED Notes (Signed)
Called to Room. Patient stating she is feeling better and does not want to be seen. Patient alert/oriented x 4. RN spoke with patient about benefits of being seen my EDP and risks for leaving without examination/treatment. Patient gave verbal understanding of risks/benefits. Patient informed that she can always return to ED for new/worsening symptoms. Patient gave verbal understanding of this as well. Patient left department at this time. Respirations even and unlabored at time of departure.

## 2012-10-05 NOTE — ED Notes (Addendum)
Pt states had sexual intercourse x 2 days ago and has been hurting to lower suprapubic area since. N/v x 2 weeks everyday. Nad. Mm  Moist. Denies urinary sx's. Last bm x 1 day ago. Denies vag d/c

## 2013-01-28 ENCOUNTER — Encounter (HOSPITAL_COMMUNITY): Payer: Self-pay | Admitting: Emergency Medicine

## 2013-01-28 ENCOUNTER — Emergency Department (HOSPITAL_COMMUNITY): Payer: Self-pay

## 2013-01-28 ENCOUNTER — Emergency Department (HOSPITAL_COMMUNITY)
Admission: EM | Admit: 2013-01-28 | Discharge: 2013-01-28 | Disposition: A | Discharge status: Home / Self Care | Payer: Self-pay | Attending: Emergency Medicine | Admitting: Emergency Medicine

## 2013-01-28 DIAGNOSIS — R45851 Suicidal ideations: Secondary | ICD-10-CM | POA: Insufficient documentation

## 2013-01-28 DIAGNOSIS — I10 Essential (primary) hypertension: Secondary | ICD-10-CM | POA: Insufficient documentation

## 2013-01-28 DIAGNOSIS — F17200 Nicotine dependence, unspecified, uncomplicated: Secondary | ICD-10-CM | POA: Insufficient documentation

## 2013-01-28 DIAGNOSIS — Z79899 Other long term (current) drug therapy: Secondary | ICD-10-CM | POA: Insufficient documentation

## 2013-01-28 DIAGNOSIS — R1110 Vomiting, unspecified: Secondary | ICD-10-CM

## 2013-01-28 DIAGNOSIS — R197 Diarrhea, unspecified: Secondary | ICD-10-CM | POA: Insufficient documentation

## 2013-01-28 DIAGNOSIS — E119 Type 2 diabetes mellitus without complications: Secondary | ICD-10-CM | POA: Insufficient documentation

## 2013-01-28 DIAGNOSIS — R112 Nausea with vomiting, unspecified: Secondary | ICD-10-CM | POA: Insufficient documentation

## 2013-01-28 LAB — CBC WITH DIFFERENTIAL/PLATELET
Basophils Relative: 0 % (ref 0–1)
HCT: 45.8 % (ref 36.0–46.0)
Hemoglobin: 15.4 g/dL — ABNORMAL HIGH (ref 12.0–15.0)
Lymphocytes Relative: 26 % (ref 12–46)
Lymphs Abs: 2.5 10*3/uL (ref 0.7–4.0)
MCHC: 33.6 g/dL (ref 30.0–36.0)
Monocytes Absolute: 0.7 10*3/uL (ref 0.1–1.0)
Monocytes Relative: 8 % (ref 3–12)
Neutro Abs: 6.1 10*3/uL (ref 1.7–7.7)

## 2013-01-28 LAB — LIPASE, BLOOD: Lipase: 40 U/L (ref 11–59)

## 2013-01-28 LAB — COMPREHENSIVE METABOLIC PANEL
BUN: 9 mg/dL (ref 6–23)
CO2: 26 mEq/L (ref 19–32)
Chloride: 104 mEq/L (ref 96–112)
Creatinine, Ser: 0.88 mg/dL (ref 0.50–1.10)
GFR calc Af Amer: >90 mL/min (ref >90)
GFR calc non Af Amer: 81 mL/min — ABNORMAL LOW (ref >90)
Glucose, Bld: 100 mg/dL — ABNORMAL HIGH (ref 70–99)
Total Bilirubin: 0.2 mg/dL — ABNORMAL LOW (ref 0.3–1.2)

## 2013-01-28 LAB — URINALYSIS, ROUTINE W REFLEX MICROSCOPIC
Bilirubin Urine: NEGATIVE
Ketones, ur: NEGATIVE mg/dL
Leukocytes, UA: NEGATIVE
Nitrite: NEGATIVE
Protein, ur: NEGATIVE mg/dL

## 2013-01-28 MED ORDER — TRAMADOL HCL 50 MG PO TABS: 50.0000 mg | ORAL_TABLET | Freq: Four times a day (QID) | ORAL | Status: DC | PRN

## 2013-01-28 MED ORDER — OXYCODONE-ACETAMINOPHEN 5-325 MG PO TABS
1.0000 | ORAL_TABLET | Freq: Once | ORAL | Status: AC
  Administered 2013-01-28: 1 via ORAL
  Filled 2013-01-28: qty 1

## 2013-01-28 MED ORDER — SODIUM CHLORIDE 0.9 % IV SOLN
Freq: Once | INTRAVENOUS | Status: AC
  Administered 2013-01-28: 19:00:00 via INTRAVENOUS

## 2013-01-28 MED ORDER — PROMETHAZINE HCL 25 MG PO TABS: 25.0000 mg | ORAL_TABLET | Freq: Four times a day (QID) | ORAL | Status: DC | PRN

## 2013-01-28 MED ORDER — HYDROMORPHONE HCL PF 1 MG/ML IJ SOLN
0.5000 mg | Freq: Once | INTRAMUSCULAR | Status: AC
  Administered 2013-01-28: 0.5 mg via INTRAVENOUS
  Filled 2013-01-28: qty 1

## 2013-01-28 MED ORDER — RANITIDINE HCL 150 MG PO CAPS: 150.0000 mg | ORAL_CAPSULE | Freq: Two times a day (BID) | ORAL | Status: DC

## 2013-01-28 MED ORDER — ONDANSETRON HCL 4 MG/2ML IJ SOLN
4.0000 mg | Freq: Once | INTRAMUSCULAR | Status: AC
  Administered 2013-01-28: 4 mg via INTRAVENOUS
  Filled 2013-01-28: qty 2

## 2013-01-28 MED ORDER — SODIUM CHLORIDE 0.9 % IV BOLUS (SEPSIS)
1000.0000 mL | Freq: Once | INTRAVENOUS | Status: AC
  Administered 2013-01-28: 1000 mL via INTRAVENOUS

## 2013-01-28 NOTE — ED Provider Notes (Signed)
History     CSN: [id number]  Arrival date & time 01/28/13  1656   First MD Initiated Contact with Patient 01/28/13 1704      Chief Complaint  Patient presents with  . Back Pain  . Emesis    (Consider location/radiation/quality/duration/timing/severity/associated sxs/prior treatment) Patient is a 42 y.o. female presenting with vomiting. The history is provided by the patient (pt complains of vomiting and diarhea).  Emesis Severity:  Moderate Timing:  Intermittent Quality:  Bilious material Able to tolerate:  Liquids Progression:  Unchanged Chronicity:  New Recent urination:  Normal Relieved by:  Nothing Associated symptoms: diarrhea   Associated symptoms: no abdominal pain and no headaches     Past Medical History  Diagnosis Date  . Hypertension   . Diabetes mellitus   . Substance abuse     pt denies  . Suicidal thoughts     pt denies    Past Surgical History  Procedure Laterality Date  . Abdominal hysterectomy    . Cesarean section      x 2    Family History  Problem Relation Age of Onset  . Diabetes Other   . Heart disease    . Arthritis    . Lung disease    . Cancer      History  Substance Use Topics  . Smoking status: Current Every Day Smoker -- 0.50 packs/day for 25 years    Types: Cigarettes  . Smokeless tobacco: Never Used  . Alcohol Use: No     Comment: ocsional    OB History   Grav Para Term Preterm Abortions TAB SAB Ect Mult Living   5 2 2  3 1 2   2       Review of Systems  Constitutional: Negative for fatigue.  HENT: Negative for congestion, sinus pressure and ear discharge.   Eyes: Negative for discharge.  Respiratory: Negative for cough.   Cardiovascular: Negative for chest pain.  Gastrointestinal: Positive for vomiting and diarrhea. Negative for abdominal pain.  Genitourinary: Negative for frequency and hematuria.  Musculoskeletal: Negative for back pain.  Skin: Negative for rash.  Neurological: Negative for seizures and  headaches.  Psychiatric/Behavioral: Negative for hallucinations.    Allergies  Codeine and Doxycycline  Home Medications   Current Outpatient Rx  Name  Route  Sig  Dispense  Refill  . glipiZIDE (GLUCOTROL XL) 2.5 MG 24 hr tablet   Oral   Take 2.5 mg by mouth daily.         . hydrOXYzine (ATARAX/VISTARIL) 25 MG tablet   Oral   Take 1 tablet (25 mg total) by mouth every 8 (eight) hours as needed for itching.   12 tablet   0   . lisinopril (PRINIVIL,ZESTRIL) 5 MG tablet   Oral   Take 5 mg by mouth 2 (two) times daily.         . promethazine (PHENERGAN) 25 MG tablet   Oral   Take 1 tablet (25 mg total) by mouth every 6 (six) hours as needed for nausea.   15 tablet   0   . ranitidine (ZANTAC) 150 MG capsule   Oral   Take 1 capsule (150 mg total) by mouth 2 (two) times daily.   30 capsule   0   . traMADol (ULTRAM) 50 MG tablet   Oral   Take 1 tablet (50 mg total) by mouth every 6 (six) hours as needed for pain.   15 tablet   0  BP 128/75  Pulse 82  Temp(Src) 97.9 F (36.6 C) (Oral)  Resp 18  Ht 5\' 9"  (1.753 m)  Wt 202 lb (91.627 kg)  BMI 29.82 kg/m2  SpO2 99%  Physical Exam  Constitutional: She is oriented to person, place, and time. She appears well-developed.  HENT:  Head: Normocephalic and atraumatic.  Eyes: Conjunctivae and EOM are normal. No scleral icterus.  Neck: Neck supple. No thyromegaly present.  Cardiovascular: Normal rate and regular rhythm.  Exam reveals no gallop and no friction rub.   No murmur heard. Pulmonary/Chest: No stridor. She has no wheezes. She has no rales. She exhibits no tenderness.  Abdominal: She exhibits no distension. There is tenderness. There is no rebound.  Minimal diffuse tenerness  Musculoskeletal: Normal range of motion. She exhibits no edema.  Lymphadenopathy:    She has no cervical adenopathy.  Neurological: She is oriented to person, place, and time. Coordination normal.  Skin: No rash noted. No erythema.   Psychiatric: She has a normal mood and affect. Her behavior is normal.    ED Course  Procedures (including critical care time)  Labs Reviewed  CBC WITH DIFFERENTIAL - Abnormal; Notable for the following:    RBC 5.58 (*)    Hemoglobin 15.4 (*)    RDW 15.6 (*)    All other components within normal limits  COMPREHENSIVE METABOLIC PANEL - Abnormal; Notable for the following:    Glucose, Bld 100 (*)    Albumin 3.2 (*)    Total Bilirubin 0.2 (*)    GFR calc non Af Amer 81 (*)    All other components within normal limits  LIPASE, BLOOD  URINALYSIS, ROUTINE W REFLEX MICROSCOPIC   Dg Abd Acute W/chest  01/28/2013  *RADIOLOGY REPORT*  Clinical Data: Abdominal pain, nausea, vomiting.  ACUTE ABDOMEN SERIES (ABDOMEN 2 VIEW & CHEST 1 VIEW)  Comparison: Chest x-ray 08/17/2012  Findings: There is normal bowel gas pattern.  No free air.  No organomegaly or suspicious calcification.  No acute bony abnormality.  Focal airspace opacity noted in the left base concerning for pneumonia.  No focal opacity on the right.  No effusions.  Heart is normal size.  IMPRESSION: Findings concerning for left basilar pneumonia.  No evidence of bowel obstruction or free air.   Original Report Authenticated By: Charlett Nose, M.D.      1. Vomiting    Pt has no cough, sob or fever.   MDM  Gastroenteritis,         Benny Lennert, MD 01/28/13 2033

## 2013-01-28 NOTE — ED Notes (Signed)
Pt resting in bed, states both pain and nausea under control at this time, no needs voiced. Abdomen soft and non distended at this time. Denies emesis since admission here.

## 2013-01-28 NOTE — ED Notes (Signed)
Nausea, mid back pain. Sx's x 3 days. Vomited a lot yesterday . Once today. Nad. Denies injury to back/heavy lifting. No changes in gu. Diarrhea x 2 days ago none since

## 2013-01-28 NOTE — ED Notes (Signed)
Pt ambulatory to restroom, urine sample obtained

## 2013-01-28 NOTE — ED Notes (Signed)
Pt complaining of pain at this time, dr zammit notified and wrote patient for a percocet to take prior to leaving.

## 2013-01-28 NOTE — ED Notes (Signed)
Pt c/o mid back pain that is worse with movement, only able to lay in certain positions, n/v/d, cough that is productive with "black" sputum production. Symptoms started a few days ago, denies any injury.

## 2013-01-28 NOTE — ED Notes (Signed)
Pt unable to give urine sample at present time 

## 2013-02-25 ENCOUNTER — Emergency Department (HOSPITAL_COMMUNITY)
Admission: EM | Admit: 2013-02-25 | Discharge: 2013-02-25 | Disposition: A | Discharge status: Home / Self Care | Payer: Self-pay | Attending: Emergency Medicine | Admitting: Emergency Medicine

## 2013-02-25 ENCOUNTER — Emergency Department (HOSPITAL_COMMUNITY): Payer: Self-pay

## 2013-02-25 ENCOUNTER — Encounter (HOSPITAL_COMMUNITY): Payer: Self-pay | Admitting: *Deleted

## 2013-02-25 DIAGNOSIS — I10 Essential (primary) hypertension: Secondary | ICD-10-CM | POA: Insufficient documentation

## 2013-02-25 DIAGNOSIS — F17200 Nicotine dependence, unspecified, uncomplicated: Secondary | ICD-10-CM | POA: Insufficient documentation

## 2013-02-25 DIAGNOSIS — J3489 Other specified disorders of nose and nasal sinuses: Secondary | ICD-10-CM | POA: Insufficient documentation

## 2013-02-25 DIAGNOSIS — Z79899 Other long term (current) drug therapy: Secondary | ICD-10-CM | POA: Insufficient documentation

## 2013-02-25 DIAGNOSIS — R0602 Shortness of breath: Secondary | ICD-10-CM | POA: Insufficient documentation

## 2013-02-25 DIAGNOSIS — J40 Bronchitis, not specified as acute or chronic: Secondary | ICD-10-CM

## 2013-02-25 DIAGNOSIS — J45901 Unspecified asthma with (acute) exacerbation: Secondary | ICD-10-CM | POA: Insufficient documentation

## 2013-02-25 DIAGNOSIS — E119 Type 2 diabetes mellitus without complications: Secondary | ICD-10-CM | POA: Insufficient documentation

## 2013-02-25 HISTORY — DX: Unspecified asthma, uncomplicated: J45.909

## 2013-02-25 MED ORDER — AMOXICILLIN 500 MG PO CAPS: 500.0000 mg | ORAL_CAPSULE | Freq: Three times a day (TID) | ORAL | Status: DC

## 2013-02-25 MED ORDER — GLIPIZIDE ER 2.5 MG PO TB24: 2.5000 mg | ORAL_TABLET | Freq: Every day | ORAL | Status: DC

## 2013-02-25 MED ORDER — ALBUTEROL SULFATE (5 MG/ML) 0.5% IN NEBU
5.0000 mg | INHALATION_SOLUTION | Freq: Once | RESPIRATORY_TRACT | Status: AC
  Administered 2013-02-25: 5 mg via RESPIRATORY_TRACT
  Filled 2013-02-25: qty 1

## 2013-02-25 MED ORDER — ALBUTEROL SULFATE 0.63 MG/3ML IN NEBU: 1.0000 | INHALATION_SOLUTION | Freq: Four times a day (QID) | RESPIRATORY_TRACT | Status: DC | PRN

## 2013-02-25 MED ORDER — ALBUTEROL SULFATE HFA 108 (90 BASE) MCG/ACT IN AERS
2.0000 | INHALATION_SPRAY | Freq: Once | RESPIRATORY_TRACT | Status: AC
  Administered 2013-02-25: 2 via RESPIRATORY_TRACT
  Filled 2013-02-25: qty 6.7

## 2013-02-25 MED ORDER — IPRATROPIUM BROMIDE 0.02 % IN SOLN
0.5000 mg | Freq: Once | RESPIRATORY_TRACT | Status: AC
  Administered 2013-02-25: 0.5 mg via RESPIRATORY_TRACT
  Filled 2013-02-25: qty 2.5

## 2013-02-25 MED ORDER — HYDROCOD POLST-CHLORPHEN POLST 10-8 MG/5ML PO LQCR
5.0000 mL | Freq: Once | ORAL | Status: AC
  Administered 2013-02-25: 5 mL via ORAL
  Filled 2013-02-25: qty 5

## 2013-02-25 NOTE — ED Notes (Signed)
Pt c/o cough, wheezing that is productive with yellow sputum for the past week, has been using inhalers and breathing tx at home with no improvement, denies any fever.

## 2013-02-25 NOTE — ED Provider Notes (Signed)
History     CSN: [id number]  Arrival date & time 02/25/13  0102   None     Chief Complaint  Patient presents with  . Cough    (Consider location/radiation/quality/duration/timing/severity/associated sxs/prior treatment) HPI Comments: Patient with hx of productive cough, wheezing and hx of recurrent bronchitis c/o productive cough for one week.  Has used albuterol nebs at home w/o relief and states she has ran out of albuterol vials. She has intermittent shortness of breath with excessive coughing only.,   She denies chest pain, fever, vomiting, hemoptysis or persistent shortness of breath.    Patient also states she is out of her glipizide and requests refill until her upcoming appt at the health dept  Patient is a 42 y.o. female presenting with cough. The history is provided by the patient.  Cough Cough characteristics:  Productive Sputum characteristics:  Yellow Severity:  Moderate Onset quality:  Gradual Duration:  1 week Timing:  Intermittent Progression:  Unchanged Chronicity:  Recurrent Smoker: yes   Context: upper respiratory infection   Context: not animal exposure, not sick contacts and not smoke exposure   Relieved by:  Nothing Worsened by:  Lying down and smoking Ineffective treatments:  Beta-agonist inhaler Associated symptoms: myalgias, rhinorrhea, shortness of breath and wheezing   Associated symptoms: no chest pain, no chills, no ear pain, no fever, no headaches, no rash, no sinus congestion and no sore throat   Wheezing:    Severity:  Moderate   Onset quality:  Gradual   Duration:  1 week   Timing:  Intermittent   Progression:  Unchanged   Chronicity:  Recurrent   Past Medical History  Diagnosis Date  . Hypertension   . Diabetes mellitus   . Substance abuse     pt denies  . Suicidal thoughts     pt denies  . Asthma     Past Surgical History  Procedure Laterality Date  . Abdominal hysterectomy    . Cesarean section      x 2    Family  History  Problem Relation Age of Onset  . Diabetes Other   . Heart disease    . Arthritis    . Lung disease    . Cancer      History  Substance Use Topics  . Smoking status: Current Every Day Smoker -- 0.50 packs/day for 25 years    Types: Cigarettes  . Smokeless tobacco: Never Used  . Alcohol Use: No     Comment: ocsional    OB History   Grav Para Term Preterm Abortions TAB SAB Ect Mult Living   5 2 2  3 1 2   2       Review of Systems  Constitutional: Negative for fever, chills, activity change and appetite change.  HENT: Positive for congestion and rhinorrhea. Negative for ear pain, sore throat, facial swelling, trouble swallowing, neck pain and neck stiffness.   Eyes: Negative for visual disturbance.  Respiratory: Positive for cough, shortness of breath and wheezing. Negative for chest tightness and stridor.   Cardiovascular: Negative for chest pain.  Gastrointestinal: Negative for nausea, vomiting and abdominal pain.  Genitourinary: Negative for frequency and flank pain.  Musculoskeletal: Positive for myalgias.  Skin: Negative.  Negative for rash.  Neurological: Negative for dizziness, weakness, numbness and headaches.  Hematological: Negative for adenopathy.  Psychiatric/Behavioral: Negative for confusion.  All other systems reviewed and are negative.    Allergies  Codeine and Doxycycline  Home Medications   Current  Outpatient Rx  Name  Route  Sig  Dispense  Refill  . lisinopril (PRINIVIL,ZESTRIL) 5 MG tablet   Oral   Take 5 mg by mouth daily.          Marland Kitchen glipiZIDE (GLUCOTROL XL) 2.5 MG 24 hr tablet   Oral   Take 2.5 mg by mouth daily.         . promethazine (PHENERGAN) 25 MG tablet   Oral   Take 1 tablet (25 mg total) by mouth every 6 (six) hours as needed for nausea.   15 tablet   0   . ranitidine (ZANTAC) 150 MG capsule   Oral   Take 1 capsule (150 mg total) by mouth 2 (two) times daily.   30 capsule   0   . traMADol (ULTRAM) 50 MG  tablet   Oral   Take 1 tablet (50 mg total) by mouth every 6 (six) hours as needed for pain.   15 tablet   0     BP 122/54  Pulse 90  Temp(Src) 97.9 F (36.6 C) (Oral)  Resp 20  Ht 5\' 9"  (1.753 m)  Wt 202 lb (91.627 kg)  BMI 29.82 kg/m2  SpO2 97%  Physical Exam  Nursing note and vitals reviewed. Constitutional: She is oriented to person, place, and time. She appears well-developed and well-nourished. No distress.  HENT:  Head: Normocephalic and atraumatic.  Right Ear: Tympanic membrane and ear canal normal.  Left Ear: Tympanic membrane and ear canal normal.  Mouth/Throat: Uvula is midline, oropharynx is clear and moist and mucous membranes are normal. No oropharyngeal exudate.  Eyes: EOM are normal. Pupils are equal, round, and reactive to light.  Neck: Normal range of motion. Neck supple.  Cardiovascular: Normal rate, regular rhythm, normal heart sounds and intact distal pulses.   No murmur heard. Pulmonary/Chest: Effort normal. No respiratory distress. She has wheezes. She has no rales. She exhibits no tenderness.  Coarse lungs sounds bilaterally. No rales, few scattered expiratory wheezing  Abdominal: Soft. She exhibits no distension. There is no tenderness. There is no rebound and no guarding.  Musculoskeletal: Normal range of motion. She exhibits no edema.  Lymphadenopathy:    She has no cervical adenopathy.  Neurological: She is alert and oriented to person, place, and time. She exhibits normal muscle tone. Coordination normal.  Skin: Skin is warm and dry.    ED Course  Procedures (including critical care time)  Labs Reviewed - No data to display No results found.   Dg Chest 2 View  02/25/2013  *RADIOLOGY REPORT*  Clinical Data: Cough for 4 days, current smoker  CHEST - 2 VIEW  Comparison: 01/28/2013; 11/22/2011; 01/05/2009  Findings: Grossly unchanged cardiac silhouette and mediastinal contours.  Grossly unchanged bibasilar opacities favored to represent  atelectasis or scar.  No new focal airspace opacity. Unchanged mild thickening of the pulmonary interstitium.  No pleural effusion or pneumothorax.  No definite evidence of edema. Unchanged bones.  IMPRESSION: Findings suggestive of airways disease.  No focal airspace opacities to suggest pneumonia.   Original Report Authenticated By: Tacey Ruiz, MD      MDM   Patient is feeling better after neb, tussionex and requesting to go home.  No acute findings on x-ray.  Dispensed inhaler for home use until patient can afford albuterol vials and refill patient's glipizide  She is well appearing, has been drinking fluids.  Appears stable for discharge.  Agrees to close f/u with her PMD.  Denasia Venn L. Trisha Mangle, PA-C 02/27/13 1810

## 2013-02-27 NOTE — ED Notes (Signed)
Pt requested albuterol acuneb be changed to regular albuterol.  Spoke with Dr. Estell Harpin, ok to change to albuterol 2.5mg /61ml, dispense 75ml.  Called into [place].

## 2013-02-28 NOTE — ED Provider Notes (Signed)
Medical screening examination/treatment/procedure(s) were performed by non-physician practitioner and as supervising physician I was immediately available for consultation/collaboration.   Shelda Jakes, MD 02/28/13 [phone]

## 2013-03-30 ENCOUNTER — Encounter (HOSPITAL_COMMUNITY): Payer: Self-pay | Admitting: *Deleted

## 2013-03-30 ENCOUNTER — Emergency Department (HOSPITAL_COMMUNITY)
Admission: EM | Admit: 2013-03-30 | Discharge: 2013-03-30 | Disposition: A | Discharge status: Home / Self Care | Payer: Self-pay | Attending: Emergency Medicine | Admitting: Emergency Medicine

## 2013-03-30 DIAGNOSIS — I10 Essential (primary) hypertension: Secondary | ICD-10-CM | POA: Insufficient documentation

## 2013-03-30 DIAGNOSIS — R450 Nervousness: Secondary | ICD-10-CM | POA: Insufficient documentation

## 2013-03-30 DIAGNOSIS — J45901 Unspecified asthma with (acute) exacerbation: Secondary | ICD-10-CM | POA: Insufficient documentation

## 2013-03-30 DIAGNOSIS — Z79899 Other long term (current) drug therapy: Secondary | ICD-10-CM | POA: Insufficient documentation

## 2013-03-30 DIAGNOSIS — F411 Generalized anxiety disorder: Secondary | ICD-10-CM | POA: Insufficient documentation

## 2013-03-30 DIAGNOSIS — L02219 Cutaneous abscess of trunk, unspecified: Secondary | ICD-10-CM | POA: Insufficient documentation

## 2013-03-30 DIAGNOSIS — Z8659 Personal history of other mental and behavioral disorders: Secondary | ICD-10-CM | POA: Insufficient documentation

## 2013-03-30 DIAGNOSIS — E119 Type 2 diabetes mellitus without complications: Secondary | ICD-10-CM | POA: Insufficient documentation

## 2013-03-30 MED ORDER — LIDOCAINE HCL (PF) 2 % IJ SOLN
10.0000 mL | Freq: Once | INTRAMUSCULAR | Status: AC
  Administered 2013-03-30: 10 mL
  Filled 2013-03-30: qty 10

## 2013-03-30 MED ORDER — CLINDAMYCIN HCL 150 MG PO CAPS
300.0000 mg | ORAL_CAPSULE | Freq: Once | ORAL | Status: AC
  Administered 2013-03-30: 300 mg via ORAL
  Filled 2013-03-30: qty 2

## 2013-03-30 MED ORDER — PENICILLIN V POTASSIUM 250 MG PO TABS
500.0000 mg | ORAL_TABLET | Freq: Once | ORAL | Status: AC
  Administered 2013-03-30: 500 mg via ORAL
  Filled 2013-03-30: qty 2

## 2013-03-30 MED ORDER — OXYCODONE-ACETAMINOPHEN 5-325 MG PO TABS: 1.0000 | ORAL_TABLET | Freq: Four times a day (QID) | ORAL | Status: DC | PRN

## 2013-03-30 MED ORDER — OXYCODONE-ACETAMINOPHEN 5-325 MG PO TABS
1.0000 | ORAL_TABLET | Freq: Once | ORAL | Status: AC
  Administered 2013-03-30: 1 via ORAL
  Filled 2013-03-30: qty 1

## 2013-03-30 MED ORDER — AMOXICILLIN 500 MG PO CAPS: 500.0000 mg | ORAL_CAPSULE | Freq: Three times a day (TID) | ORAL | Status: DC

## 2013-03-30 MED ORDER — CLINDAMYCIN HCL 150 MG PO CAPS: 150.0000 mg | ORAL_CAPSULE | Freq: Three times a day (TID) | ORAL | Status: DC

## 2013-03-30 NOTE — ED Notes (Signed)
Pt given paper pants to wear home. Per her request. Given graham crackers and diet coke.

## 2013-03-30 NOTE — ED Notes (Signed)
Pt w/ abcess to perineum area for 2 weeks, has been draining at times.  Denies any fever or chills.

## 2013-03-30 NOTE — ED Notes (Signed)
Reports hard nodules on perineum, groin.  One has "busted".  Non-puritic, but painful.  Has been present x 2 weeks.  Using warm soaks.

## 2013-03-30 NOTE — ED Provider Notes (Signed)
Medical screening examination/treatment/procedure(s) were performed by non-physician practitioner and as supervising physician I was immediately available for consultation/collaboration.  Donnetta Hutching, MD 03/30/13 1155

## 2013-03-30 NOTE — ED Provider Notes (Signed)
History     CSN: [id number]  Arrival date & time 03/30/13  7829   First MD Initiated Contact with Patient 03/30/13 [phone]      Chief Complaint  Patient presents with  . Abscess    (Consider location/radiation/quality/duration/timing/severity/associated sxs/prior treatment) Patient is a 42 y.o. female presenting with abscess. The history is provided by the patient.  Abscess Location:  Ano-genital Ano-genital abscess location:  Perineum Abscess quality: draining, painful and warmth   Red streaking: no   Duration:  2 weeks Progression:  Worsening Pain details:    Quality:  Throbbing   Severity:  Moderate   Timing:  Intermittent   Progression:  Worsening Chronicity:  Recurrent Context: diabetes   Relieved by:  Nothing Worsened by:  Draining/squeezing Ineffective treatments:  None tried Associated symptoms: no fever, no nausea and no vomiting   Risk factors: prior abscess   Risk factors: no hx of MRSA     Past Medical History  Diagnosis Date  . Hypertension   . Diabetes mellitus   . Substance abuse     pt denies  . Suicidal thoughts     pt denies  . Asthma     Past Surgical History  Procedure Laterality Date  . Abdominal hysterectomy    . Cesarean section      x 2    Family History  Problem Relation Age of Onset  . Diabetes Other   . Heart disease    . Arthritis    . Lung disease    . Cancer      History  Substance Use Topics  . Smoking status: Current Every Day Smoker -- 0.50 packs/day for 25 years    Types: Cigarettes  . Smokeless tobacco: Never Used  . Alcohol Use: Yes     Comment: ocsional    OB History   Grav Para Term Preterm Abortions TAB SAB Ect Mult Living   5 2 2  3 1 2   2       Review of Systems  Constitutional: Negative for fever and activity change.       All ROS Neg except as noted in HPI  HENT: Negative for nosebleeds and neck pain.   Eyes: Negative for photophobia and discharge.  Respiratory: Positive for wheezing. Negative  for cough and shortness of breath.   Cardiovascular: Negative for chest pain and palpitations.  Gastrointestinal: Negative for nausea, vomiting, abdominal pain and blood in stool.  Genitourinary: Negative for dysuria, frequency and hematuria.  Musculoskeletal: Negative for back pain and arthralgias.  Skin: Positive for wound.  Neurological: Negative for dizziness, seizures and speech difficulty.  Psychiatric/Behavioral: Negative for hallucinations and confusion. The patient is nervous/anxious.     Allergies  Codeine and Doxycycline  Home Medications   Current Outpatient Rx  Name  Route  Sig  Dispense  Refill  . albuterol (ACCUNEB) 0.63 MG/3ML nebulizer solution   Nebulization   Take 3 mLs (0.63 mg total) by nebulization every 6 (six) hours as needed for wheezing.   75 mL   0   . glipiZIDE (GLUCOTROL XL) 2.5 MG 24 hr tablet   Oral   Take 2.5 mg by mouth daily.         Marland Kitchen lisinopril (PRINIVIL,ZESTRIL) 5 MG tablet   Oral   Take 5 mg by mouth daily.          . ranitidine (ZANTAC) 150 MG capsule   Oral   Take 1 capsule (150 mg total) by mouth 2 (two)  times daily.   30 capsule   0   . amoxicillin (AMOXIL) 500 MG capsule   Oral   Take 1 capsule (500 mg total) by mouth 3 (three) times daily.   30 capsule   0   . promethazine (PHENERGAN) 25 MG tablet   Oral   Take 1 tablet (25 mg total) by mouth every 6 (six) hours as needed for nausea.   15 tablet   0   . traMADol (ULTRAM) 50 MG tablet   Oral   Take 1 tablet (50 mg total) by mouth every 6 (six) hours as needed for pain.   15 tablet   0     BP 128/86  Pulse 82  Temp(Src) 97.9 F (36.6 C) (Oral)  Resp 16  Ht 5\' 9"  (1.753 m)  Wt 207 lb (93.895 kg)  BMI 30.55 kg/m2  SpO2 100%  Physical Exam  Nursing note and vitals reviewed. Constitutional: She is oriented to person, place, and time. She appears well-developed and well-nourished.  Non-toxic appearance.  HENT:  Head: Normocephalic.  Right Ear: Tympanic  membrane and external ear normal.  Left Ear: Tympanic membrane and external ear normal.  Eyes: EOM and lids are normal. Pupils are equal, round, and reactive to light.  Neck: Normal range of motion. Neck supple. Carotid bruit is not present.  Cardiovascular: Normal rate, regular rhythm, normal heart sounds, intact distal pulses and normal pulses.   Pulmonary/Chest: Breath sounds normal. No respiratory distress.  Abdominal: Soft. Bowel sounds are normal. There is no tenderness. There is no guarding.    Genitourinary:     Musculoskeletal: Normal range of motion.  Lymphadenopathy:       Head (right side): No submandibular adenopathy present.       Head (left side): No submandibular adenopathy present.    She has no cervical adenopathy.  Neurological: She is alert and oriented to person, place, and time. She has normal strength. No cranial nerve deficit or sensory deficit.  Skin: Skin is warm and dry.  Psychiatric: She has a normal mood and affect. Her speech is normal.    ED Course  INCISION AND DRAINAGE Date/Time: 03/30/2013 9:25 AM Performed by: Kathie Dike Authorized by: Kathie Dike Consent: Verbal consent obtained. Risks and benefits: risks, benefits and alternatives were discussed Consent given by: patient Patient understanding: patient states understanding of the procedure being performed Patient identity confirmed: arm band Time out: Immediately prior to procedure a "time out" was called to verify the correct patient, procedure, equipment, support staff and site/side marked as required. Type: abscess Body area: anogenital Anesthesia: local infiltration Local anesthetic: lidocaine 2% without epinephrine Patient sedated: no Scalpel size: 11 Incision type: single straight Complexity: simple Drainage: purulent Drainage amount: scant Wound treatment: wound left open Patient tolerance: Patient tolerated the procedure well with no immediate complications.   :    Labs Reviewed - No data to display No results found.   No diagnosis found.    MDM  I have reviewed nursing notes, vital signs, and all appropriate lab and imaging results for this patient. Patient presents to the emergency department with 3 small abscess areas. The one on the pubis was eligible for incision and drainage. A culture was sent to the lab. Patient placed on clindamycin and penicillin, also Percocet for pain. Patient is to start warm tub soaks on tomorrow. She will see her primary physician, or return to the emergency department if any changes, problems, or concerns.  Kathie Dike, PA-C 03/30/13 [phone]

## 2013-04-30 ENCOUNTER — Other Ambulatory Visit (HOSPITAL_COMMUNITY): Payer: Self-pay | Admitting: Nurse Practitioner

## 2013-04-30 DIAGNOSIS — N921 Excessive and frequent menstruation with irregular cycle: Secondary | ICD-10-CM

## 2013-04-30 DIAGNOSIS — R102 Pelvic and perineal pain: Secondary | ICD-10-CM

## 2013-05-03 ENCOUNTER — Ambulatory Visit (HOSPITAL_COMMUNITY): Payer: Self-pay

## 2013-05-03 ENCOUNTER — Other Ambulatory Visit (HOSPITAL_COMMUNITY): Payer: Self-pay | Admitting: Nurse Practitioner

## 2013-05-03 ENCOUNTER — Ambulatory Visit (HOSPITAL_COMMUNITY)
Admission: RE | Admit: 2013-05-03 | Discharge: 2013-05-03 | Disposition: A | Discharge status: Home / Self Care | Payer: Self-pay | Source: Ambulatory Visit | Attending: Nurse Practitioner | Admitting: Nurse Practitioner

## 2013-05-03 DIAGNOSIS — R102 Pelvic and perineal pain: Secondary | ICD-10-CM

## 2013-05-03 DIAGNOSIS — N921 Excessive and frequent menstruation with irregular cycle: Secondary | ICD-10-CM

## 2013-05-03 DIAGNOSIS — N83209 Unspecified ovarian cyst, unspecified side: Secondary | ICD-10-CM | POA: Insufficient documentation

## 2013-05-03 DIAGNOSIS — N949 Unspecified condition associated with female genital organs and menstrual cycle: Secondary | ICD-10-CM | POA: Insufficient documentation

## 2013-05-13 ENCOUNTER — Emergency Department (HOSPITAL_COMMUNITY)
Admission: EM | Admit: 2013-05-13 | Discharge: 2013-05-14 | Disposition: A | Discharge status: Home / Self Care | Payer: Self-pay | Attending: Emergency Medicine | Admitting: Emergency Medicine

## 2013-05-13 ENCOUNTER — Encounter (HOSPITAL_COMMUNITY): Payer: Self-pay | Admitting: *Deleted

## 2013-05-13 DIAGNOSIS — F1910 Other psychoactive substance abuse, uncomplicated: Secondary | ICD-10-CM | POA: Insufficient documentation

## 2013-05-13 DIAGNOSIS — E119 Type 2 diabetes mellitus without complications: Secondary | ICD-10-CM | POA: Insufficient documentation

## 2013-05-13 DIAGNOSIS — F17200 Nicotine dependence, unspecified, uncomplicated: Secondary | ICD-10-CM | POA: Insufficient documentation

## 2013-05-13 DIAGNOSIS — Z79899 Other long term (current) drug therapy: Secondary | ICD-10-CM | POA: Insufficient documentation

## 2013-05-13 DIAGNOSIS — J45909 Unspecified asthma, uncomplicated: Secondary | ICD-10-CM | POA: Insufficient documentation

## 2013-05-13 DIAGNOSIS — L02412 Cutaneous abscess of left axilla: Secondary | ICD-10-CM

## 2013-05-13 DIAGNOSIS — IMO0002 Reserved for concepts with insufficient information to code with codable children: Secondary | ICD-10-CM | POA: Insufficient documentation

## 2013-05-13 DIAGNOSIS — I10 Essential (primary) hypertension: Secondary | ICD-10-CM | POA: Insufficient documentation

## 2013-05-13 NOTE — ED Notes (Signed)
Pt has a boil under left armpit.

## 2013-05-14 MED ORDER — LIDOCAINE HCL (PF) 1 % IJ SOLN
INTRAMUSCULAR | Status: AC
  Administered 2013-05-14: 03:00:00
  Filled 2013-05-14: qty 5

## 2013-05-14 NOTE — ED Notes (Signed)
Pt alert & oriented x4, stable gait. Patient given discharge instructions, paperwork & prescription(s). Patient  instructed to stop at the registration desk to finish any additional paperwork. Patient verbalized understanding. Pt left department w/ no further questions. 

## 2013-05-14 NOTE — ED Provider Notes (Signed)
INCISION AND DRAINAGE Performed by: Burgess Amor Consent: Verbal consent obtained. Risks and benefits: risks, benefits and alternatives were discussed Type: abscess  Body area: left axilla  Anesthesia: local infiltration  Incision was made with a scalpel.  Local anesthetic: lidocaine 1% without epinephrine  Anesthetic total: 2 ml  Complexity: complex Blunt dissection to break up loculations  Drainage: purulent  Drainage amount: moderate,  Liquid pus and thick cheesy sebum  Packing material: 1/2 in iodoform gauze  Patient tolerance: Patient tolerated the procedure well with no immediate complications.     Burgess Amor, PA-C 05/14/13 [phone]

## 2013-05-14 NOTE — ED Notes (Signed)
Pt states she also has red, painful area to buttock and " private area".  States they just started and do not need to be lanced.

## 2013-05-14 NOTE — ED Provider Notes (Signed)
History    CSN: [id number] Arrival date & time 05/13/13  2208  First MD Initiated Contact with Patient 05/13/13 2359     Chief Complaint  Patient presents with  . Recurrent Skin Infections   (Consider location/radiation/quality/duration/timing/severity/associated sxs/prior Treatment) HPI HPI Comments: Katrina Good is a 42 y.o. female who presents to the Emergency Department complaining of left axilla abscess she has had for a week. She had an abscess on the groin that was lanced a week ago and continues on two different antibiotics. The abscess under her arm has gotten larger and is more painful.    PCP Kizzie Furnish Past Medical History  Diagnosis Date  . Hypertension   . Diabetes mellitus   . Substance abuse     pt denies  . Suicidal thoughts     pt denies  . Asthma    Past Surgical History  Procedure Laterality Date  . Abdominal hysterectomy    . Cesarean section      x 2   Family History  Problem Relation Age of Onset  . Diabetes Other   . Heart disease    . Arthritis    . Lung disease    . Cancer     History  Substance Use Topics  . Smoking status: Current Every Day Smoker -- 0.50 packs/day for 25 years    Types: Cigarettes  . Smokeless tobacco: Never Used  . Alcohol Use: Yes     Comment: ocsional   OB History   Grav Para Term Preterm Abortions TAB SAB Ect Mult Living   5 2 2  3 1 2   2      Review of Systems  Constitutional: Negative for fever.       10 Systems reviewed and are negative for acute change except as noted in the HPI.  HENT: Negative for congestion.   Eyes: Negative for discharge and redness.  Respiratory: Negative for cough and shortness of breath.   Cardiovascular: Negative for chest pain.  Gastrointestinal: Negative for vomiting and abdominal pain.  Musculoskeletal: Negative for back pain.       Abscess under left arm  Skin: Negative for rash.  Neurological: Negative for syncope, numbness and headaches.   Psychiatric/Behavioral:       No behavior change.    Allergies  Codeine and Doxycycline  Home Medications   Current Outpatient Rx  Name  Route  Sig  Dispense  Refill  . glipiZIDE (GLUCOTROL XL) 2.5 MG 24 hr tablet   Oral   Take 2.5 mg by mouth daily.         Marland Kitchen lisinopril (PRINIVIL,ZESTRIL) 5 MG tablet   Oral   Take 5 mg by mouth daily.          Marland Kitchen oxyCODONE-acetaminophen (PERCOCET/ROXICET) 5-325 MG per tablet   Oral   Take 1 tablet by mouth every 6 (six) hours as needed for pain.   20 tablet   0   . PENICILLIN V POTASSIUM PO   Oral   Take 1 tablet by mouth 2 (two) times daily.         . ranitidine (ZANTAC) 150 MG capsule   Oral   Take 1 capsule (150 mg total) by mouth 2 (two) times daily.   30 capsule   0   . albuterol (ACCUNEB) 0.63 MG/3ML nebulizer solution   Nebulization   Take 3 mLs (0.63 mg total) by nebulization every 6 (six) hours as needed for wheezing.   75 mL  0    BP 129/88  Pulse 91  Temp(Src) 98 F (36.7 C) (Oral)  Resp 20  Ht 5\' 9"  (1.753 m)  Wt 198 lb 9.6 oz (90.084 kg)  BMI 29.31 kg/m2  SpO2 100% Physical Exam  Nursing note and vitals reviewed. Constitutional: She appears well-developed and well-nourished.  Awake, alert, nontoxic appearance.  HENT:  Head: Normocephalic and atraumatic.  Eyes: EOM are normal. Pupils are equal, round, and reactive to light.  Neck: Normal range of motion. Neck supple.  Cardiovascular: Normal rate and intact distal pulses.   Pulmonary/Chest: Effort normal and breath sounds normal. She exhibits no tenderness.  Abdominal: Soft. Bowel sounds are normal. There is no tenderness. There is no rebound.  Musculoskeletal: She exhibits no tenderness.  Baseline ROM, no obvious new focal weakness.  Neurological:  Mental status and motor strength appears baseline for patient and situation.  Skin: No rash noted.  2 cm x 1 cm abscess to left axilla with erythema and tenderness.  Psychiatric: She has a normal mood  and affect.    ED Course  Procedures (including critical care time)   MDM  Patient with abscess to left axilla. I&D performed by midlevel. Patient remains on antibiotics. Pt stable in ED with no significant deterioration in condition.The patient appears reasonably screened and/or stabilized for discharge and I doubt any other medical condition or other [facility] requiring further screening, evaluation, or treatment in the ED at this time prior to discharge.  MDM Reviewed: nursing note and vitals     Nicoletta Dress. Colon Branch, MD 05/14/13 [phone]

## 2013-05-23 NOTE — ED Provider Notes (Signed)
Medical screening examination/treatment/procedure(s) were performed by non-physician practitioner and as supervising physician I was immediately available for consultation/collaboration.  Westyn Driggers S. Etha Stambaugh, MD 05/23/13 0441 

## 2013-05-30 ENCOUNTER — Other Ambulatory Visit (HOSPITAL_COMMUNITY): Payer: Self-pay | Admitting: Nurse Practitioner

## 2013-05-30 DIAGNOSIS — Z09 Encounter for follow-up examination after completed treatment for conditions other than malignant neoplasm: Secondary | ICD-10-CM

## 2013-06-01 ENCOUNTER — Emergency Department (HOSPITAL_COMMUNITY)
Admission: EM | Admit: 2013-06-01 | Discharge: 2013-06-01 | Disposition: A | Discharge status: Home / Self Care | Payer: Self-pay

## 2013-06-14 ENCOUNTER — Emergency Department (HOSPITAL_COMMUNITY)
Admission: EM | Admit: 2013-06-14 | Discharge: 2013-06-14 | Disposition: A | Discharge status: Home / Self Care | Payer: Self-pay | Attending: Emergency Medicine | Admitting: Emergency Medicine

## 2013-06-14 ENCOUNTER — Ambulatory Visit (HOSPITAL_COMMUNITY)
Admission: RE | Admit: 2013-06-14 | Discharge: 2013-06-14 | Disposition: A | Discharge status: Home / Self Care | Payer: Self-pay | Source: Ambulatory Visit | Attending: Nurse Practitioner | Admitting: Nurse Practitioner

## 2013-06-14 ENCOUNTER — Encounter (HOSPITAL_COMMUNITY): Payer: Self-pay | Admitting: Emergency Medicine

## 2013-06-14 DIAGNOSIS — F17200 Nicotine dependence, unspecified, uncomplicated: Secondary | ICD-10-CM | POA: Insufficient documentation

## 2013-06-14 DIAGNOSIS — J45909 Unspecified asthma, uncomplicated: Secondary | ICD-10-CM | POA: Insufficient documentation

## 2013-06-14 DIAGNOSIS — Z09 Encounter for follow-up examination after completed treatment for conditions other than malignant neoplasm: Secondary | ICD-10-CM

## 2013-06-14 DIAGNOSIS — I889 Nonspecific lymphadenitis, unspecified: Secondary | ICD-10-CM | POA: Insufficient documentation

## 2013-06-14 DIAGNOSIS — E119 Type 2 diabetes mellitus without complications: Secondary | ICD-10-CM | POA: Insufficient documentation

## 2013-06-14 DIAGNOSIS — Z79899 Other long term (current) drug therapy: Secondary | ICD-10-CM | POA: Insufficient documentation

## 2013-06-14 DIAGNOSIS — N83209 Unspecified ovarian cyst, unspecified side: Secondary | ICD-10-CM | POA: Insufficient documentation

## 2013-06-14 DIAGNOSIS — I10 Essential (primary) hypertension: Secondary | ICD-10-CM | POA: Insufficient documentation

## 2013-06-14 MED ORDER — CEPHALEXIN 500 MG PO CAPS: 500.0000 mg | ORAL_CAPSULE | Freq: Four times a day (QID) | ORAL | Status: DC

## 2013-06-14 MED ORDER — NAPROXEN 500 MG PO TABS: 500.0000 mg | ORAL_TABLET | Freq: Two times a day (BID) | ORAL | Status: DC

## 2013-06-14 MED ORDER — KETOROLAC TROMETHAMINE 60 MG/2ML IM SOLN
60.0000 mg | Freq: Once | INTRAMUSCULAR | Status: AC
  Administered 2013-06-14: 60 mg via INTRAMUSCULAR
  Filled 2013-06-14: qty 2

## 2013-06-14 NOTE — ED Provider Notes (Signed)
CSN: [id number]     Arrival date & time 06/14/13  1205 History  This chart was scribed for Vida Roller, MD by Bennett Scrape, ED Scribe. This patient was seen in room APA12/APA12 and the patient's care was started at 12:28 PM.   Chief Complaint  Patient presents with  . Facial Swelling    The history is provided by the patient. No language interpreter was used.    HPI Comments: Katrina Good is a 42 y.o. female who presents to the Emergency Department complaining of chin swelling that she noted upon waking this morning. She denies having symptoms prior to going to bed t last night. She reports associated sharp pain to the area with touch. She states that she has been taking several OTC medications to improve symptoms with no improvement. She denies any new contact with chemicals, new pets or partners and recent travels. Pt denies having prior episodes of similar symptoms. She was seen in the Radiology department today for a follow up US for ovarian cysts and was advised to come to the ED. She denies otalgia, sore throat, trouble swallowing, SOB, fever and sinu pressure. She has a h/o HTN, DM and asthma. The HTN is currently treated with lisinopril. She states that she was recently started on glipizide and Glucophage together for her DM but admits that she has taken both medications separately in the past with no complications.   [facility]  Past Medical History  Diagnosis Date  . Hypertension   . Diabetes mellitus   . Substance abuse     pt denies  . Suicidal thoughts     pt denies  . Asthma    Past Surgical History  Procedure Laterality Date  . Abdominal hysterectomy    . Cesarean section      x 2   Family History  Problem Relation Age of Onset  . Diabetes Other   . Heart disease    . Arthritis    . Lung disease    . Cancer     History  Substance Use Topics  . Smoking status: Current Every Day Smoker -- 0.50 packs/day for 25 years    Types:  Cigarettes  . Smokeless tobacco: Never Used  . Alcohol Use: No   OB History   Grav Para Term Preterm Abortions TAB SAB Ect Mult Living   5 2 2  3 1 2   2      Review of Systems  A complete 10 system review of systems was obtained and all systems are negative except as noted in the HPI and PMH.   Allergies  Codeine and Doxycycline  Home Medications   Current Outpatient Rx  Name  Route  Sig  Dispense  Refill  . albuterol (ACCUNEB) 0.63 MG/3ML nebulizer solution   Nebulization   Take 3 mLs (0.63 mg total) by nebulization every 6 (six) hours as needed for wheezing.   75 mL   0   . albuterol (PROVENTIL HFA;VENTOLIN HFA) 108 (90 BASE) MCG/ACT inhaler   Inhalation   Inhale 2 puffs into the lungs every 6 (six) hours as needed for wheezing.         Marland Kitchen glipiZIDE (GLUCOTROL XL) 2.5 MG 24 hr tablet   Oral   Take 2.5 mg by mouth daily.         Marland Kitchen lisinopril (PRINIVIL,ZESTRIL) 5 MG tablet   Oral   Take 5 mg by mouth daily.          Marland Kitchen  promethazine (PHENERGAN) 25 MG tablet   Oral   Take 25 mg by mouth every 6 (six) hours as needed for nausea.         . ranitidine (ZANTAC) 150 MG capsule   Oral   Take 1 capsule (150 mg total) by mouth 2 (two) times daily.   30 capsule   0   . cephALEXin (KEFLEX) 500 MG capsule   Oral   Take 1 capsule (500 mg total) by mouth 4 (four) times daily.   40 capsule   0   . naproxen (NAPROSYN) 500 MG tablet   Oral   Take 1 tablet (500 mg total) by mouth 2 (two) times daily with a meal.   30 tablet   0    Triage Vitals: BP 127/78  Pulse 86  Temp(Src) 98 F (36.7 C) (Oral)  Resp 20  Ht 5\' 9"  (1.753 m)  Wt 200 lb (90.719 kg)  BMI 29.52 kg/m2  SpO2 99%  Physical Exam  Nursing note and vitals reviewed. Constitutional: She is oriented to person, place, and time. She appears well-developed and well-nourished. No distress.  HENT:  Head: Atraumatic.  One singular submental lymph node that is palpable, rubbery and mobile, no trismus, no  dental tenderness, no tongue elevation, normal phonation.  Tympanic membranes are clear bilaterally, oropharynx is clear and moist, she has no tenderness underneath the tongue, no dental tenderness, no trismus or torticollis.  Eyes: Conjunctivae and EOM are normal.  Neck: Neck supple. No tracheal deviation present.  No torticollis   Cardiovascular: Normal rate and regular rhythm.   Pulmonary/Chest: Effort normal and breath sounds normal. No respiratory distress.  Musculoskeletal: Normal range of motion.  Neurological: She is alert and oriented to person, place, and time.  Skin: Skin is warm and dry.  Psychiatric: She has a normal mood and affect. Her behavior is normal.    ED Course   DIAGNOSTIC STUDIES: Oxygen Saturation is 99% on room air, normal by my interpretation.    COORDINATION OF CARE: 12:35 PM-Informed pt of likely infection causing symptoms. Discussed discharge plan which includes antibiotics and pain medication with pt and pt agreed to plan. Also advised pt to follow up as needed and pt agreed. Addressed symptoms to return for with pt.   Procedures (including critical care time)  Labs Reviewed - No data to display  1. Lymphadenitis     MDM  Overall the patient appears very benign, she has a sore lymph node in the submental area which is rubbery, mobile, moderately tender and nonerythematous. This does not appear to be angioedema. The patient is on an ACE inhibitor however she has no angioedema of her lips, tongue or posterior pharynx. She has normal phonation, no difficulty speaking or breathing. I will place her on Keflex, I have encouraged close followup. She has no evidence of infection in or around her head.  Meds given in ED:  Medications  ketorolac (TORADOL) injection 60 mg (not administered)    New Prescriptions   CEPHALEXIN (KEFLEX) 500 MG CAPSULE    Take 1 capsule (500 mg total) by mouth 4 (four) times daily.   NAPROXEN (NAPROSYN) 500 MG TABLET    Take 1  tablet (500 mg total) by mouth 2 (two) times daily with a meal.      I personally performed the services described in this documentation, which was scribed in my presence. The recorded information has been reviewed and is accurate.      Vida Roller, MD  06/14/13 1311 

## 2013-06-14 NOTE — ED Notes (Addendum)
Patient c/o swelling and pain under jaw and in neck. Denies any pain in throat but states "I have to hold my head a certain way to keep airway open. Feels like my tongue is swollen."  Patient able to handle oral secretions.

## 2013-10-14 ENCOUNTER — Encounter (HOSPITAL_COMMUNITY): Payer: Self-pay | Admitting: Emergency Medicine

## 2013-10-14 ENCOUNTER — Emergency Department (HOSPITAL_COMMUNITY)
Admission: EM | Admit: 2013-10-14 | Discharge: 2013-10-14 | Disposition: A | Discharge status: Home / Self Care | Payer: Self-pay | Attending: Emergency Medicine | Admitting: Emergency Medicine

## 2013-10-14 DIAGNOSIS — R110 Nausea: Secondary | ICD-10-CM | POA: Insufficient documentation

## 2013-10-14 DIAGNOSIS — E119 Type 2 diabetes mellitus without complications: Secondary | ICD-10-CM | POA: Insufficient documentation

## 2013-10-14 DIAGNOSIS — L02214 Cutaneous abscess of groin: Secondary | ICD-10-CM

## 2013-10-14 DIAGNOSIS — F17200 Nicotine dependence, unspecified, uncomplicated: Secondary | ICD-10-CM | POA: Insufficient documentation

## 2013-10-14 DIAGNOSIS — Z79899 Other long term (current) drug therapy: Secondary | ICD-10-CM | POA: Insufficient documentation

## 2013-10-14 DIAGNOSIS — J45909 Unspecified asthma, uncomplicated: Secondary | ICD-10-CM | POA: Insufficient documentation

## 2013-10-14 DIAGNOSIS — L02219 Cutaneous abscess of trunk, unspecified: Secondary | ICD-10-CM | POA: Insufficient documentation

## 2013-10-14 DIAGNOSIS — I10 Essential (primary) hypertension: Secondary | ICD-10-CM | POA: Insufficient documentation

## 2013-10-14 HISTORY — DX: Bronchitis, not specified as acute or chronic: J40

## 2013-10-14 LAB — GLUCOSE, CAPILLARY: Glucose-Capillary: 122 mg/dL — ABNORMAL HIGH (ref 70–99)

## 2013-10-14 MED ORDER — ONDANSETRON 8 MG PO TBDP
8.0000 mg | ORAL_TABLET | Freq: Once | ORAL | Status: AC
  Administered 2013-10-14: 8 mg via ORAL
  Filled 2013-10-14: qty 1

## 2013-10-14 MED ORDER — SULFAMETHOXAZOLE-TMP DS 800-160 MG PO TABS
1.0000 | ORAL_TABLET | Freq: Once | ORAL | Status: AC
  Administered 2013-10-14: 1 via ORAL
  Filled 2013-10-14: qty 1

## 2013-10-14 MED ORDER — SULFAMETHOXAZOLE-TRIMETHOPRIM 800-160 MG PO TABS: 1.0000 | ORAL_TABLET | Freq: Two times a day (BID) | ORAL | Status: DC

## 2013-10-14 MED ORDER — HYDROMORPHONE HCL PF 1 MG/ML IJ SOLN
1.0000 mg | Freq: Once | INTRAMUSCULAR | Status: AC
  Administered 2013-10-14: 1 mg via INTRAMUSCULAR
  Filled 2013-10-14: qty 1

## 2013-10-14 MED ORDER — LIDOCAINE HCL (PF) 2 % IJ SOLN
10.0000 mL | Freq: Once | INTRAMUSCULAR | Status: DC
  Filled 2013-10-14: qty 10

## 2013-10-14 MED ORDER — OXYCODONE-ACETAMINOPHEN 5-325 MG PO TABS: 1.0000 | ORAL_TABLET | ORAL | Status: DC | PRN

## 2013-10-14 NOTE — ED Notes (Signed)
Patient reports x2 abscess in genital region x2 weeks that are progressively getting worse. Denies any fevers or drainage. Patient does report nausea.

## 2013-10-14 NOTE — ED Notes (Signed)
Pt given detailed discharge instructions. Verbalized understanding of all 2 scripts given. Sterile dressing applied.

## 2013-10-16 ENCOUNTER — Encounter (HOSPITAL_COMMUNITY): Payer: Self-pay | Admitting: Emergency Medicine

## 2013-10-16 ENCOUNTER — Emergency Department (HOSPITAL_COMMUNITY)
Admission: EM | Admit: 2013-10-16 | Discharge: 2013-10-16 | Disposition: A | Discharge status: Home / Self Care | Payer: Self-pay | Attending: Emergency Medicine | Admitting: Emergency Medicine

## 2013-10-16 DIAGNOSIS — F17200 Nicotine dependence, unspecified, uncomplicated: Secondary | ICD-10-CM | POA: Insufficient documentation

## 2013-10-16 DIAGNOSIS — Z79899 Other long term (current) drug therapy: Secondary | ICD-10-CM | POA: Insufficient documentation

## 2013-10-16 DIAGNOSIS — Z4801 Encounter for change or removal of surgical wound dressing: Secondary | ICD-10-CM | POA: Insufficient documentation

## 2013-10-16 DIAGNOSIS — Z792 Long term (current) use of antibiotics: Secondary | ICD-10-CM | POA: Insufficient documentation

## 2013-10-16 DIAGNOSIS — J45909 Unspecified asthma, uncomplicated: Secondary | ICD-10-CM | POA: Insufficient documentation

## 2013-10-16 DIAGNOSIS — I10 Essential (primary) hypertension: Secondary | ICD-10-CM | POA: Insufficient documentation

## 2013-10-16 DIAGNOSIS — L0291 Cutaneous abscess, unspecified: Secondary | ICD-10-CM

## 2013-10-16 DIAGNOSIS — E119 Type 2 diabetes mellitus without complications: Secondary | ICD-10-CM | POA: Insufficient documentation

## 2013-10-16 MED ORDER — HYDROCODONE-ACETAMINOPHEN 5-325 MG PO TABS: 1.0000 | ORAL_TABLET | Freq: Four times a day (QID) | ORAL | Status: DC | PRN

## 2013-10-16 NOTE — ED Provider Notes (Signed)
CSN: [id number]     Arrival date & time 10/16/13  1530 History   First MD Initiated Contact with Patient 10/16/13 1540     Chief Complaint  Patient presents with  . Abscess   (Consider location/radiation/quality/duration/timing/severity/associated sxs/prior Treatment) HPI Comments: Patient presents today requesting to have an abscess of her right groin rechecked and packing removed.  Abscess was incised and drained in the ED two days ago.  Patient is currently taking a 14 day course of Bactrim DS.  She is also taking Percocet for the pain, but reports that she feels that the Percocet is too strong.  She reports that the area is very tender.  She reports that she has not noticed any increased erythema, swelling, or warmth around the area.  She has not noticed any drainage.  No fever or chills.  No nausea or vomiting.  The patient is diabetic and does have a history of Abscesses.  The history is provided by the patient.    Past Medical History  Diagnosis Date  . Hypertension   . Diabetes mellitus   . Substance abuse     pt denies  . Suicidal thoughts     pt denies  . Asthma   . Bronchitis    Past Surgical History  Procedure Laterality Date  . Abdominal hysterectomy    . Cesarean section      x 2   Family History  Problem Relation Age of Onset  . Diabetes Other   . Heart disease    . Arthritis    . Lung disease    . Cancer     History  Substance Use Topics  . Smoking status: Current Every Day Smoker -- 0.50 packs/day for 25 years    Types: Cigarettes  . Smokeless tobacco: Never Used  . Alcohol Use: No   OB History   Grav Para Term Preterm Abortions TAB SAB Ect Mult Living   5 2 2  3 1 2   2      Review of Systems  All other systems reviewed and are negative.    Allergies  Doxycycline  Home Medications   Current Outpatient Rx  Name  Route  Sig  Dispense  Refill  . albuterol (ACCUNEB) 0.63 MG/3ML nebulizer solution   Nebulization   Take 3 mLs (0.63 mg total)  by nebulization every 6 (six) hours as needed for wheezing.   75 mL   0   . albuterol (PROVENTIL HFA;VENTOLIN HFA) 108 (90 BASE) MCG/ACT inhaler   Inhalation   Inhale 2 puffs into the lungs every 6 (six) hours as needed for wheezing.         Marland Kitchen glipiZIDE (GLUCOTROL XL) 2.5 MG 24 hr tablet   Oral   Take 2.5 mg by mouth daily.         Marland Kitchen lisinopril (PRINIVIL,ZESTRIL) 5 MG tablet   Oral   Take 5 mg by mouth daily.          . ranitidine (ZANTAC) 150 MG capsule   Oral   Take 1 capsule (150 mg total) by mouth 2 (two) times daily.   30 capsule   0   . oxyCODONE-acetaminophen (PERCOCET/ROXICET) 5-325 MG per tablet   Oral   Take 1 tablet by mouth every 4 (four) hours as needed for severe pain.   20 tablet   0   . sulfamethoxazole-trimethoprim (SEPTRA DS) 800-160 MG per tablet   Oral   Take 1 tablet by mouth 2 (two) times daily.  28 tablet   0    BP 125/81  Pulse 94  Temp(Src) 98.7 F (37.1 C) (Oral)  Resp 12  SpO2 100% Physical Exam  Nursing note and vitals reviewed. Constitutional: She appears well-developed and well-nourished.  HENT:  Head: Normocephalic and atraumatic.  Mouth/Throat: Oropharynx is clear and moist.  Cardiovascular: Normal rate, regular rhythm and normal heart sounds.   Pulmonary/Chest: Effort normal and breath sounds normal.  Genitourinary:     Neurological: She is alert.  Skin: Skin is warm and dry.  Psychiatric: She has a normal mood and affect.    ED Course  Procedures (including critical care time) Labs Review Labs Reviewed - No data to display Imaging Review No results found.  EKG Interpretation   None       MDM  No diagnosis found. Patient presenting to the ED to have an abscess rechecked and packing removed.  Packing removed today in the ED.  No signs of surrounding Cellulitis at this time.  Patient is afebrile.  Patient is currently on Bactrim DS.  Patient instructed to continue taking antibiotic.  Patient stable for  discharge.  Return precautions given.    Santiago Glad, PA-C 10/16/13 1650

## 2013-10-16 NOTE — ED Notes (Signed)
Pt has already been seen by PA and packing removed. Says she is waiting for her paper work only

## 2013-10-16 NOTE — ED Notes (Signed)
Pt here for right labia wound check and packing removal.

## 2013-10-17 NOTE — ED Provider Notes (Signed)
CSN: [id number]     Arrival date & time 10/14/13  1305 History   First MD Initiated Contact with Patient 10/14/13 1514     Chief Complaint  Patient presents with  . Abscess   (Consider location/radiation/quality/duration/timing/severity/associated sxs/prior Treatment) Patient is a 42 y.o. female presenting with abscess. The history is provided by the patient.  Abscess Location:  Ano-genital Ano-genital abscess location:  Groin Abscess quality: fluctuance, induration, painful, redness and warmth   Abscess quality: not weeping   Red streaking: no   Duration:  2 weeks Progression:  Worsening Pain details:    Quality:  Sharp   Severity:  Moderate   Timing:  Constant   Progression:  Worsening Chronicity:  Recurrent Context: diabetes   Relieved by:  Nothing Worsened by:  Nothing tried Ineffective treatments:  Warm compresses Associated symptoms: nausea   Associated symptoms: no fatigue, no fever and no vomiting   Associated symptoms comment:  Gets nauseated when pain severe.   Past Medical History  Diagnosis Date  . Hypertension   . Diabetes mellitus   . Substance abuse     pt denies  . Suicidal thoughts     pt denies  . Asthma   . Bronchitis    Past Surgical History  Procedure Laterality Date  . Abdominal hysterectomy    . Cesarean section      x 2   Family History  Problem Relation Age of Onset  . Diabetes Other   . Heart disease    . Arthritis    . Lung disease    . Cancer     History  Substance Use Topics  . Smoking status: Current Every Day Smoker -- 0.50 packs/day for 25 years    Types: Cigarettes  . Smokeless tobacco: Never Used  . Alcohol Use: No   OB History   Grav Para Term Preterm Abortions TAB SAB Ect Mult Living   5 2 2  3 1 2   2      Review of Systems  Constitutional: Negative for fever, chills and fatigue.  Respiratory: Negative for shortness of breath and wheezing.   Gastrointestinal: Positive for nausea. Negative for vomiting.   Skin: Positive for wound. Negative for color change.  Neurological: Negative for numbness.    Allergies  Doxycycline  Home Medications   Current Outpatient Rx  Name  Route  Sig  Dispense  Refill  . albuterol (ACCUNEB) 0.63 MG/3ML nebulizer solution   Nebulization   Take 3 mLs (0.63 mg total) by nebulization every 6 (six) hours as needed for wheezing.   75 mL   0   . albuterol (PROVENTIL HFA;VENTOLIN HFA) 108 (90 BASE) MCG/ACT inhaler   Inhalation   Inhale 2 puffs into the lungs every 6 (six) hours as needed for wheezing.         Marland Kitchen glipiZIDE (GLUCOTROL XL) 2.5 MG 24 hr tablet   Oral   Take 2.5 mg by mouth daily.         Marland Kitchen lisinopril (PRINIVIL,ZESTRIL) 5 MG tablet   Oral   Take 5 mg by mouth daily.          . ranitidine (ZANTAC) 150 MG capsule   Oral   Take 1 capsule (150 mg total) by mouth 2 (two) times daily.   30 capsule   0   . HYDROcodone-acetaminophen (NORCO/VICODIN) 5-325 MG per tablet   Oral   Take 1-2 tablets by mouth every 6 (six) hours as needed.   12 tablet  0   . oxyCODONE-acetaminophen (PERCOCET/ROXICET) 5-325 MG per tablet   Oral   Take 1 tablet by mouth every 4 (four) hours as needed for severe pain.   20 tablet   0   . sulfamethoxazole-trimethoprim (SEPTRA DS) 800-160 MG per tablet   Oral   Take 1 tablet by mouth 2 (two) times daily.   28 tablet   0    BP 104/67  Pulse 107  Temp(Src) 98.3 F (36.8 C) (Oral)  Resp 18  Ht 5\' 9"  (1.753 m)  Wt 207 lb (93.895 kg)  BMI 30.55 kg/m2  SpO2 97% Physical Exam  Constitutional: She is oriented to person, place, and time. She appears well-developed and well-nourished.  HENT:  Head: Normocephalic.  Cardiovascular: Normal rate.   Pulmonary/Chest: Effort normal.  Musculoskeletal: She exhibits no tenderness.  Neurological: She is alert and oriented to person, place, and time. No sensory deficit.  Skin: Skin is warm and dry. No rash noted.  Oblong raised abscess right groin crease, 4 cm in  length,  Tender, fluctuant with indurated edges. No red streaking,  Slight surrounding erythema.  Right inguinal adenopathy.    ED Course  Procedures (including critical care time)  INCISION AND DRAINAGE Performed by: Burgess Amor Consent: Verbal consent obtained. Risks and benefits: risks, benefits and alternatives were discussed Type: abscess  Body area: right groin  Anesthesia: local infiltration  Incision was made with a scalpel.  Local anesthetic: lidocaine 2% without epinephrine  Anesthetic total: 5 ml  Complexity: complex Blunt dissection to break up loculations  Drainage: purulent  Drainage amount: moderate  Packing material: 1/4 in iodoform gauze  Patient tolerance: Patient tolerated the procedure well with no immediate complications.    Labs Review Labs Reviewed  GLUCOSE, CAPILLARY - Abnormal; Notable for the following:    Glucose-Capillary 122 (*)    All other components within normal limits   Imaging Review No results found.  EKG Interpretation   None       MDM   1. Abscess of groin, right    Small amount of packing 1/4 inch placed,  Advised to start warm soaks,  Can remove packing in 2 days or return here for this if any problems or concerns, increased pain, swelling, continued drainage.  Pt understands plan.  Bactrim,  Oxycodone prescribed.  Patients labs and/or radiological studies were viewed and considered during the medical decision making and disposition process.     Burgess Amor, PA-C 10/17/13 1139

## 2013-10-17 NOTE — ED Provider Notes (Signed)
  Medical screening examination/treatment/procedure(s) were performed by non-physician practitioner and as supervising physician I was immediately available for consultation/collaboration.  EKG Interpretation   None          Gerhard Munch, MD 10/17/13 [phone]

## 2013-10-17 NOTE — ED Provider Notes (Signed)
Medical screening examination/treatment/procedure(s) were performed by non-physician practitioner and as supervising physician I was immediately available for consultation/collaboration.  EKG Interpretation   None         Bitania Shankland L Daniella Dewberry, MD 10/17/13 2315 

## 2014-02-27 ENCOUNTER — Emergency Department (HOSPITAL_COMMUNITY)
Admission: EM | Admit: 2014-02-27 | Discharge: 2014-02-27 | Disposition: A | Discharge status: Home / Self Care | Payer: Self-pay | Attending: Emergency Medicine | Admitting: Emergency Medicine

## 2014-02-27 ENCOUNTER — Encounter (HOSPITAL_COMMUNITY): Payer: Self-pay | Admitting: Emergency Medicine

## 2014-02-27 DIAGNOSIS — Z79899 Other long term (current) drug therapy: Secondary | ICD-10-CM | POA: Insufficient documentation

## 2014-02-27 DIAGNOSIS — I10 Essential (primary) hypertension: Secondary | ICD-10-CM | POA: Insufficient documentation

## 2014-02-27 DIAGNOSIS — J45909 Unspecified asthma, uncomplicated: Secondary | ICD-10-CM | POA: Insufficient documentation

## 2014-02-27 DIAGNOSIS — L0291 Cutaneous abscess, unspecified: Secondary | ICD-10-CM

## 2014-02-27 DIAGNOSIS — N61 Mastitis without abscess: Secondary | ICD-10-CM | POA: Insufficient documentation

## 2014-02-27 DIAGNOSIS — M545 Low back pain, unspecified: Secondary | ICD-10-CM | POA: Insufficient documentation

## 2014-02-27 DIAGNOSIS — M79609 Pain in unspecified limb: Secondary | ICD-10-CM | POA: Insufficient documentation

## 2014-02-27 DIAGNOSIS — K219 Gastro-esophageal reflux disease without esophagitis: Secondary | ICD-10-CM | POA: Insufficient documentation

## 2014-02-27 DIAGNOSIS — F17200 Nicotine dependence, unspecified, uncomplicated: Secondary | ICD-10-CM | POA: Insufficient documentation

## 2014-02-27 DIAGNOSIS — E119 Type 2 diabetes mellitus without complications: Secondary | ICD-10-CM | POA: Insufficient documentation

## 2014-02-27 DIAGNOSIS — M549 Dorsalgia, unspecified: Secondary | ICD-10-CM

## 2014-02-27 HISTORY — DX: Gastro-esophageal reflux disease without esophagitis: K21.9

## 2014-02-27 LAB — URINALYSIS, ROUTINE W REFLEX MICROSCOPIC
Bilirubin Urine: NEGATIVE
Glucose, UA: NEGATIVE mg/dL
Hgb urine dipstick: NEGATIVE
Ketones, ur: NEGATIVE mg/dL
Leukocytes, UA: NEGATIVE
Nitrite: NEGATIVE
Protein, ur: NEGATIVE mg/dL
Urobilinogen, UA: 0.2 mg/dL (ref 0.0–1.0)
pH: 6 (ref 5.0–8.0)

## 2014-02-27 LAB — CBG MONITORING, ED: GLUCOSE-CAPILLARY: 93 mg/dL (ref 70–99)

## 2014-02-27 MED ORDER — CYCLOBENZAPRINE HCL 10 MG PO TABS
10.0000 mg | ORAL_TABLET | Freq: Once | ORAL | Status: AC
  Administered 2014-02-27: 10 mg via ORAL
  Filled 2014-02-27: qty 1

## 2014-02-27 MED ORDER — HYDROCODONE-ACETAMINOPHEN 5-325 MG PO TABS: ORAL_TABLET | ORAL | Status: DC

## 2014-02-27 MED ORDER — HYDROCODONE-ACETAMINOPHEN 5-325 MG PO TABS
1.0000 | ORAL_TABLET | Freq: Once | ORAL | Status: AC
  Administered 2014-02-27: 1 via ORAL
  Filled 2014-02-27: qty 1

## 2014-02-27 MED ORDER — SULFAMETHOXAZOLE-TRIMETHOPRIM 800-160 MG PO TABS: 1.0000 | ORAL_TABLET | Freq: Two times a day (BID) | ORAL | Status: DC

## 2014-02-27 MED ORDER — CEPHALEXIN 500 MG PO CAPS: 500.0000 mg | ORAL_CAPSULE | Freq: Four times a day (QID) | ORAL | Status: DC

## 2014-02-27 NOTE — ED Notes (Signed)
Lower back pain x 2 days, denies injury.  Pt also c/o knot/pain to right breast x 2 days.

## 2014-02-27 NOTE — ED Notes (Signed)
nad noted prior to dc. Dc instructions reviewed and explained. Voiced understanding. 3 rx given and ambulated out without difficulty.

## 2014-02-27 NOTE — Discharge Instructions (Signed)

## 2014-02-27 NOTE — ED Provider Notes (Signed)
CSN: [id number]     Arrival date & time 02/27/14  [id number] History   First MD Initiated Contact with Patient 02/27/14 [phone]     Chief Complaint  Patient presents with  . Back Pain  . Breast Pain     (Consider location/radiation/quality/duration/timing/severity/associated sxs/prior Treatment) Patient is a 43 y.o. female presenting with back pain. The history is provided by the patient.  Back Pain Location:  Lumbar spine Quality:  Aching Radiates to:  L posterior upper leg and R posterior upper leg Pain severity:  Moderate Pain is:  Same all the time Onset quality:  Gradual Duration:  2 days Timing:  Constant Progression:  Unchanged Chronicity:  New Context: twisting   Context: not falling   Relieved by:  Nothing Worsened by:  Bending, standing, twisting and sitting Ineffective treatments:  None tried Associated symptoms: leg pain   Associated symptoms: no abdominal pain, no abdominal swelling, no bladder incontinence, no bowel incontinence, no chest pain, no dysuria, no fever, no headaches, no numbness, no paresthesias, no pelvic pain, no perianal numbness, no tingling and no weakness     paitent also c/o localized redness and "boil" to her lower right breast for several days.  Has hx of same.  She denies drainage, fever, red streaks, vomiting or chills.     Past Medical History  Diagnosis Date  . Hypertension   . Diabetes mellitus   . Substance abuse     pt denies  . Suicidal thoughts     pt denies  . Asthma   . Bronchitis   . Acid reflux    Past Surgical History  Procedure Laterality Date  . Abdominal hysterectomy    . Cesarean section      x 2   Family History  Problem Relation Age of Onset  . Diabetes Other   . Heart disease    . Arthritis    . Lung disease    . Cancer     History  Substance Use Topics  . Smoking status: Current Every Day Smoker -- 0.50 packs/day for 25 years    Types: Cigarettes  . Smokeless tobacco: Never Used  . Alcohol Use: No    OB History   Grav Para Term Preterm Abortions TAB SAB Ect Mult Living   5 2 2  3 1 2   2      Review of Systems  Constitutional: Negative for fever.  Respiratory: Negative for shortness of breath.   Cardiovascular: Negative for chest pain.  Gastrointestinal: Negative for vomiting, abdominal pain, constipation and bowel incontinence.  Genitourinary: Negative for bladder incontinence, dysuria, hematuria, flank pain, decreased urine volume, difficulty urinating and pelvic pain.       No perineal numbness or incontinence of urine or feces  Musculoskeletal: Positive for back pain. Negative for joint swelling.  Skin: Negative for rash.       Abscess right lower breast  Neurological: Negative for tingling, weakness, numbness, headaches and paresthesias.  All other systems reviewed and are negative.     Allergies  Doxycycline  Home Medications   Prior to Admission medications   Medication Sig Start Date End Date Taking? Authorizing Provider  albuterol (ACCUNEB) 0.63 MG/3ML nebulizer solution Take 3 mLs (0.63 mg total) by nebulization every 6 (six) hours as needed for wheezing. 02/25/13  Yes Muskan Bolla L. Kameran Lallier, PA-C  albuterol (PROVENTIL HFA;VENTOLIN HFA) 108 (90 BASE) MCG/ACT inhaler Inhale 2 puffs into the lungs every 6 (six) hours as needed for wheezing.   Yes Historical  Provider, MD  ibuprofen (ADVIL,MOTRIN) 200 MG tablet Take 200 mg by mouth daily as needed for moderate pain.   Yes Historical Provider, MD  lisinopril (PRINIVIL,ZESTRIL) 5 MG tablet Take 5 mg by mouth daily.    Yes Historical Provider, MD  ranitidine (ZANTAC) 150 MG capsule Take 1 capsule (150 mg total) by mouth 2 (two) times daily. 01/28/13  Yes Benny LennertJoseph L Zammit, MD  glipiZIDE (GLUCOTROL XL) 2.5 MG 24 hr tablet Take 2.5 mg by mouth daily.    Historical Provider, MD   BP 143/86  Pulse 87  Temp(Src) 98.4 F (36.9 C) (Oral)  Resp 16  Ht 5\' 9"  (1.753 m)  Wt 212 lb (96.163 kg)  BMI 31.29 kg/m2  SpO2 100% Physical  Exam  Nursing note and vitals reviewed. Constitutional: She is oriented to person, place, and time. She appears well-developed and well-nourished. No distress.  HENT:  Head: Normocephalic and atraumatic.  Neck: Normal range of motion. Neck supple.  Cardiovascular: Normal rate, regular rhythm, normal heart sounds and intact distal pulses.   No murmur heard. Pulmonary/Chest: Effort normal and breath sounds normal. No respiratory distress.  Abdominal: Soft. She exhibits no distension. There is no tenderness.  Musculoskeletal: She exhibits tenderness. She exhibits no edema.       Lumbar back: She exhibits tenderness and pain. She exhibits normal range of motion, no swelling, no deformity, no laceration and normal pulse.  ttp of the lumbar paraspinal muscles.  No spinal tenderness.  DP pulses are brisk and symmetrical.  Distal sensation intact.  Hip Flexors/Extensors are intact.  Pt has normal strength against resistance of bilateral lower extremities.     Neurological: She is alert and oriented to person, place, and time. She has normal strength. No sensory deficit. She exhibits normal muscle tone. Coordination and gait normal.  Reflex Scores:      Patellar reflexes are 2+ on the right side and 2+ on the left side.      Achilles reflexes are 2+ on the right side and 2+ on the left side. Skin: Skin is warm and dry. No rash noted.  Dime sized abscess with mild surrounding erythema to the skin fold of the right breast.  Abscess is mildly indurated without fluctuance.  No red streaks.  Remaining breast is NT.      ED Course  Procedures (including critical care time) Labs Review Labs Reviewed  URINALYSIS, ROUTINE W REFLEX MICROSCOPIC - Abnormal; Notable for the following:    Specific Gravity, Urine >1.030 (*)    All other components within normal limits  CBG MONITORING, ED    Imaging Review No results found.   EKG Interpretation None      MDM   Final diagnoses:  Abscess     Patient with small abscess to lower right breast. Slight surrounding erythema is present. No fluctuance. Recommended incision and drainage, but patient declines at this time.  Will treat with Bactrim and Keflex, patient agrees to return in 1-2 days if her symptoms are worsening for likely incision and drainage.  VSS. She is well appearing, no concerning sx's for emergent neurological process, back pain likely related to musculoskeletal pain.  Pt appears stable for d/c.     Johnavon Mcclafferty L. Trisha Mangleriplett, PA-C 02/28/14 2150

## 2014-03-01 NOTE — ED Provider Notes (Signed)
Medical screening examination/treatment/procedure(s) were performed by non-physician practitioner and as supervising physician I was immediately available for consultation/collaboration.   EKG Interpretation None       Shelda JakesScott W. Lesha Jager, MD 03/01/14 [phone]

## 2014-06-04 ENCOUNTER — Encounter (HOSPITAL_COMMUNITY): Payer: Self-pay | Admitting: Emergency Medicine

## 2014-06-04 ENCOUNTER — Emergency Department (HOSPITAL_COMMUNITY)
Admission: EM | Admit: 2014-06-04 | Discharge: 2014-06-04 | Disposition: A | Discharge status: Home / Self Care | Payer: Self-pay | Attending: Emergency Medicine | Admitting: Emergency Medicine

## 2014-06-04 DIAGNOSIS — K029 Dental caries, unspecified: Secondary | ICD-10-CM | POA: Insufficient documentation

## 2014-06-04 DIAGNOSIS — K047 Periapical abscess without sinus: Secondary | ICD-10-CM | POA: Insufficient documentation

## 2014-06-04 DIAGNOSIS — K0889 Other specified disorders of teeth and supporting structures: Secondary | ICD-10-CM

## 2014-06-04 DIAGNOSIS — J45909 Unspecified asthma, uncomplicated: Secondary | ICD-10-CM | POA: Insufficient documentation

## 2014-06-04 DIAGNOSIS — E119 Type 2 diabetes mellitus without complications: Secondary | ICD-10-CM | POA: Insufficient documentation

## 2014-06-04 DIAGNOSIS — K219 Gastro-esophageal reflux disease without esophagitis: Secondary | ICD-10-CM | POA: Insufficient documentation

## 2014-06-04 DIAGNOSIS — I10 Essential (primary) hypertension: Secondary | ICD-10-CM | POA: Insufficient documentation

## 2014-06-04 DIAGNOSIS — L0889 Other specified local infections of the skin and subcutaneous tissue: Secondary | ICD-10-CM | POA: Insufficient documentation

## 2014-06-04 DIAGNOSIS — F17200 Nicotine dependence, unspecified, uncomplicated: Secondary | ICD-10-CM | POA: Insufficient documentation

## 2014-06-04 DIAGNOSIS — K089 Disorder of teeth and supporting structures, unspecified: Secondary | ICD-10-CM | POA: Insufficient documentation

## 2014-06-04 DIAGNOSIS — L089 Local infection of the skin and subcutaneous tissue, unspecified: Secondary | ICD-10-CM

## 2014-06-04 DIAGNOSIS — Z79899 Other long term (current) drug therapy: Secondary | ICD-10-CM | POA: Insufficient documentation

## 2014-06-04 MED ORDER — AMOXICILLIN 500 MG PO CAPS: 500.0000 mg | ORAL_CAPSULE | Freq: Three times a day (TID) | ORAL | Status: DC

## 2014-06-04 MED ORDER — HYDROCODONE-ACETAMINOPHEN 5-325 MG PO TABS: ORAL_TABLET | ORAL | Status: DC

## 2014-06-04 NOTE — ED Provider Notes (Signed)
Medical screening examination/treatment/procedure(s) were performed by non-physician practitioner and as supervising physician I was immediately available for consultation/collaboration.   EKG Interpretation None        Aaryn Sermon L Azadeh Hyder, MD 06/04/14 1304 

## 2014-06-04 NOTE — Discharge Instructions (Signed)

## 2014-06-04 NOTE — ED Notes (Addendum)
C/o abscessed tooth and feels like she has a knot in left side of neck.  Also wants doctor to look at ears.

## 2014-06-04 NOTE — ED Provider Notes (Signed)
CSN: [id number]     Arrival date & time 06/04/14  [id number] History   First MD Initiated Contact with Patient 06/04/14 [phone]     Chief Complaint  Patient presents with  . Abscess   Katrina Good is a 43 y.o. female who presents to the Emergency Department complaining of dental pain for 4 days.  She reports increased pain this morning and swelling to her jaw.  She states the pain radiates to her ears.  She has not been able to see a dentist "in a long time" because she does not have the funds to pay for the visit.  She also c/o a painful red "bump" to her left neck for 2 days.  She denies fever, chills, difficulty swallowing or breathing, facial swelling or redness.    (Consider location/radiation/quality/duration/timing/severity/associated sxs/prior Treatment) HPI  Past Medical History  Diagnosis Date  . Hypertension   . Diabetes mellitus   . Substance abuse     pt denies  . Suicidal thoughts     pt denies  . Asthma   . Bronchitis   . Acid reflux    Past Surgical History  Procedure Laterality Date  . Abdominal hysterectomy    . Cesarean section      x 2   Family History  Problem Relation Age of Onset  . Diabetes Other   . Heart disease    . Arthritis    . Lung disease    . Cancer     History  Substance Use Topics  . Smoking status: Current Every Day Smoker -- 0.50 packs/day for 25 years    Types: Cigarettes  . Smokeless tobacco: Never Used  . Alcohol Use: No   OB History   Grav Para Term Preterm Abortions TAB SAB Ect Mult Living   5 2 2  3 1 2   2      Review of Systems  Constitutional: Negative for fever and appetite change.  HENT: Positive for dental problem. Negative for congestion, facial swelling, sore throat, trouble swallowing and voice change.   Eyes: Negative for pain and visual disturbance.  Respiratory: Negative for shortness of breath.   Musculoskeletal: Negative for neck pain and neck stiffness.  Skin: Negative for wound.       "bump" to left neck"    Neurological: Negative for dizziness, facial asymmetry, speech difficulty and headaches.  Hematological: Negative for adenopathy.  All other systems reviewed and are negative.     Allergies  Doxycycline  Home Medications   Prior to Admission medications   Medication Sig Start Date End Date Taking? Authorizing Provider  albuterol (ACCUNEB) 0.63 MG/3ML nebulizer solution Take 3 mLs (0.63 mg total) by nebulization every 6 (six) hours as needed for wheezing. 02/25/13   Jadwiga Faidley L. Imanuel Pruiett, PA-C  albuterol (PROVENTIL HFA;VENTOLIN HFA) 108 (90 BASE) MCG/ACT inhaler Inhale 2 puffs into the lungs every 6 (six) hours as needed for wheezing.    Historical Provider, MD  amoxicillin (AMOXIL) 500 MG capsule Take 1 capsule (500 mg total) by mouth 3 (three) times daily. For 10 days 06/04/14   Jaira Canady L. Tomoya Ringwald, PA-C  cephALEXin (KEFLEX) 500 MG capsule Take 1 capsule (500 mg total) by mouth 4 (four) times daily. For 10 days 02/27/14   Lakeidra Reliford L. Tea Collums, PA-C  glipiZIDE (GLUCOTROL XL) 2.5 MG 24 hr tablet Take 2.5 mg by mouth daily.    Historical Provider, MD  HYDROcodone-acetaminophen (NORCO/VICODIN) 5-325 MG per tablet Take one-two tabs po q 4-6 hrs prn pain  02/27/14   Mariaeduarda Defranco L. Concha Sudol, PA-C  HYDROcodone-acetaminophen (NORCO/VICODIN) 5-325 MG per tablet Take one-two tabs po q 4-6 hrs prn pain 06/04/14   Mikiah Durall L. Anddy Wingert, PA-C  ibuprofen (ADVIL,MOTRIN) 200 MG tablet Take 200 mg by mouth daily as needed for moderate pain.    Historical Provider, MD  lisinopril (PRINIVIL,ZESTRIL) 5 MG tablet Take 5 mg by mouth daily.     Historical Provider, MD  ranitidine (ZANTAC) 150 MG capsule Take 1 capsule (150 mg total) by mouth 2 (two) times daily. 01/28/13   Benny Lennert, MD  sulfamethoxazole-trimethoprim (SEPTRA DS) 800-160 MG per tablet Take 1 tablet by mouth 2 (two) times daily. For 10 days 02/27/14   Jataya Wann L. Temari Schooler, PA-C   BP 124/93  Pulse 89  Temp(Src) 98.1 F (36.7 C) (Oral)  Resp 18  Ht 5\' 9"  (1.753 m)  Wt  212 lb (96.163 kg)  BMI 31.29 kg/m2  SpO2 100% Physical Exam  Nursing note and vitals reviewed. Constitutional: She is oriented to person, place, and time. She appears well-developed and well-nourished. No distress.  HENT:  Head: Normocephalic and atraumatic.  Right Ear: Tympanic membrane and ear canal normal.  Left Ear: Tympanic membrane and ear canal normal.  Mouth/Throat: Uvula is midline, oropharynx is clear and moist and mucous membranes are normal. No trismus in the jaw. Dental caries present. No dental abscesses or uvula swelling.    ttp and dental caries of #17 and 31. The #17 tooth appears impacted and discolored.   No facial swelling, obvious dental abscess, trismus, or sublingual abnml.    Neck: Normal range of motion. Neck supple.  Cardiovascular: Normal rate, regular rhythm, normal heart sounds and intact distal pulses.   No murmur heard. Pulmonary/Chest: Effort normal and breath sounds normal. No respiratory distress.  Musculoskeletal: Normal range of motion.  Lymphadenopathy:    She has no cervical adenopathy.  Neurological: She is alert and oriented to person, place, and time. She exhibits normal muscle tone. Coordination normal.  Skin: Skin is warm and dry.  1 cm erythematous pustule to left neck.  No surrounding erythema, fluctuance or induration     ED Course  Procedures (including critical care time) Labs Review Labs Reviewed - No data to display  Imaging Review No results found.   EKG Interpretation None      MDM   Final diagnoses:  Pain, dental  Skin pustule    Pt is well appearing. C/o dental pain with multiple dental caries.  No concerning sx's for infection to floor of the mouth or deep structures of the neck.  Given referral info for a dentist.  VSS.  She appears stable for d/c    Jazara Swiney L. Trisha Mangle, PA-C 06/04/14 [phone]

## 2014-09-02 ENCOUNTER — Encounter (HOSPITAL_COMMUNITY): Payer: Self-pay | Admitting: Emergency Medicine

## 2014-09-08 ENCOUNTER — Encounter (HOSPITAL_COMMUNITY): Payer: Self-pay | Admitting: *Deleted

## 2014-09-08 ENCOUNTER — Emergency Department (HOSPITAL_COMMUNITY)
Admission: EM | Admit: 2014-09-08 | Discharge: 2014-09-08 | Disposition: A | Discharge status: Home / Self Care | Payer: Self-pay | Attending: Emergency Medicine | Admitting: Emergency Medicine

## 2014-09-08 DIAGNOSIS — Y998 Other external cause status: Secondary | ICD-10-CM | POA: Insufficient documentation

## 2014-09-08 DIAGNOSIS — T7840XA Allergy, unspecified, initial encounter: Secondary | ICD-10-CM

## 2014-09-08 DIAGNOSIS — Z79899 Other long term (current) drug therapy: Secondary | ICD-10-CM | POA: Insufficient documentation

## 2014-09-08 DIAGNOSIS — J45909 Unspecified asthma, uncomplicated: Secondary | ICD-10-CM | POA: Insufficient documentation

## 2014-09-08 DIAGNOSIS — Z720 Tobacco use: Secondary | ICD-10-CM | POA: Insufficient documentation

## 2014-09-08 DIAGNOSIS — Y9389 Activity, other specified: Secondary | ICD-10-CM | POA: Insufficient documentation

## 2014-09-08 DIAGNOSIS — X58XXXA Exposure to other specified factors, initial encounter: Secondary | ICD-10-CM | POA: Insufficient documentation

## 2014-09-08 DIAGNOSIS — L500 Allergic urticaria: Secondary | ICD-10-CM | POA: Insufficient documentation

## 2014-09-08 DIAGNOSIS — I10 Essential (primary) hypertension: Secondary | ICD-10-CM | POA: Insufficient documentation

## 2014-09-08 DIAGNOSIS — E119 Type 2 diabetes mellitus without complications: Secondary | ICD-10-CM | POA: Insufficient documentation

## 2014-09-08 DIAGNOSIS — Y9289 Other specified places as the place of occurrence of the external cause: Secondary | ICD-10-CM | POA: Insufficient documentation

## 2014-09-08 DIAGNOSIS — M546 Pain in thoracic spine: Secondary | ICD-10-CM

## 2014-09-08 DIAGNOSIS — K219 Gastro-esophageal reflux disease without esophagitis: Secondary | ICD-10-CM | POA: Insufficient documentation

## 2014-09-08 DIAGNOSIS — S3992XA Unspecified injury of lower back, initial encounter: Secondary | ICD-10-CM | POA: Insufficient documentation

## 2014-09-08 MED ORDER — DIPHENHYDRAMINE HCL 25 MG PO CAPS
50.0000 mg | ORAL_CAPSULE | Freq: Once | ORAL | Status: AC
  Administered 2014-09-08: 50 mg via ORAL
  Filled 2014-09-08: qty 2

## 2014-09-08 MED ORDER — DEXAMETHASONE SODIUM PHOSPHATE 4 MG/ML IJ SOLN: 10.0000 mg | Freq: Once | INTRAMUSCULAR | Status: DC

## 2014-09-08 MED ORDER — DEXAMETHASONE SODIUM PHOSPHATE 4 MG/ML IJ SOLN
10.0000 mg | Freq: Once | INTRAMUSCULAR | Status: AC
  Administered 2014-09-08: 10 mg via INTRAMUSCULAR
  Filled 2014-09-08: qty 3

## 2014-09-08 MED ORDER — HYDROCODONE-ACETAMINOPHEN 5-325 MG PO TABS: ORAL_TABLET | ORAL | Status: DC

## 2014-09-08 MED ORDER — DIPHENHYDRAMINE HCL 25 MG PO TABS: 25.0000 mg | ORAL_TABLET | ORAL | Status: DC | PRN

## 2014-09-08 MED ORDER — PREDNISONE 10 MG PO TABS: ORAL_TABLET | ORAL | Status: DC

## 2014-09-08 NOTE — ED Notes (Signed)
PA at bedside.

## 2014-09-08 NOTE — ED Notes (Signed)
Pt states she began having a rash all over her body on 11/6 and on 11/7 she began having upper back pain that becomes worse with movement. Pt has raised rashes over arms, abdomen, lower back, and genital area.

## 2014-09-08 NOTE — Discharge Instructions (Signed)
Back Pain, Adult Back pain is very common. The pain often gets better over time. The cause of back pain is usually not dangerous. Most people can learn to manage their back pain on their own.  HOME CARE   Stay active. Start with short walks on flat ground if you can. Try to walk farther each day.  Do not sit, drive, or stand in one place for more than 30 minutes. Do not stay in bed.  Do not avoid exercise or work. Activity can help your back heal faster.  Be careful when you bend or lift an object. Bend at your knees, keep the object close to you, and do not twist.  Sleep on a firm mattress. Lie on your side, and bend your knees. If you lie on your back, put a pillow under your knees.  Only take medicines as told by your doctor.  Put ice on the injured area.  Put ice in a plastic bag.  Place a towel between your skin and the bag.  Leave the ice on for 15-20 minutes, 03-04 times a day for the first 2 to 3 days. After that, you can switch between ice and heat packs.  Ask your doctor about back exercises or massage.  Avoid feeling anxious or stressed. Find good ways to deal with stress, such as exercise. GET HELP RIGHT AWAY IF:   Your pain does not go away with rest or medicine.  Your pain does not go away in 1 week.  You have new problems.  You do not feel well.  The pain spreads into your legs.  You cannot control when you poop (bowel movement) or pee (urinate).  Your arms or legs feel weak or lose feeling (numbness).  You feel sick to your stomach (nauseous) or throw up (vomit).  You have belly (abdominal) pain.  You feel like you may pass out (faint). MAKE SURE YOU:   Understand these instructions.  Will watch your condition.  Will get help right away if you are not doing well or get worse. Document Released: 04/05/2008 Document Revised: 01/10/2012 Document Reviewed: 02/19/2014 [facility] Patient Information 2015 [place], [place]. This information is not intended  to replace advice given to you by your health care provider. Make sure you discuss any questions you have with your health care provider.  Rash A rash is a change in the color or feel of your skin. There are many different types of rashes. You may have other problems along with your rash. HOME CARE  Avoid the thing that caused your rash.  Do not scratch your rash.  You may take cools baths to help stop itching.  Only take medicines as told by your doctor.  Keep all doctor visits as told. GET HELP RIGHT AWAY IF:   Your pain, puffiness (swelling), or redness gets worse.  You have a fever.  You have new or severe problems.  You have body aches, watery poop (diarrhea), or you throw up (vomit).  Your rash is not better after 3 days. MAKE SURE YOU:   Understand these instructions.  Will watch your condition.  Will get help right away if you are not doing well or get worse. Document Released: 04/05/2008 Document Revised: 01/10/2012 Document Reviewed: 08/02/2011 [facility] Patient Information 2015 [place], [place]. This information is not intended to replace advice given to you by your health care provider. Make sure you discuss any questions you have with your health care provider.

## 2014-09-10 NOTE — ED Provider Notes (Signed)
CSN: [id number]     Arrival date & time 09/08/14  [id number] History   First MD Initiated Contact with Patient 09/08/14 1028     Chief Complaint  Patient presents with  . Rash  . Back Pain     (Consider location/radiation/quality/duration/timing/severity/associated sxs/prior Treatment) HPI   Katrina Good is a 43 y.o. female who presents to the Emergency Department complaining of rash for two days.  She states it began on her chest and arms and has now spread to her legs and lower back.  She also c/o pain to her mid back after heavy lifting.  She describes as "soreness" that's worse with movement of her arms and reaching.  She also reports itching to the rash.  She denies exposure to chemical, new medications or foods.  She also denies difficulty swallowing or breathing or facial swelling.     Past Medical History  Diagnosis Date  . Hypertension   . Diabetes mellitus   . Substance abuse     pt denies  . Suicidal thoughts     pt denies  . Asthma   . Bronchitis   . Acid reflux    Past Surgical History  Procedure Laterality Date  . Abdominal hysterectomy    . Cesarean section      x 2   Family History  Problem Relation Age of Onset  . Diabetes Other   . Heart disease    . Arthritis    . Lung disease    . Cancer     History  Substance Use Topics  . Smoking status: Current Every Day Smoker -- 0.50 packs/day for 25 years    Types: Cigarettes  . Smokeless tobacco: Never Used  . Alcohol Use: No   OB History    Gravida Para Term Preterm AB TAB SAB Ectopic Multiple Living   5 2 2  3 1 2   2      Review of Systems  Constitutional: Negative for fever, chills, activity change and appetite change.  HENT: Negative for facial swelling, sore throat and trouble swallowing.   Respiratory: Negative for chest tightness, shortness of breath and wheezing.   Gastrointestinal: Negative for vomiting, abdominal pain and constipation.  Genitourinary: Negative for dysuria, hematuria, flank  pain, decreased urine volume and difficulty urinating.       Low back pain  Musculoskeletal: Positive for back pain. Negative for joint swelling, neck pain and neck stiffness.  Skin: Positive for rash. Negative for wound.  Neurological: Negative for dizziness, weakness, numbness and headaches.  All other systems reviewed and are negative.     Allergies  Doxycycline  Home Medications   Prior to Admission medications   Medication Sig Start Date End Date Taking? Authorizing Provider  glipiZIDE (GLUCOTROL XL) 2.5 MG 24 hr tablet Take 2.5 mg by mouth daily.   Yes Historical Provider, MD  lisinopril (PRINIVIL,ZESTRIL) 5 MG tablet Take 5 mg by mouth daily.    Yes Historical Provider, MD  ranitidine (ZANTAC) 150 MG capsule Take 1 capsule (150 mg total) by mouth 2 (two) times daily. 01/28/13  Yes Benny LennertJoseph L Zammit, MD  albuterol (PROVENTIL HFA;VENTOLIN HFA) 108 (90 BASE) MCG/ACT inhaler Inhale 2 puffs into the lungs every 6 (six) hours as needed for wheezing.    Historical Provider, MD  diphenhydrAMINE (BENADRYL) 25 MG tablet Take 1 tablet (25 mg total) by mouth every 4 (four) hours as needed for itching. 09/08/14   Itzae Mccurdy L. Yohann Curl, PA-C  HYDROcodone-acetaminophen (NORCO/VICODIN) 5-325 MG per  tablet Take one-two tabs po q 4-6 hrs prn pain 09/08/14   Klayten Jolliff L. Blakelynn Scheeler, PA-C  ibuprofen (ADVIL,MOTRIN) 200 MG tablet Take 200 mg by mouth daily as needed for moderate pain.    Historical Provider, MD  predniSONE (DELTASONE) 10 MG tablet Take 6 tablets day one, 5 tablets day two, 4 tablets day three, 3 tablets day four, 2 tablets day five, then 1 tablet day six 09/08/14   Mizuki Hoel L. Jaelene Garciagarcia, PA-C   BP 159/79 mmHg  Pulse 79  Temp(Src) 98.5 F (36.9 C) (Oral)  Resp 16  Ht 5\' 9"  (1.753 m)  Wt 216 lb (97.977 kg)  BMI 31.88 kg/m2  SpO2 100% Physical Exam  Constitutional: She is oriented to person, place, and time. She appears well-developed and well-nourished. No distress.  HENT:  Head: Normocephalic and  atraumatic.  Mouth/Throat: Uvula is midline, oropharynx is clear and moist and mucous membranes are normal. No oral lesions. No uvula swelling.  Neck: Normal range of motion. Neck supple.  Cardiovascular: Normal rate, regular rhythm, normal heart sounds and intact distal pulses.   No murmur heard. Pulmonary/Chest: Effort normal and breath sounds normal. No respiratory distress. She has no wheezes.  Abdominal: Soft. She exhibits no distension. There is no tenderness. There is no rebound and no guarding.  Musculoskeletal: She exhibits tenderness. She exhibits no edema.  ttp along the bilateral scapular border.  No midline tenderness  Lymphadenopathy:    She has no cervical adenopathy.  Neurological: She is alert and oriented to person, place, and time. She exhibits normal muscle tone. Coordination normal.  Skin: Skin is warm. Rash noted. There is erythema.  slightly raised erythematous lesions to the lower back, chest and bilateral upper and lower extremities.  no pustules, vesicles or edema.    Nursing note and vitals reviewed.   ED Course  Procedures (including critical care time) Labs Review Labs Reviewed - No data to display  Imaging Review No results found.   EKG Interpretation None      MDM   Final diagnoses:  Allergic reaction, initial encounter  Bilateral thoracic back pain    Pt with scattered hives to various areas of the body.  No edema, airway patent.    Pt has been observed in the dept w/o further issue, feeling better after decadron and benadryl.  Appears stable for d/c.  Agrees to prednisone taper, benadryl and vicodin.  agrees to return here if needed.  Jnyah Brazee L. Trisha Mangleriplett, PA-C 09/10/14 1308  Rolland PorterMark James, MD 09/20/14 [phone]

## 2014-10-10 ENCOUNTER — Encounter (HOSPITAL_COMMUNITY): Payer: Self-pay | Admitting: Emergency Medicine

## 2014-10-10 ENCOUNTER — Emergency Department (HOSPITAL_COMMUNITY)
Admission: EM | Admit: 2014-10-10 | Discharge: 2014-10-10 | Discharge status: Against Medical Advice | Payer: Self-pay | Attending: Emergency Medicine | Admitting: Emergency Medicine

## 2014-10-10 DIAGNOSIS — R110 Nausea: Secondary | ICD-10-CM | POA: Insufficient documentation

## 2014-10-10 DIAGNOSIS — I10 Essential (primary) hypertension: Secondary | ICD-10-CM | POA: Insufficient documentation

## 2014-10-10 DIAGNOSIS — K219 Gastro-esophageal reflux disease without esophagitis: Secondary | ICD-10-CM | POA: Insufficient documentation

## 2014-10-10 DIAGNOSIS — J449 Chronic obstructive pulmonary disease, unspecified: Secondary | ICD-10-CM | POA: Insufficient documentation

## 2014-10-10 DIAGNOSIS — R109 Unspecified abdominal pain: Secondary | ICD-10-CM

## 2014-10-10 DIAGNOSIS — E119 Type 2 diabetes mellitus without complications: Secondary | ICD-10-CM | POA: Insufficient documentation

## 2014-10-10 DIAGNOSIS — Z79899 Other long term (current) drug therapy: Secondary | ICD-10-CM | POA: Insufficient documentation

## 2014-10-10 DIAGNOSIS — N898 Other specified noninflammatory disorders of vagina: Secondary | ICD-10-CM | POA: Insufficient documentation

## 2014-10-10 DIAGNOSIS — R195 Other fecal abnormalities: Secondary | ICD-10-CM | POA: Insufficient documentation

## 2014-10-10 DIAGNOSIS — Z720 Tobacco use: Secondary | ICD-10-CM | POA: Insufficient documentation

## 2014-10-10 DIAGNOSIS — Z90710 Acquired absence of both cervix and uterus: Secondary | ICD-10-CM | POA: Insufficient documentation

## 2014-10-10 DIAGNOSIS — Z87440 Personal history of urinary (tract) infections: Secondary | ICD-10-CM | POA: Insufficient documentation

## 2014-10-10 DIAGNOSIS — R1030 Lower abdominal pain, unspecified: Secondary | ICD-10-CM | POA: Insufficient documentation

## 2014-10-10 LAB — CBC WITH DIFFERENTIAL/PLATELET
BASOS ABS: 0.1 10*3/uL (ref 0.0–0.1)
BASOS PCT: 1 % (ref 0–1)
EOS PCT: 1 % (ref 0–5)
Eosinophils Absolute: 0.1 10*3/uL (ref 0.0–0.7)
HCT: 41.1 % (ref 36.0–46.0)
Hemoglobin: 13.6 g/dL (ref 12.0–15.0)
LYMPHS PCT: 24 % (ref 12–46)
Lymphs Abs: 1.9 10*3/uL (ref 0.7–4.0)
MCH: 26.6 pg (ref 26.0–34.0)
MCHC: 33.1 g/dL (ref 30.0–36.0)
MCV: 80.3 fL (ref 78.0–100.0)
Monocytes Absolute: 0.6 10*3/uL (ref 0.1–1.0)
Monocytes Relative: 7 % (ref 3–12)
Neutro Abs: 5.3 10*3/uL (ref 1.7–7.7)
Neutrophils Relative %: 67 % (ref 43–77)
PLATELETS: 204 10*3/uL (ref 150–400)
RBC: 5.12 MIL/uL — ABNORMAL HIGH (ref 3.87–5.11)
RDW: 15.7 % — ABNORMAL HIGH (ref 11.5–15.5)
WBC: 7.8 10*3/uL (ref 4.0–10.5)

## 2014-10-10 LAB — COMPREHENSIVE METABOLIC PANEL
ALBUMIN: 3.2 g/dL — AB (ref 3.5–5.2)
ALT: 13 U/L (ref 0–35)
AST: 13 U/L (ref 0–37)
Alkaline Phosphatase: 115 U/L (ref 39–117)
Anion gap: 10 (ref 5–15)
BUN: 10 mg/dL (ref 6–23)
CALCIUM: 8.8 mg/dL (ref 8.4–10.5)
CO2: 26 mEq/L (ref 19–32)
Chloride: 106 mEq/L (ref 96–112)
Creatinine, Ser: 0.89 mg/dL (ref 0.50–1.10)
GFR calc Af Amer: >90 mL/min (ref >90)
GFR calc non Af Amer: 78 mL/min — ABNORMAL LOW (ref >90)
Glucose, Bld: 120 mg/dL — ABNORMAL HIGH (ref 70–99)
Potassium: 3.8 mEq/L (ref 3.7–5.3)
SODIUM: 142 meq/L (ref 137–147)
Total Bilirubin: 0.4 mg/dL (ref 0.3–1.2)
Total Protein: 7.1 g/dL (ref 6.0–8.3)

## 2014-10-10 NOTE — ED Notes (Signed)
Pt c/o dark red rectal bleeding and rlq/right flank pain x 2 days. Some nausea. lnbm yesterday.

## 2014-10-10 NOTE — ED Provider Notes (Addendum)
CSN: [id number]     Arrival date & time 10/10/14  0920 History  This chart was scribed for Donnetta HutchingBrian Neda Willenbring, MD by Ronney LionSuzanne Le, ED Scribe. This patient was seen in room APA11/[facility]11 and the patient's care was started at 10:32 AM.    Chief Complaint  Patient presents with  . Rectal Bleeding    The history is provided by the patient. No language interpreter was used.    HPI Comments: Katrina Good is a 43 y.o. female who presents to the Emergency Department complaining of sore lower abdominal pain that is worse on the right side and right flank pain "like someone had broken my bones" for about a week. Patient complains of burning dysuria and blood in her stool, "enough to fill the tissue when wiping." She had not had a BM today. Patient had a similar pain when she was told she had a bladder infection. She has tried Prednisone, antibiotics, Advil, Aleve, and Tramadol, with no relief.  Patient also notes a vaginal bacterial infection with associated discharge that she was given a 7-day course of medication for. She hasn't taken her medication due to a side effect of nausea. Patient denies blood in urine and has no menstrual periods due to a hysterectomy. She works as a Engineer, siteprivate sitter.  Past Medical History  Diagnosis Date  . Hypertension   . Diabetes mellitus   . Substance abuse     pt denies  . Suicidal thoughts     pt denies  . Asthma   . Bronchitis   . Acid reflux    Past Surgical History  Procedure Laterality Date  . Abdominal hysterectomy    . Cesarean section      x 2   Family History  Problem Relation Age of Onset  . Diabetes Other   . Heart disease    . Arthritis    . Lung disease    . Cancer     History  Substance Use Topics  . Smoking status: Current Every Day Smoker -- 0.50 packs/day for 25 years    Types: Cigarettes  . Smokeless tobacco: Never Used  . Alcohol Use: No   OB History    Gravida Para Term Preterm AB TAB SAB Ectopic Multiple Living   5 2 2  3 1 2   2       Review of Systems  Gastrointestinal: Positive for nausea, abdominal pain and blood in stool.  Genitourinary: Positive for dysuria, flank pain and vaginal discharge. Negative for menstrual problem.      Allergies  Doxycycline  Home Medications   Prior to Admission medications   Medication Sig Start Date End Date Taking? Authorizing Provider  albuterol (PROVENTIL HFA;VENTOLIN HFA) 108 (90 BASE) MCG/ACT inhaler Inhale 2 puffs into the lungs every 6 (six) hours as needed for wheezing.   Yes Historical Provider, MD  diphenhydrAMINE (BENADRYL) 25 MG tablet Take 1 tablet (25 mg total) by mouth every 4 (four) hours as needed for itching. 09/08/14  Yes Tammy L. Triplett, PA-C  glipiZIDE (GLUCOTROL XL) 2.5 MG 24 hr tablet Take 2.5 mg by mouth daily.   Yes Historical Provider, MD  ibuprofen (ADVIL,MOTRIN) 200 MG tablet Take 200 mg by mouth daily as needed for moderate pain.   Yes Historical Provider, MD  lisinopril (PRINIVIL,ZESTRIL) 5 MG tablet Take 5 mg by mouth daily.    Yes Historical Provider, MD  ranitidine (ZANTAC) 150 MG capsule Take 1 capsule (150 mg total) by mouth 2 (two) times daily.  01/28/13  Yes Benny LennertJoseph L Zammit, MD  HYDROcodone-acetaminophen (NORCO/VICODIN) 5-325 MG per tablet Take one-two tabs po q 4-6 hrs prn pain Patient not taking: Reported on 10/10/2014 09/08/14   Tammy L. Triplett, PA-C  predniSONE (DELTASONE) 10 MG tablet Take 6 tablets day one, 5 tablets day two, 4 tablets day three, 3 tablets day four, 2 tablets day five, then 1 tablet day six Patient not taking: Reported on 10/10/2014 09/08/14   Tammy L. Triplett, PA-C   BP 127/91 mmHg  Pulse 89  Temp(Src) 98.8 F (37.1 C)  Resp 18  Ht 5\' 9"  (1.753 m)  Wt 220 lb (99.791 kg)  BMI 32.47 kg/m2  SpO2 96% Physical Exam  Constitutional: She is oriented to person, place, and time. She appears well-developed and well-nourished.  HENT:  Head: Normocephalic and atraumatic.  Eyes: Conjunctivae and EOM are normal. Pupils are  equal, round, and reactive to light.  Neck: Normal range of motion. Neck supple.  Cardiovascular: Normal rate, regular rhythm and normal heart sounds.   Pulmonary/Chest: Effort normal and breath sounds normal.  Abdominal: Soft. Bowel sounds are normal. There is tenderness.  Tender to lower abdominal region greater on the right side. Minimal tenderness to right flank.   Musculoskeletal: Normal range of motion.  Neurological: She is alert and oriented to person, place, and time.  Skin: Skin is warm and dry.  Psychiatric: She has a normal mood and affect. Her behavior is normal.  Nursing note and vitals reviewed.   ED Course  Procedures (including critical care time)  DIAGNOSTIC STUDIES: Oxygen Saturation is 96% on room air, normal by my interpretation.    COORDINATION OF CARE: 10:36 AM - Discussed treatment plan with pt at bedside which includes UA and blood comp and pt agreed to plan.   Labs Review Labs Reviewed  CBC WITH DIFFERENTIAL - Abnormal; Notable for the following:    RBC 5.12 (*)    RDW 15.7 (*)    All other components within normal limits  COMPREHENSIVE METABOLIC PANEL - Abnormal; Notable for the following:    Glucose, Bld 120 (*)    Albumin 3.2 (*)    GFR calc non Af Amer 78 (*)    All other components within normal limits    Imaging Review No results found.   EKG Interpretation None      MDM   Final diagnoses:  Abdominal pain, unspecified abdominal location   No acute abdomen. Patient decided to leave AMA prior to testing been completed  I personally performed the services described in this documentation, which was scribed in my presence. The recorded information has been reviewed and is accurate.         Donnetta HutchingBrian [facility] Stfleur, MD 10/12/14 1142  Donnetta HutchingBrian Erna Brossard, MD 10/12/14 [phone]

## 2014-10-10 NOTE — ED Notes (Signed)
Pt states she is unable to collect urine sample at this time.

## 2014-10-10 NOTE — ED Notes (Signed)
Pt requested to sign out AMA, states she "just changed her mind". Benefits/risks explained to pt.

## 2014-11-02 ENCOUNTER — Encounter (HOSPITAL_COMMUNITY): Payer: Self-pay | Admitting: Emergency Medicine

## 2014-11-02 ENCOUNTER — Emergency Department (HOSPITAL_COMMUNITY)
Admission: EM | Admit: 2014-11-02 | Discharge: 2014-11-02 | Disposition: A | Discharge status: Home / Self Care | Payer: Self-pay | Attending: Emergency Medicine | Admitting: Emergency Medicine

## 2014-11-02 DIAGNOSIS — Z79899 Other long term (current) drug therapy: Secondary | ICD-10-CM | POA: Insufficient documentation

## 2014-11-02 DIAGNOSIS — E119 Type 2 diabetes mellitus without complications: Secondary | ICD-10-CM | POA: Insufficient documentation

## 2014-11-02 DIAGNOSIS — J45901 Unspecified asthma with (acute) exacerbation: Secondary | ICD-10-CM | POA: Insufficient documentation

## 2014-11-02 DIAGNOSIS — I10 Essential (primary) hypertension: Secondary | ICD-10-CM | POA: Insufficient documentation

## 2014-11-02 DIAGNOSIS — Z720 Tobacco use: Secondary | ICD-10-CM | POA: Insufficient documentation

## 2014-11-02 DIAGNOSIS — L089 Local infection of the skin and subcutaneous tissue, unspecified: Secondary | ICD-10-CM | POA: Insufficient documentation

## 2014-11-02 DIAGNOSIS — L732 Hidradenitis suppurativa: Secondary | ICD-10-CM | POA: Insufficient documentation

## 2014-11-02 DIAGNOSIS — K219 Gastro-esophageal reflux disease without esophagitis: Secondary | ICD-10-CM | POA: Insufficient documentation

## 2014-11-02 LAB — CBG MONITORING, ED: Glucose-Capillary: 133 mg/dL — ABNORMAL HIGH (ref 70–99)

## 2014-11-02 MED ORDER — CEPHALEXIN 500 MG PO CAPS: 500.0000 mg | ORAL_CAPSULE | Freq: Four times a day (QID) | ORAL | Status: DC

## 2014-11-02 MED ORDER — HYDROCODONE-ACETAMINOPHEN 5-325 MG PO TABS
1.0000 | ORAL_TABLET | Freq: Once | ORAL | Status: AC
  Administered 2014-11-02: 1 via ORAL
  Filled 2014-11-02: qty 1

## 2014-11-02 MED ORDER — SULFAMETHOXAZOLE-TRIMETHOPRIM 800-160 MG PO TABS: 1.0000 | ORAL_TABLET | Freq: Two times a day (BID) | ORAL | Status: DC

## 2014-11-02 MED ORDER — HYDROCODONE-ACETAMINOPHEN 5-325 MG PO TABS: 1.0000 | ORAL_TABLET | ORAL | Status: DC | PRN

## 2014-11-02 MED ORDER — MUPIROCIN CALCIUM 2 % EX CREA: TOPICAL_CREAM | Freq: Two times a day (BID) | CUTANEOUS | Status: DC

## 2014-11-02 MED ORDER — LIDOCAINE HCL (PF) 2 % IJ SOLN
INTRAMUSCULAR | Status: AC
  Filled 2014-11-02: qty 10

## 2014-11-02 MED ORDER — MUPIROCIN 2 % EX OINT
TOPICAL_OINTMENT | CUTANEOUS | Status: AC
  Filled 2014-11-02: qty 22

## 2014-11-02 MED ORDER — MUPIROCIN CALCIUM 2 % EX CREA
TOPICAL_CREAM | Freq: Two times a day (BID) | CUTANEOUS | Status: DC
  Administered 2014-11-02: 18:00:00 via TOPICAL
  Filled 2014-11-02: qty 15

## 2014-11-02 NOTE — Discharge Instructions (Signed)
Hidradenitis Suppurativa, Sweat Gland Abscess Hidradenitis suppurativa is a long lasting (chronic), uncommon disease of the sweat glands. With this, boil-like lumps and scarring develop in the groin, some times under the arms (axillae), and under the breasts. It may also uncommonly occur behind the ears, in the crease of the buttocks, and around the genitals.  CAUSES  The cause is from a blocking of the sweat glands. They then become infected. It may cause drainage and odor. It is not contagious. So it cannot be given to someone else. It most often shows up in puberty (about 28 to 44 years of age). But it may happen much later. It is similar to acne which is a disease of the sweat glands. This condition is slightly more common in African-Americans and women. SYMPTOMS   Hidradenitis usually starts as one or more red, tender, swellings in the groin or under the arms (axilla).  Over a period of hours to days the lesions get larger. They often open to the skin surface, draining clear to yellow-colored fluid.  The infected area heals with scarring. DIAGNOSIS  Your caregiver makes this diagnosis by looking at you. Sometimes cultures (growing germs on plates in the lab) may be taken. This is to see what germ (bacterium) is causing the infection.  TREATMENT   Topical germ killing medicine applied to the skin (antibiotics) are the treatment of choice. Antibiotics taken by mouth (systemic) are sometimes needed when the condition is getting worse or is severe.  Avoid tight-fitting clothing which traps moisture in.  Dirt does not cause hidradenitis and it is not caused by poor hygiene.  Involved areas should be cleaned daily using an antibacterial soap. Some patients find that the liquid form of Lever 2000, applied to the involved areas as a lotion after bathing, can help reduce the odor related to this condition.  Sometimes surgery is needed to drain infected areas or remove scarred tissue. Removal of  large amounts of tissue is used only in severe cases.  Birth control pills may be helpful.  Oral retinoids (vitamin A derivatives) for 6 to 12 months which are effective for acne may also help this condition.  Weight loss will improve but not cure hidradenitis. It is made worse by being overweight. But the condition is not caused by being overweight.  This condition is more common in people who have had acne.  It may become worse under stress. There is no medical cure for hidradenitis. It can be controlled, but not cured. The condition usually continues for years with periods of getting worse and getting better (remission). Document Released: 06/01/2004 Document Revised: 01/10/2012 Document Reviewed: 01/18/2014 [facility] Patient Information 2015 [place], [place]. This information is not intended to replace advice given to you by your health care provider. Make sure you discuss any questions you have with your health care provider.   Emergency Department Resource Guide 1) Find a Doctor and Pay Out of Pocket Although you won't have to find out who is covered by your insurance plan, it is a good idea to ask around and get recommendations. You will then need to call the office and see if the doctor you have chosen will accept you as a new patient and what types of options they offer for patients who are self-pay. Some doctors offer discounts or will set up payment plans for their patients who do not have insurance, but you will need to ask so you aren't surprised when you get to your appointment.  2) Contact  Your Local Health Department Not all health departments have doctors that can see patients for sick visits, but many do, so it is worth a call to see if yours does. If you don't know where your local health department is, you can check in your phone book. The CDC also has a tool to help you locate your state's health department, and many state websites also have listings of all of their local  health departments.  3) Find a Walk-in Clinic If your illness is not likely to be very severe or complicated, you may want to try a walk in clinic. These are popping up all over the country in pharmacies, drugstores, and shopping centers. They're usually staffed by nurse practitioners or physician assistants that have been trained to treat common illnesses and complaints. They're usually fairly quick and inexpensive. However, if you have serious medical issues or chronic medical problems, these are probably not your best option.  No Primary Care Doctor: - Call Health Connect at  [phone] - they can help you locate a primary care doctor that  accepts your insurance, provides certain services, etc. - Physician Referral Service- [phone]  Chronic Pain Problems: Organization         Address  Phone   Notes  [facility]  [phone] Patients need to be referred by their primary care doctor.   Medication Assistance: Organization         Address  Phone   Notes  [facility] Medication [facility] [address] [place] [place], [place] [address] [phone] --Must be a resident of [facility] -- Must have NO insurance coverage whatsoever (no Medicaid/ Medicare, etc.) -- The pt. MUST have a primary care doctor that directs their care regularly and follows them in the community   MedAssist  [phone]   [employer]  [phone]    Agencies that provide inexpensive medical care: Organization         Address  Phone   Notes  [facility]  [phone]   [facility]    [phone]   [facility] [address] [place], [place] [address] [phone]   [facility] [address] [place], [place] [phone]   Planned Parenthood    [phone]   [facility]    [phone]   Community Health and [facility]  [address], [place] Phone:   [phone], Fax:  [phone] Hours of Operation:  9 am - 6 pm, M-F.  Also accepts Medicaid/Medicare and self-pay.  [facility]  [address], [place] Phone: [phone], Fax: [phone]. Hours of Operation:  8:30 am - 5:30 pm, M-F.  Also accepts Medicaid and self-pay.  [facility] [place] [address] [place] Phone: [phone]   [facility] [address] Natasha Bence [place], [place] [phone], Ext. [phone] Mondays & Thursdays: 7-9 AM.  First 15 patients are seen on a first come, first serve basis.    Medicaid-accepting [facility] Providers:  Organization         Address  Phone   Notes  [facility] [address], [place] [phone] Also accepts self-pay patients.  [facility] [address] Laurell Josephs [place], [place]  [phone]   [facility] [address], [address] (  [phone]   [facility] [address] [place] [phone]   Renaye Rakers [address], [place]   [phone] Only accepts [place] Access [place] patients after they have their name applied to their card.   Self-Pay (no insurance) in [facility] [place] [facility]:  Organization         Address  Phone   Notes  Sickle Cell Patients, [facility] [address] [place], [place] [phone]   [facility] [address] [place], [place] [phone]   [facility]  [address], [place] [phone]   [facility]/Dr. Osei-Bonsu  [address] [place] or [address], [place] [phone] Phone number for both [place] and [place] locations is the same.  Urgent Medical and [facility] [address], [place] [phone]   [facility] [address], [place] or [address] [phone]   [facility] [address] [place] [phone], phone; [phone], fax Sees patients 1st and 3rd Saturday of every month.  Must not qualify for public or private insurance (i.e. Medicaid, Medicare, [place] Health Choice, Veterans' Benefits)  Household income should be no more than 200% of the poverty level The clinic cannot treat you if you are pregnant or think you are pregnant  Sexually transmitted diseases are not treated at the clinic.    Dental Care: Organization         Address  Phone  Notes  [facility] of [facility] [address] [place], [place] [phone] Accepts children up to age 67 who are enrolled in [place] or [place] Health Choice; pregnant women with a Medicaid card; and children who have applied for Medicaid or [place] Health Choice, but were declined, whose parents can pay a reduced fee at time of service.  [facility] of [facility]  [address], [place] [phone] Accepts children up to age 54 who are enrolled in [place] or [place] Health Choice; pregnant women with a Medicaid card; and children who have applied for Medicaid or [place] Health Choice, but were declined, whose parents can pay a reduced fee at time of service.  [facility]  [address] [place], [place] [phone] Patients are seen by appointment only. Walk-ins are not accepted. [facility] will see patients 66 years of age and older. Monday - Tuesday (8am-5pm) Most Wednesdays (8:30-5pm) $30 per visit, cash only  [facility]  [address], [facility] [phone] Patients are seen by appointment only. Walk-ins are not accepted. [facility] will see patients 48 years of age and older. One Wednesday Evening (Monthly: Volunteer Based).  $30 per visit, cash only  [employer]  [phone] for adults;  Children under age 74, call Graduate Pediatric Dentistry at [phone]. Children aged 76-14, please call [phone] to request a pediatric application.  Dental services are provided in all areas of dental care including fillings, crowns and bridges, complete and partial dentures, implants, gum treatment, root canals, and extractions. Preventive care is also provided. Treatment is provided to both adults and children. Patients are selected via a lottery and there is often a waiting list.   [facility] [address], Ginette Otto  [phone] [email or web address]   [facility] [address] [place], [place] [phone], Ext. [phone] Second and Fourth Thursday of each month, opens at 6:30 AM; Clinic ends at 9 AM.  Patients are seen on a first-come first-served basis, and a limited number are seen during each clinic.   [facility]  [address] Hillard Danker [place], [place] [phone]   Eligibility Requirements You must have lived in [place], [place], or [place] counties for at least the last three months.   You cannot be eligible for state or federal sponsored [employer], including [employer], [place], or [employer].   You generally cannot be eligible for healthcare insurance through your employer.    How to apply: Eligibility screenings are held every Tuesday and Wednesday afternoon from 1:00 pm until 4:00 pm. You do not need an appointment for the interview!  [facility] [address], [place], [employer]   [place]  [place] Department  [place]  [phone]    Behavioral Health Resources in the Community: Intensive Outpatient Programs Organization         Address  Phone  Notes  [employer] [address], [place], [place] [phone]   [facility] Outpatient [address] [place], [place]   [facility]: [facility] [address] [place], [place]   [place] [address],  [place], [place] or [phone]   Substance Abuse Resources Organization         Address  Phone  Notes  Alcohol [facility] Drug Services  [phone]   [place]  [phone]   [facility] [place]   Chinita Pester  [phone]   Residential & Outpatient Substance Abuse Program  [phone]   Psychological Services Organization         Address  Phone  Notes  [facility]  [place]  [phone]   [place] [address], [place] or [phone]    Mobile Crisis Teams Organization         Address  Phone  Notes  [facility], Mobile Crisis Care Unit  [phone]   [facility]  [address] [place], [place]   [place] Levels [address], [place] [phone]    Self-Help/Support Groups Organization         Address  Phone             Notes  [place]. of [place] - variety of support groups  [place] Call for more information  Narcotics Anonymous (NA), [facility] [address], [employer] [place]  2 meetings at this location   Special educational needs teacher         Address  Phone  Notes  [facility] [place],    [place]  [phone]   [facility]  [address], [place] 989211, [place], [place]   [place], [place] [phone] Admissions: 8am-3pm M-F  Incentives Substance [place] [address],    [place], [place] [phone]   [facility] [address] Jadene Pierini [place], [place]   [facility] [address],  [place], [place]   [facility]ight Programs -  Intensive  Outpatient [address] Laurell Josephs [address], [place], [place] [phone]   [facility] ([facility] [address] [place]  [place], [place] [phone] or [phone]   [facility] ([facility]) [address], [place], [place] [phone] Accepts Medicaid  [facility] [place] [address]  [place] [phone] Substance Abuse/Addiction Treatment   [facility] Organization         Address  Phone  Notes  [facility]  [phone]   Angie Fava, PhD [address] Ervin Knack [place], [place]   [phone] or [phone]   [facility]al   [address] [place], [place] [phone]   [facility] [address] [place], [place] [phone] Insurance/Medicaid/sponsorship through [facility] [address]                                    [place], [place] [phone] Therapy/tele-psych/case  [facility] [address] [place] [address], [place] [phone]    Dr. Lolly Mustache  [phone]   [facility] [place]  [employer] [facility]. 1) [address] [place] 2) [address] [place] 3)  [address], [place] [phone]  [phone]   [facility] Child Abuse Hotline 820-749-7194 or [phone] (After Hours)         Complete your entire course of the antibiotic.  Apply a small amount of the cream given to your groin infection twice daily after gently cleansing your skin with soap and water.

## 2014-11-02 NOTE — ED Notes (Signed)
Pt seen and evaluated by EDPa for initial assessment. 

## 2014-11-02 NOTE — ED Notes (Signed)
Pt contacted and informed of bactroban ointment, (cream not available). Pt to come by and pick up med.

## 2014-11-02 NOTE — ED Notes (Signed)
Patient c/o abscess x2. Per patient one in left inner groin and one on vagina. Per patient drainage from one on vagina. Denies any fevers. Per patient abscesses x3 weeks.

## 2014-11-03 NOTE — ED Provider Notes (Signed)
CSN: [id number]     Arrival date & time 11/02/14  1159 History   First MD Initiated Contact with Patient 11/02/14 1631     Chief Complaint  Patient presents with  . Abscess     (Consider location/radiation/quality/duration/timing/severity/associated sxs/prior Treatment) Patient is a 44 y.o. female presenting with abscess. The history is provided by the patient.  Abscess Location:  Ano-genital Ano-genital abscess location:  Groin Abscess quality: draining, induration, painful, redness and warmth   Red streaking: no   Duration:  2 weeks Progression:  Worsening Pain details:    Quality:  Throbbing and sharp   Severity:  Moderate Chronicity:  Recurrent Context: diabetes   Context comment:  Frequent groin abscesses Relieved by:  Draining/squeezing and warm compresses Worsened by:  Nothing tried Ineffective treatments:  None tried Associated symptoms: no fever, no nausea and no vomiting   Risk factors: prior abscess   Risk factors: no family hx of MRSA   Risk factors comment:  Sister with hx of hidradenitis suppurativa   Past Medical History  Diagnosis Date  . Hypertension   . Diabetes mellitus   . Substance abuse     pt denies  . Suicidal thoughts     pt denies  . Asthma   . Bronchitis   . Acid reflux    Past Surgical History  Procedure Laterality Date  . Abdominal hysterectomy    . Cesarean section      x 2   Family History  Problem Relation Age of Onset  . Diabetes Other   . Heart disease    . Arthritis    . Lung disease    . Cancer     History  Substance Use Topics  . Smoking status: Current Every Day Smoker -- 0.50 packs/day for 25 years    Types: Cigarettes  . Smokeless tobacco: Never Used  . Alcohol Use: No   OB History    Gravida Para Term Preterm AB TAB SAB Ectopic Multiple Living   Review of Systems  Constitutional: Negative for fever and chills.  Respiratory: Negative for shortness of breath and wheezing.    Gastrointestinal: Negative for nausea and vomiting.  Endocrine: Negative for polydipsia and polyuria.  Skin: Positive for color change.  Neurological: Negative for numbness.      Allergies  Doxycycline  Home Medications   Prior to Admission medications   Medication Sig Start Date End Date Taking? Authorizing Provider  albuterol (PROVENTIL HFA;VENTOLIN HFA) 108 (90 BASE) MCG/ACT inhaler Inhale 2 puffs into the lungs every 6 (six) hours as needed for wheezing.    Historical Provider, MD  diphenhydrAMINE (BENADRYL) 25 MG tablet Take 1 tablet (25 mg total) by mouth every 4 (four) hours as needed for itching. 09/08/14   Tammy L. Triplett, PA-C  glipiZIDE (GLUCOTROL XL) 2.5 MG 24 hr tablet Take 2.5 mg by mouth daily.    Historical Provider, MD  HYDROcodone-acetaminophen (NORCO/VICODIN) 5-325 MG per tablet Take 1 tablet by mouth every 4 (four) hours as needed. 11/02/14   Burgess Amor, PA-C  ibuprofen (ADVIL,MOTRIN) 200 MG tablet Take 200 mg by mouth daily as needed for moderate pain.    Historical Provider, MD  lisinopril (PRINIVIL,ZESTRIL) 5 MG tablet Take 5 mg by mouth daily.     Historical Provider, MD  predniSONE (DELTASONE) 10 MG tablet Take 6 tablets day one, 5 tablets day two, 4 tablets day three, 3 tablets day four,  2 tablets day five, then 1 tablet day six Patient not taking: Reported on 10/10/2014 09/08/14   Tammy L. Triplett, PA-C  ranitidine (ZANTAC) 150 MG capsule Take 1 capsule (150 mg total) by mouth 2 (two) times daily. 01/28/13   Benny Lennert, MD  sulfamethoxazole-trimethoprim (SEPTRA DS) 800-160 MG per tablet Take 1 tablet by mouth every 12 (twelve) hours. 11/02/14   Burgess Amor, PA-C   BP 128/76 mmHg  Pulse 89  Temp(Src) 98 F (36.7 C) (Oral)  Resp 18  Ht  (1.753 m)  Wt 222 lb (100.699 kg)  BMI 32.77 kg/m2  SpO2 100% Physical Exam  Constitutional: She is oriented to person, place, and time. She appears well-developed and well-nourished.  HENT:  Head: Normocephalic.   Cardiovascular: Normal rate.   Pulmonary/Chest: Effort normal.  Neurological: She is alert and oriented to person, place, and time. No sensory deficit.  Skin:  4 cm area of induration left groin with a small, soft raised papule on her lateral labia majora.  Both areas are approximated in this intertriginous region.  No active drainage.  Skin scarring consistent with hidradenitis suppurativa.  No red streaking.    ED Course  Procedures (including critical care time) Labs Review Labs Reviewed  CBG MONITORING, ED - Abnormal; Notable for the following:    Glucose-Capillary 133 (*)    All other components within normal limits    Imaging Review No results found.   Informal bedside US performed with no identifiable pus pocket.   EKG Interpretation None      MDM   Final diagnoses:  Skin infection  Hidradenitis suppurativa    Pt was prescribed bactrim, also gave mupuricin abx ointment for bid application.  Past wound cx negative for mrsa.  Advised warm soaks, avoid squeezing. Prescribed hydrocodone prn pain.  Referrals given for establishing pcp.  Advised recheck here for any worsened pain, swelling, new sx.  CBG controlled.     Burgess Amor, PA-C 11/03/14 1610  Flint Melter, MD 11/03/14 [phone]

## 2014-12-07 ENCOUNTER — Encounter (HOSPITAL_COMMUNITY): Payer: Self-pay | Admitting: Emergency Medicine

## 2014-12-07 ENCOUNTER — Emergency Department (HOSPITAL_COMMUNITY)
Admission: EM | Admit: 2014-12-07 | Discharge: 2014-12-07 | Disposition: A | Discharge status: Home / Self Care | Payer: Self-pay | Attending: Emergency Medicine | Admitting: Emergency Medicine

## 2014-12-07 DIAGNOSIS — L0292 Furuncle, unspecified: Secondary | ICD-10-CM | POA: Insufficient documentation

## 2014-12-07 DIAGNOSIS — Z720 Tobacco use: Secondary | ICD-10-CM | POA: Insufficient documentation

## 2014-12-07 DIAGNOSIS — K219 Gastro-esophageal reflux disease without esophagitis: Secondary | ICD-10-CM | POA: Insufficient documentation

## 2014-12-07 DIAGNOSIS — Z792 Long term (current) use of antibiotics: Secondary | ICD-10-CM | POA: Insufficient documentation

## 2014-12-07 DIAGNOSIS — I10 Essential (primary) hypertension: Secondary | ICD-10-CM | POA: Insufficient documentation

## 2014-12-07 DIAGNOSIS — Z7952 Long term (current) use of systemic steroids: Secondary | ICD-10-CM | POA: Insufficient documentation

## 2014-12-07 DIAGNOSIS — J45909 Unspecified asthma, uncomplicated: Secondary | ICD-10-CM | POA: Insufficient documentation

## 2014-12-07 DIAGNOSIS — Z79899 Other long term (current) drug therapy: Secondary | ICD-10-CM | POA: Insufficient documentation

## 2014-12-07 DIAGNOSIS — Z7951 Long term (current) use of inhaled steroids: Secondary | ICD-10-CM | POA: Insufficient documentation

## 2014-12-07 DIAGNOSIS — E119 Type 2 diabetes mellitus without complications: Secondary | ICD-10-CM | POA: Insufficient documentation

## 2014-12-07 MED ORDER — CEPHALEXIN 500 MG PO CAPS: 500.0000 mg | ORAL_CAPSULE | Freq: Four times a day (QID) | ORAL | Status: DC

## 2014-12-07 MED ORDER — SULFAMETHOXAZOLE-TRIMETHOPRIM 800-160 MG PO TABS: 1.0000 | ORAL_TABLET | Freq: Two times a day (BID) | ORAL | Status: AC

## 2014-12-07 NOTE — Discharge Instructions (Signed)
Soak in warm tub 3 times a day for 30 minutes.  Wash well after soaking with soap.  Use ibuprofen, for pain  Follow up with your Dr. for a checkup as soon as possible and discuss further treatments that can help to eliminate the recurrent infections that you're getting.

## 2014-12-07 NOTE — ED Provider Notes (Signed)
CSN: [id number]     Arrival date & time 12/07/14  1446 History  This chart was scribed for Flint Melter, MD by Freida Busman, ED Scribe. This patient was seen in room APFT23/APFT23 and the patient's care was started 3:18 PM.    Chief Complaint  Patient presents with  . Abscess    The history is provided by the patient. No language interpreter was used.      HPI Comments:  CHARENE Good is a 44 y.o. female who presents to the Emergency Department complaining of multiple boils to the inside of her left thigh/groin area for about 1 month. She reports a h/o same that have been incised and drained in the past. She denies fever. No alleviating factors noted or associated symptoms noted. [facility].    Past Medical History  Diagnosis Date  . Hypertension   . Diabetes mellitus   . Substance abuse     pt denies  . Suicidal thoughts     pt denies  . Asthma   . Bronchitis   . Acid reflux    Past Surgical History  Procedure Laterality Date  . Abdominal hysterectomy    . Cesarean section      x 2   Family History  Problem Relation Age of Onset  . Diabetes Other   . Heart disease    . Arthritis    . Lung disease    . Cancer     History  Substance Use Topics  . Smoking status: Current Every Day Smoker -- 0.50 packs/day for 25 years    Types: Cigarettes  . Smokeless tobacco: Never Used  . Alcohol Use: No   OB History    Gravida Para Term Preterm AB TAB SAB Ectopic Multiple Living   Review of Systems  Constitutional: Negative for fever.  Gastrointestinal: Negative for nausea and vomiting.  Skin: Positive for rash.  All other systems reviewed and are negative.     Allergies  Doxycycline  Home Medications   Prior to Admission medications   Medication Sig Start Date End Date Taking? Authorizing Provider  albuterol (PROVENTIL HFA;VENTOLIN HFA) 108 (90 BASE) MCG/ACT inhaler Inhale 2 puffs into the lungs every 6 (six)  hours as needed for wheezing.    Historical Provider, MD  cephALEXin (KEFLEX) 500 MG capsule Take 1 capsule (500 mg total) by mouth 4 (four) times daily. 12/07/14   Flint Melter, MD  diphenhydrAMINE (BENADRYL) 25 MG tablet Take 1 tablet (25 mg total) by mouth every 4 (four) hours as needed for itching. 09/08/14   Tammy L. Triplett, PA-C  glipiZIDE (GLUCOTROL XL) 2.5 MG 24 hr tablet Take 2.5 mg by mouth daily.    Historical Provider, MD  HYDROcodone-acetaminophen (NORCO/VICODIN) 5-325 MG per tablet Take 1 tablet by mouth every 4 (four) hours as needed. 11/02/14   Burgess Amor, PA-C  ibuprofen (ADVIL,MOTRIN) 200 MG tablet Take 200 mg by mouth daily as needed for moderate pain.    Historical Provider, MD  lisinopril (PRINIVIL,ZESTRIL) 5 MG tablet Take 5 mg by mouth daily.     Historical Provider, MD  predniSONE (DELTASONE) 10 MG tablet Take 6 tablets day one, 5 tablets day two, 4 tablets day three, 3 tablets day four, 2 tablets day five, then 1 tablet day six Patient not taking: Reported on 10/10/2014 09/08/14   Tammy L. Triplett, PA-C  ranitidine (ZANTAC) 150  MG capsule Take 1 capsule (150 mg total) by mouth 2 (two) times daily. 01/28/13   Benny LennertJoseph L Zammit, MD  sulfamethoxazole-trimethoprim (BACTRIM DS,SEPTRA DS) 800-160 MG per tablet Take 1 tablet by mouth 2 (two) times daily. 12/07/14 12/14/14  Flint MelterElliott L Lachlan Pelto, MD   BP 119/90 mmHg  Pulse 107  Temp(Src) 98.7 F (37.1 C)  Resp 18  Ht 5\' 9"  (1.753 m)  Wt 225 lb (102.059 kg)  BMI 33.21 kg/m2  SpO2 100% Physical Exam  Constitutional: She is oriented to person, place, and time. She appears well-developed and well-nourished. No distress.  HENT:  Head: Normocephalic and atraumatic.  Eyes: Conjunctivae and EOM are normal. Pupils are equal, round, and reactive to light.  Neck: Normal range of motion and phonation normal. Neck supple.  Cardiovascular: Normal rate and regular rhythm.   Pulmonary/Chest: Effort normal and breath sounds normal. She exhibits no  tenderness.  Abdominal: Soft. She exhibits no distension. There is no tenderness. There is no guarding.  Genitourinary:  No CVA tenderness    Musculoskeletal: Normal range of motion.  Bilateral lumbar TTP  Neurological: She is alert and oriented to person, place, and time. She exhibits normal muscle tone.  Skin: Skin is warm and dry.   4-5 furuncles of to left upper leg and mons pubis.  No areas of fluctuance or drainage noted.  Psychiatric: She has a normal mood and affect. Her behavior is normal. Judgment and thought content normal.  Nursing note and vitals reviewed.   ED Course  Procedures  DIAGNOSTIC STUDIES:  Oxygen Saturation is 100% on RA, normal by my interpretation.    COORDINATION OF CARE:  3:24 PM Advised pt to soak area and to follow up with PCP. Discussed treatment plan with pt at bedside and pt agreed to plan.  Labs Review Labs Reviewed - No data to display  Imaging Review No results found.   EKG Interpretation None      MDM   Final diagnoses:  Furuncles    Furuncles without abscess.  No evidence for systemic infection.  No indication for further evaluation or treatment in the ED setting.  Nursing Notes Reviewed/ Care Coordinated Applicable Imaging Reviewed Interpretation of Laboratory Data incorporated into ED treatment  The patient appears reasonably screened and/or stabilized for discharge and I doubt any other medical condition or other [facility] requiring further screening, evaluation, or treatment in the ED at this time prior to discharge.  Plan: Home Medications- Keflex, Septra; Home Treatments- warm soaks, soap, rest; return here if the recommended treatment, does not improve the symptoms; Recommended follow up- PCP prn  I personally performed the services described in this documentation, which was scribed in my presence. The recorded information has been reviewed and is accurate.       Flint MelterElliott L Iban Utz, MD 12/07/14 [phone]

## 2014-12-07 NOTE — ED Notes (Signed)
PT c/o abscess to left groin x7 days with hx of abscess to same area. PT denies any fevers.

## 2015-01-01 ENCOUNTER — Encounter (HOSPITAL_COMMUNITY): Payer: Self-pay | Admitting: Emergency Medicine

## 2015-01-01 ENCOUNTER — Emergency Department (HOSPITAL_COMMUNITY)
Admission: EM | Admit: 2015-01-01 | Discharge: 2015-01-01 | Disposition: A | Discharge status: Home / Self Care | Payer: Self-pay | Attending: Emergency Medicine | Admitting: Emergency Medicine

## 2015-01-01 DIAGNOSIS — I10 Essential (primary) hypertension: Secondary | ICD-10-CM | POA: Insufficient documentation

## 2015-01-01 DIAGNOSIS — E119 Type 2 diabetes mellitus without complications: Secondary | ICD-10-CM | POA: Insufficient documentation

## 2015-01-01 DIAGNOSIS — K219 Gastro-esophageal reflux disease without esophagitis: Secondary | ICD-10-CM | POA: Insufficient documentation

## 2015-01-01 DIAGNOSIS — Z79899 Other long term (current) drug therapy: Secondary | ICD-10-CM | POA: Insufficient documentation

## 2015-01-01 DIAGNOSIS — Z720 Tobacco use: Secondary | ICD-10-CM | POA: Insufficient documentation

## 2015-01-01 DIAGNOSIS — L02214 Cutaneous abscess of groin: Secondary | ICD-10-CM | POA: Insufficient documentation

## 2015-01-01 DIAGNOSIS — J45909 Unspecified asthma, uncomplicated: Secondary | ICD-10-CM | POA: Insufficient documentation

## 2015-01-01 DIAGNOSIS — L739 Follicular disorder, unspecified: Secondary | ICD-10-CM | POA: Insufficient documentation

## 2015-01-01 MED ORDER — TRAMADOL HCL 50 MG PO TABS: 50.0000 mg | ORAL_TABLET | Freq: Four times a day (QID) | ORAL | Status: DC | PRN

## 2015-01-01 MED ORDER — HYDROCODONE-ACETAMINOPHEN 5-325 MG PO TABS: 1.0000 | ORAL_TABLET | ORAL | Status: DC | PRN

## 2015-01-01 MED ORDER — LIDOCAINE HCL (PF) 2 % IJ SOLN
10.0000 mL | Freq: Once | INTRAMUSCULAR | Status: AC
  Administered 2015-01-01: 10 mL

## 2015-01-01 NOTE — Discharge Instructions (Signed)
Continue to take your antibiotics and apply warm wet compresses to the area several times a day. Follow up with your doctor to discuss recurrent boils. Do not drive while taking the narcotic pain medication as it will make you sleepy.

## 2015-01-01 NOTE — ED Notes (Signed)
Pt reports abscess to L groin area, onset 2/6. Pt seen here for same, has not cleared up.

## 2015-01-01 NOTE — ED Provider Notes (Signed)
CSN: [id number]     Arrival date & time 01/01/15  1646 History   First MD Initiated Contact with Patient 01/01/15 1741     Chief Complaint  Patient presents with  . Abscess     (Consider location/radiation/quality/duration/timing/severity/associated sxs/prior Treatment) Patient is a 44 y.o. female presenting with abscess. The history is provided by the patient.  Abscess Location:  Ano-genital Ano-genital abscess location:  Groin Abscess quality: fluctuance, painful and warmth   Red streaking: no   Duration:  1 month Progression:  Partially resolved Pain details:    Quality:  Throbbing   Severity:  Moderate  Katrina Good is a 44 y.o. female who presents to the ED with tender swollen areas to the mons pubis and left inguinal area. She has a hx of recurrent boils. She has been treated with antibiotics by mouth and ointment. She is currently taking Septra and Keflex. The areas have gotten larger and she is requesting I&D. She denies any other problems today.   Past Medical History  Diagnosis Date  . Hypertension   . Diabetes mellitus   . Substance abuse     pt denies  . Suicidal thoughts     pt denies  . Asthma   . Bronchitis   . Acid reflux    Past Surgical History  Procedure Laterality Date  . Abdominal hysterectomy    . Cesarean section      x 2   Family History  Problem Relation Age of Onset  . Diabetes Other   . Heart disease    . Arthritis    . Lung disease    . Cancer     History  Substance Use Topics  . Smoking status: Current Every Day Smoker -- 0.50 packs/day for 25 years    Types: Cigarettes  . Smokeless tobacco: Never Used  . Alcohol Use: No   OB History    Gravida Para Term Preterm AB TAB SAB Ectopic Multiple Living   5 2 2  3 1 2   2      Review of Systems Negative except as stated in HPI   Allergies  Doxycycline  Home Medications   Prior to Admission medications   Medication Sig Start Date End Date Taking? Authorizing Provider   albuterol (PROVENTIL HFA;VENTOLIN HFA) 108 (90 BASE) MCG/ACT inhaler Inhale 2 puffs into the lungs every 6 (six) hours as needed for wheezing.   Yes Historical Provider, MD  amLODipine (NORVASC) 5 MG tablet Take 5 mg by mouth daily.   Yes Historical Provider, MD  glipiZIDE (GLUCOTROL XL) 2.5 MG 24 hr tablet Take 2.5 mg by mouth daily.   Yes Historical Provider, MD  ibuprofen (ADVIL,MOTRIN) 200 MG tablet Take 200 mg by mouth daily as needed for moderate pain.   Yes Historical Provider, MD  cephALEXin (KEFLEX) 500 MG capsule Take 1 capsule (500 mg total) by mouth 4 (four) times daily. Patient not taking: Reported on 01/01/2015 12/07/14   Flint MelterElliott L Wentz, MD  diphenhydrAMINE (BENADRYL) 25 MG tablet Take 1 tablet (25 mg total) by mouth every 4 (four) hours as needed for itching. Patient not taking: Reported on 01/01/2015 09/08/14   Tammy L. Triplett, PA-C  HYDROcodone-acetaminophen (NORCO/VICODIN) 5-325 MG per tablet Take 1 tablet by mouth every 4 (four) hours as needed for severe pain. 01/01/15   Hope Orlene OchM Neese, NP  ranitidine (ZANTAC) 150 MG capsule Take 1 capsule (150 mg total) by mouth 2 (two) times daily. Patient not taking: Reported on 01/01/2015 01/28/13  Benny Lennert, MD   BP 125/74 mmHg  Pulse 92  Temp(Src) 98.7 F (37.1 C) (Oral)  Resp 18  Ht  (1.753 m)  Wt 219 lb 4.8 oz (99.474 kg)  BMI 32.37 kg/m2  SpO2 100% Physical Exam  Constitutional: She is oriented to person, place, and time. She appears well-developed and well-nourished.  HENT:  Head: Normocephalic.  Eyes: EOM are normal.  Neck: Neck supple.  Cardiovascular: Normal rate.   Pulmonary/Chest: Effort normal.  Genitourinary:  There are several small infected hair follicles noted in the mons pubis area. There is one area to the left inguinal area that is raised and tender.   Musculoskeletal: Normal range of motion.  Neurological: She is alert and oriented to person, place, and time. No cranial nerve deficit.  Skin: Skin is warm  and dry.  Psychiatric: She has a normal mood and affect. Her behavior is normal.  Nursing note and vitals reviewed.   ED Course  Procedures (including critical care time) INCISION AND DRAINAGE Performed by: NEESE,HOPE Consent: Verbal consent obtained. Risks and benefits: risks, benefits and alternatives were discussed Type: abscess  Body area: left inguinal  Anesthesia: local infiltration  Local anesthetic: lidocaine 2% without epinephrine  Anesthetic total: 3 ml  Incision: Straight, single with #11 blade  Complexity: complex Blunt dissection to break up loculations  Drainage: purulent  Drainage amount: small  Packing material: none  Patient tolerance: Patient tolerated the procedure well with no immediate complications.    Labs Review  MDM  44 y.o. female with hx of recurrent abscesses here for I&D. Stable for d/c without fever and does not appear toxic. She will continue her antibiotics and follow up with her PCP. She will return here as needed. Discussed with the patient and all questioned fully answered.    Final diagnoses:  Inguinal abscess      Janne Napoleon, NP 01/01/15 1902  Rolland Porter, MD 01/04/15 [phone]

## 2015-02-12 ENCOUNTER — Encounter (HOSPITAL_COMMUNITY): Payer: Self-pay

## 2015-02-12 ENCOUNTER — Emergency Department (HOSPITAL_COMMUNITY)
Admission: EM | Admit: 2015-02-12 | Discharge: 2015-02-12 | Disposition: A | Discharge status: Home / Self Care | Payer: Self-pay | Attending: Emergency Medicine | Admitting: Emergency Medicine

## 2015-02-12 DIAGNOSIS — Z792 Long term (current) use of antibiotics: Secondary | ICD-10-CM | POA: Insufficient documentation

## 2015-02-12 DIAGNOSIS — I10 Essential (primary) hypertension: Secondary | ICD-10-CM | POA: Insufficient documentation

## 2015-02-12 DIAGNOSIS — K088 Other specified disorders of teeth and supporting structures: Secondary | ICD-10-CM | POA: Insufficient documentation

## 2015-02-12 DIAGNOSIS — J45909 Unspecified asthma, uncomplicated: Secondary | ICD-10-CM | POA: Insufficient documentation

## 2015-02-12 DIAGNOSIS — Z79899 Other long term (current) drug therapy: Secondary | ICD-10-CM | POA: Insufficient documentation

## 2015-02-12 DIAGNOSIS — K219 Gastro-esophageal reflux disease without esophagitis: Secondary | ICD-10-CM | POA: Insufficient documentation

## 2015-02-12 DIAGNOSIS — Z720 Tobacco use: Secondary | ICD-10-CM | POA: Insufficient documentation

## 2015-02-12 DIAGNOSIS — K0889 Other specified disorders of teeth and supporting structures: Secondary | ICD-10-CM

## 2015-02-12 DIAGNOSIS — E119 Type 2 diabetes mellitus without complications: Secondary | ICD-10-CM | POA: Insufficient documentation

## 2015-02-12 MED ORDER — KETOROLAC TROMETHAMINE 60 MG/2ML IM SOLN
60.0000 mg | Freq: Once | INTRAMUSCULAR | Status: AC
  Administered 2015-02-12: 60 mg via INTRAMUSCULAR
  Filled 2015-02-12: qty 2

## 2015-02-12 NOTE — Discharge Instructions (Signed)

## 2015-02-12 NOTE — ED Provider Notes (Signed)
CSN: [id number]     Arrival date & time 02/12/15  0419 History   First MD Initiated Contact with Patient 02/12/15 [phone]     Chief Complaint  Patient presents with  . Dental Pain     (Consider location/radiation/quality/duration/timing/severity/associated sxs/prior Treatment) HPI Patient with history of chronic dental pain presents with worsening dental pain at the last 3 days. States she has several broken teeth but that these are not acutely broken. She is unable to remember when she broke them. She's not seen a dentist. She's had no oral swelling. She states she is taking Tylenol and ibuprofen at home with little relief. No fever or chills. Past Medical History  Diagnosis Date  . Hypertension   . Diabetes mellitus   . Substance abuse     pt denies  . Suicidal thoughts     pt denies  . Asthma   . Bronchitis   . Acid reflux    Past Surgical History  Procedure Laterality Date  . Abdominal hysterectomy    . Cesarean section      x 2   Family History  Problem Relation Age of Onset  . Diabetes Other   . Heart disease    . Arthritis    . Lung disease    . Cancer     History  Substance Use Topics  . Smoking status: Current Every Day Smoker -- 0.50 packs/day for 25 years    Types: Cigarettes  . Smokeless tobacco: Never Used  . Alcohol Use: No   OB History    Gravida Para Term Preterm AB TAB SAB Ectopic Multiple Living   Review of Systems  Constitutional: Negative for fever and chills.  HENT: Positive for dental problem. Negative for facial swelling.   All other systems reviewed and are negative.     Allergies  Doxycycline  Home Medications   Prior to Admission medications   Medication Sig Start Date End Date Taking? Authorizing Provider  albuterol (PROVENTIL HFA;VENTOLIN HFA) 108 (90 BASE) MCG/ACT inhaler Inhale 2 puffs into the lungs every 6 (six) hours as needed for wheezing.    Historical Provider, MD  amLODipine (NORVASC) 5 MG tablet  Take 5 mg by mouth daily.    Historical Provider, MD  cephALEXin (KEFLEX) 500 MG capsule Take 1 capsule (500 mg total) by mouth 4 (four) times daily. Patient not taking: Reported on 01/01/2015 12/07/14   Mancel Bale, MD  diphenhydrAMINE (BENADRYL) 25 MG tablet Take 1 tablet (25 mg total) by mouth every 4 (four) hours as needed for itching. Patient not taking: Reported on 01/01/2015 09/08/14   Tammi Triplett, PA-C  glipiZIDE (GLUCOTROL XL) 2.5 MG 24 hr tablet Take 2.5 mg by mouth daily.    Historical Provider, MD  HYDROcodone-acetaminophen (NORCO/VICODIN) 5-325 MG per tablet Take 1 tablet by mouth every 4 (four) hours as needed for severe pain. 01/01/15   Hope Orlene Och, NP  ibuprofen (ADVIL,MOTRIN) 200 MG tablet Take 200 mg by mouth daily as needed for moderate pain.    Historical Provider, MD  ranitidine (ZANTAC) 150 MG capsule Take 1 capsule (150 mg total) by mouth 2 (two) times daily. Patient not taking: Reported on 01/01/2015 01/28/13   Bethann Berkshire, MD   BP 174/100 mmHg  Pulse 87  Temp(Src) 98.1 F (36.7 C) (Oral)  Resp 16  SpO2 98% Physical Exam  Constitutional: She is oriented to person, place, and time. She appears  well-developed and well-nourished. No distress.  HENT:  Head: Normocephalic and atraumatic.  Mouth/Throat: Oropharynx is clear and moist.    Floor of the mouth is soft. No evidence of Ludwig angina  Eyes: EOM are normal. Pupils are equal, round, and reactive to light.  Neck: Normal range of motion. Neck supple.  Cardiovascular: Normal rate.   Pulmonary/Chest: Effort normal.  Abdominal: Soft. Bowel sounds are normal.  Musculoskeletal: Normal range of motion. She exhibits no edema or tenderness.  Lymphadenopathy:    She has no cervical adenopathy.  Neurological: She is alert and oriented to person, place, and time.  Skin: Skin is warm and dry. No rash noted. No erythema.  Psychiatric: She has a normal mood and affect. Her behavior is normal.  Nursing note and vitals  reviewed.   ED Course  Procedures (including critical care time) Labs Review Labs Reviewed - No data to display  Imaging Review No results found.   EKG Interpretation None      MDM   Final diagnoses:  Pain, dental    Patient given IM Toradol and dental referral.    Loren Raceravid Isadore Bokhari, MD 02/12/15 [phone]

## 2015-02-12 NOTE — ED Notes (Signed)
I have 2 teeth broken off at the gum one on the upper and one on the lower left side per pt.

## 2015-02-12 NOTE — ED Notes (Signed)
MD at bedside. 

## 2015-02-26 ENCOUNTER — Encounter (HOSPITAL_COMMUNITY): Payer: Self-pay | Admitting: Emergency Medicine

## 2015-02-26 ENCOUNTER — Emergency Department (HOSPITAL_COMMUNITY)
Admission: EM | Admit: 2015-02-26 | Discharge: 2015-02-26 | Disposition: A | Discharge status: Home / Self Care | Payer: Self-pay | Attending: Emergency Medicine | Admitting: Emergency Medicine

## 2015-02-26 DIAGNOSIS — J45909 Unspecified asthma, uncomplicated: Secondary | ICD-10-CM | POA: Insufficient documentation

## 2015-02-26 DIAGNOSIS — E119 Type 2 diabetes mellitus without complications: Secondary | ICD-10-CM | POA: Insufficient documentation

## 2015-02-26 DIAGNOSIS — Z79899 Other long term (current) drug therapy: Secondary | ICD-10-CM | POA: Insufficient documentation

## 2015-02-26 DIAGNOSIS — K219 Gastro-esophageal reflux disease without esophagitis: Secondary | ICD-10-CM | POA: Insufficient documentation

## 2015-02-26 DIAGNOSIS — K029 Dental caries, unspecified: Secondary | ICD-10-CM | POA: Insufficient documentation

## 2015-02-26 DIAGNOSIS — Z720 Tobacco use: Secondary | ICD-10-CM | POA: Insufficient documentation

## 2015-02-26 DIAGNOSIS — I10 Essential (primary) hypertension: Secondary | ICD-10-CM | POA: Insufficient documentation

## 2015-02-26 MED ORDER — AMOXICILLIN 500 MG PO CAPS: 500.0000 mg | ORAL_CAPSULE | Freq: Three times a day (TID) | ORAL | Status: DC

## 2015-02-26 MED ORDER — AMOXICILLIN 250 MG PO CAPS
500.0000 mg | ORAL_CAPSULE | Freq: Once | ORAL | Status: AC
  Administered 2015-02-26: 500 mg via ORAL
  Filled 2015-02-26: qty 2

## 2015-02-26 MED ORDER — NAPROXEN 500 MG PO TABS: 500.0000 mg | ORAL_TABLET | Freq: Two times a day (BID) | ORAL | Status: DC

## 2015-02-26 MED ORDER — HYDROCODONE-ACETAMINOPHEN 5-325 MG PO TABS: 2.0000 | ORAL_TABLET | ORAL | Status: DC | PRN

## 2015-02-26 NOTE — ED Notes (Signed)
Pt c/o dental pain x 2 hrs  

## 2015-02-26 NOTE — Discharge Instructions (Signed)
It is important that you see a dentist as soon as possible.  Dental Caries Dental caries is tooth decay. This decay can cause a hole in teeth (cavity) that can get bigger and deeper over time. HOME CARE  Brush and floss your teeth. Do this at least two times a day.  Use a fluoride toothpaste.  Use a mouth rinse if told by your dentist or doctor.  Eat less sugary and starchy foods. Drink less sugary drinks.  Avoid snacking often on sugary and starchy foods. Avoid sipping often on sugary drinks.  Keep regular checkups and cleanings with your dentist.  Use fluoride supplements if told by your dentist or doctor.  Allow fluoride to be applied to teeth if told by your dentist or doctor. Document Released: 07/27/2008 Document Revised: 03/04/2014 Document Reviewed: 10/20/2012 [facility] Patient Information 2015 [place], [place]. This information is not intended to replace advice given to you by your health care provider. Make sure you discuss any questions you have with your health care provider.  Dental Pain A tooth ache may be caused by cavities (tooth decay). Cavities expose the nerve of the tooth to air and hot or cold temperatures. It may come from an infection or abscess (also called a boil or furuncle) around your tooth. It is also often caused by dental caries (tooth decay). This causes the pain you are having. DIAGNOSIS  Your caregiver can diagnose this problem by exam. TREATMENT   If caused by an infection, it may be treated with medications which kill germs (antibiotics) and pain medications as prescribed by your caregiver. Take medications as directed.  Only take over-the-counter or prescription medicines for pain, discomfort, or fever as directed by your caregiver.  Whether the tooth ache today is caused by infection or dental disease, you should see your dentist as soon as possible for further care. SEEK MEDICAL CARE IF: The exam and treatment you received today has been  provided on an emergency basis only. This is not a substitute for complete medical or dental care. If your problem worsens or new problems (symptoms) appear, and you are unable to meet with your dentist, call or return to this location. SEEK IMMEDIATE MEDICAL CARE IF:   You have a fever.  You develop redness and swelling of your face, jaw, or neck.  You are unable to open your mouth.  You have severe pain uncontrolled by pain medicine. MAKE SURE YOU:   Understand these instructions.  Will watch your condition.  Will get help right away if you are not doing well or get worse. Document Released: 10/18/2005 Document Revised: 01/10/2012 Document Reviewed: 06/05/2008 [facility] Patient Information 2015 [place], [place]. This information is not intended to replace advice given to you by your health care provider. Make sure you discuss any questions you have with your health care provider.

## 2015-02-26 NOTE — ED Provider Notes (Signed)
CSN: [id number]     Arrival date & time 02/26/15  0030 History   First MD Initiated Contact with Patient 02/26/15 0120     Chief Complaint  Patient presents with  . Dental Pain     (Consider location/radiation/quality/duration/timing/severity/associated sxs/prior Treatment) Patient is a 44 y.o. female presenting with tooth pain. The history is provided by the patient.  Dental Pain Location:  Upper and lower Upper teeth location:  15/LU 2nd molar Lower teeth location:  17/LL 3rd molar Quality:  Throbbing Severity:  Severe Onset quality:  Gradual Duration:  2 hours Timing:  Constant Progression:  Worsening Chronicity:  Recurrent Context: dental caries   Relieved by:  Nothing Worsened by:  Cold food/drink, touching and jaw movement Ineffective treatments:  NSAIDs Associated symptoms: facial pain   Risk factors: smoking    Katrina Good is a 44 y.o. female who presents to the ED with dental pain. She states that the pain woke her tonight. She took tylenol and ibuprofen without relief. She has had dental problems but states she can not get a Education officer, community for lack of money and insurance. She denies fever or other problems.   Past Medical History  Diagnosis Date  . Hypertension   . Diabetes mellitus   . Substance abuse     pt denies  . Suicidal thoughts     pt denies  . Asthma   . Bronchitis   . Acid reflux    Past Surgical History  Procedure Laterality Date  . Abdominal hysterectomy    . Cesarean section      x 2   Family History  Problem Relation Age of Onset  . Diabetes Other   . Heart disease    . Arthritis    . Lung disease    . Cancer     History  Substance Use Topics  . Smoking status: Current Every Day Smoker -- 0.50 packs/day for 25 years    Types: Cigarettes  . Smokeless tobacco: Never Used  . Alcohol Use: No   OB History    Gravida Para Term Preterm AB TAB SAB Ectopic Multiple Living   Review of Systems  HENT: Positive for  dental problem.   all other systems negative    Allergies  Doxycycline  Home Medications   Prior to Admission medications   Medication Sig Start Date End Date Taking? Authorizing Provider  albuterol (PROVENTIL HFA;VENTOLIN HFA) 108 (90 BASE) MCG/ACT inhaler Inhale 2 puffs into the lungs every 6 (six) hours as needed for wheezing.   Yes Historical Provider, MD  amLODipine (NORVASC) 5 MG tablet Take 5 mg by mouth daily.   Yes Historical Provider, MD  diphenhydrAMINE (BENADRYL) 25 MG tablet Take 1 tablet (25 mg total) by mouth every 4 (four) hours as needed for itching. 09/08/14  Yes Tammy Triplett, PA-C  glipiZIDE (GLUCOTROL XL) 2.5 MG 24 hr tablet Take 2.5 mg by mouth daily.   Yes Historical Provider, MD  ranitidine (ZANTAC) 150 MG capsule Take 1 capsule (150 mg total) by mouth 2 (two) times daily. 01/28/13  Yes Bethann Berkshire, MD  amoxicillin (AMOXIL) 500 MG capsule Take 1 capsule (500 mg total) by mouth 3 (three) times daily. 02/26/15   Hope Orlene Och, NP  HYDROcodone-acetaminophen (NORCO/VICODIN) 5-325 MG per tablet Take 2 tablets by mouth every 4 (four) hours as needed. 02/26/15   Hope Orlene Och, NP  naproxen (NAPROSYN) 500 MG tablet  Take 1 tablet (500 mg total) by mouth 2 (two) times daily. 02/26/15   Hope Orlene OchM Neese, NP   BP 146/94 mmHg  Pulse 83  Temp(Src) 98.7 F (37.1 C)  Resp 18  Ht 5\' 9"  (1.753 m)  Wt 220 lb (99.791 kg)  BMI 32.47 kg/m2  SpO2 95% Physical Exam  Constitutional: She is oriented to person, place, and time. She appears well-developed and well-nourished. No distress.  HENT:  Head: Normocephalic.  Right Ear: Tympanic membrane normal.  Left Ear: Tympanic membrane normal.  Nose: Nose normal.  Mouth/Throat: Uvula is midline.    Decay to upper second molar and lower left third molar. Gum swelling around the teeth. Tender on exam.   Eyes: EOM are normal.  Neck: Neck supple.  Cardiovascular: Normal rate.   Pulmonary/Chest: Effort normal.  Musculoskeletal: Normal range  of motion.  Lymphadenopathy:    She has no cervical adenopathy.  Neurological: She is alert and oriented to person, place, and time. No cranial nerve deficit.  Skin: Skin is warm and dry.  Psychiatric: She has a normal mood and affect. Her behavior is normal.  Nursing note and vitals reviewed.   ED Course  Procedures  Amoxicillin 500 mg. Norco pre pack, dental referral MDM  44 y.o. female with dental pain due to caries. Stable for d/c without fever and does not appear toxic. Encouraged patient to follow up with a dentist as soon as possible. She agrees with plan.   Final diagnoses:  Pain due to dental caries       Janne NapoleonHope M Neese, NP 02/27/15 [id number]  Azalia BilisKevin Campos, MD 02/27/15 1640

## 2015-03-11 MED FILL — Hydrocodone-Acetaminophen Tab 5-325 MG: ORAL | Qty: 6 | Status: AC

## 2015-07-24 ENCOUNTER — Emergency Department (HOSPITAL_COMMUNITY)
Admission: EM | Admit: 2015-07-24 | Discharge: 2015-07-24 | Disposition: A | Discharge status: Home / Self Care | Payer: Self-pay | Attending: Emergency Medicine | Admitting: Emergency Medicine

## 2015-07-24 ENCOUNTER — Encounter (HOSPITAL_COMMUNITY): Payer: Self-pay | Admitting: *Deleted

## 2015-07-24 DIAGNOSIS — M545 Low back pain, unspecified: Secondary | ICD-10-CM

## 2015-07-24 DIAGNOSIS — Z8659 Personal history of other mental and behavioral disorders: Secondary | ICD-10-CM | POA: Insufficient documentation

## 2015-07-24 DIAGNOSIS — Z79899 Other long term (current) drug therapy: Secondary | ICD-10-CM | POA: Insufficient documentation

## 2015-07-24 DIAGNOSIS — K219 Gastro-esophageal reflux disease without esophagitis: Secondary | ICD-10-CM | POA: Insufficient documentation

## 2015-07-24 DIAGNOSIS — E119 Type 2 diabetes mellitus without complications: Secondary | ICD-10-CM | POA: Insufficient documentation

## 2015-07-24 DIAGNOSIS — I10 Essential (primary) hypertension: Secondary | ICD-10-CM | POA: Insufficient documentation

## 2015-07-24 DIAGNOSIS — J45909 Unspecified asthma, uncomplicated: Secondary | ICD-10-CM | POA: Insufficient documentation

## 2015-07-24 DIAGNOSIS — Z720 Tobacco use: Secondary | ICD-10-CM | POA: Insufficient documentation

## 2015-07-24 DIAGNOSIS — Z7982 Long term (current) use of aspirin: Secondary | ICD-10-CM | POA: Insufficient documentation

## 2015-07-24 LAB — URINALYSIS, ROUTINE W REFLEX MICROSCOPIC
Bilirubin Urine: NEGATIVE
Glucose, UA: NEGATIVE mg/dL
Hgb urine dipstick: NEGATIVE
KETONES UR: NEGATIVE mg/dL
LEUKOCYTES UA: NEGATIVE
Nitrite: NEGATIVE
PROTEIN: NEGATIVE mg/dL
Specific Gravity, Urine: 1.015 (ref 1.005–1.030)
Urobilinogen, UA: 0.2 mg/dL (ref 0.0–1.0)
pH: 6.5 (ref 5.0–8.0)

## 2015-07-24 LAB — CBG MONITORING, ED: Glucose-Capillary: 131 mg/dL — ABNORMAL HIGH (ref 65–99)

## 2015-07-24 LAB — POC URINE PREG, ED: PREG TEST UR: NEGATIVE

## 2015-07-24 MED ORDER — HYDROCODONE-ACETAMINOPHEN 5-325 MG PO TABS: ORAL_TABLET | ORAL | Status: DC

## 2015-07-24 MED ORDER — CYCLOBENZAPRINE HCL 10 MG PO TABS: 10.0000 mg | ORAL_TABLET | Freq: Three times a day (TID) | ORAL | Status: DC | PRN

## 2015-07-24 MED ORDER — CYCLOBENZAPRINE HCL 10 MG PO TABS
10.0000 mg | ORAL_TABLET | Freq: Once | ORAL | Status: AC
  Administered 2015-07-24: 10 mg via ORAL
  Filled 2015-07-24: qty 1

## 2015-07-24 MED ORDER — HYDROCODONE-ACETAMINOPHEN 5-325 MG PO TABS
1.0000 | ORAL_TABLET | Freq: Once | ORAL | Status: AC
  Administered 2015-07-24: 1 via ORAL
  Filled 2015-07-24: qty 1

## 2015-07-24 NOTE — Discharge Instructions (Signed)

## 2015-07-24 NOTE — ED Notes (Signed)
Pt comes in with lower back pain that is worse with movement. Pt denies any urinary or GI symptoms. NAD noted.

## 2015-07-26 NOTE — ED Provider Notes (Signed)
CSN: [id number]     Arrival date & time 07/24/15  1133 History   First MD Initiated Contact with Patient 07/24/15 1223     Chief Complaint  Patient presents with  . Back Pain     (Consider location/radiation/quality/duration/timing/severity/associated sxs/prior Treatment) HPI   Katrina Good is a 44 y.o. female who presents to the Emergency Department complaining of low back pain for several days.  She describes an aching pain to her back that is worse with movement, especially bending and twisting.  Pain improves with rest.  She states pain feels similar to previous back pain.  She has not taken any medications for her symptoms.  She denies known injury, abd pain, numbness or weakness to her lower extremities, urine or bowel incontinence or retention, dysuria, or fever   Past Medical History  Diagnosis Date  . Hypertension   . Diabetes mellitus   . Substance abuse     pt denies  . Suicidal thoughts     pt denies  . Asthma   . Bronchitis   . Acid reflux    Past Surgical History  Procedure Laterality Date  . Abdominal hysterectomy    . Cesarean section      x 2   Family History  Problem Relation Age of Onset  . Diabetes Other   . Heart disease    . Arthritis    . Lung disease    . Cancer     Social History  Substance Use Topics  . Smoking status: Current Every Day Smoker -- 0.50 packs/day for 25 years    Types: Cigarettes  . Smokeless tobacco: Never Used  . Alcohol Use: No   OB History    Gravida Para Term Preterm AB TAB SAB Ectopic Multiple Living   Review of Systems  Constitutional: Negative for fever.  Respiratory: Negative for shortness of breath.   Gastrointestinal: Negative for vomiting, abdominal pain and constipation.  Genitourinary: Negative for dysuria, hematuria, flank pain, decreased urine volume and difficulty urinating.  Musculoskeletal: Positive for back pain. Negative for joint swelling.  Skin: Negative for rash.   Neurological: Negative for weakness and numbness.  All other systems reviewed and are negative.     Allergies  Doxycycline  Home Medications   Prior to Admission medications   Medication Sig Start Date End Date Taking? Authorizing Jamerica Snavely  albuterol (PROVENTIL HFA;VENTOLIN HFA) 108 (90 BASE) MCG/ACT inhaler Inhale 2 puffs into the lungs every 6 (six) hours as needed for wheezing.   Yes Historical Maleta Pacha, MD  amLODipine (NORVASC) 5 MG tablet Take 5 mg by mouth daily.   Yes Historical Adellyn Capek, MD  aspirin EC 81 MG tablet Take 81 mg by mouth daily.   Yes Historical Davonne Baby, MD  glipiZIDE (GLUCOTROL XL) 2.5 MG 24 hr tablet Take 2.5 mg by mouth daily.   Yes Historical Mitesh Rosendahl, MD  ibuprofen (ADVIL,MOTRIN) 200 MG tablet Take 800 mg by mouth every 6 (six) hours as needed.   Yes Historical Randie Tallarico, MD  ranitidine (ZANTAC) 150 MG capsule Take 1 capsule (150 mg total) by mouth 2 (two) times daily. 01/28/13  Yes Bethann Berkshire, MD  cyclobenzaprine (FLEXERIL) 10 MG tablet Take 1 tablet (10 mg total) by mouth 3 (three) times daily as needed. 07/24/15   Tammy Triplett, PA-C  HYDROcodone-acetaminophen (NORCO/VICODIN) 5-325 MG per tablet Take one-two tabs po q 4-6 hrs prn pain 07/24/15   Pauline Aus, PA-C  BP 169/89 mmHg  Pulse 77  Temp(Src) 97.5 F (36.4 C) (Oral)  Resp 20  Ht  (1.753 m)  Wt 220 lb (99.791 kg)  BMI 32.47 kg/m2  SpO2 100% Physical Exam  Constitutional: She is oriented to person, place, and time. She appears well-developed and well-nourished. No distress.  HENT:  Head: Normocephalic and atraumatic.  Neck: Normal range of motion. Neck supple.  Cardiovascular: Normal rate, regular rhythm, normal heart sounds and intact distal pulses.   No murmur heard. Pulmonary/Chest: Effort normal and breath sounds normal. No respiratory distress.  Abdominal: Soft. She exhibits no distension. There is no tenderness. There is no rebound and no guarding.  Musculoskeletal: She  exhibits tenderness. She exhibits no edema.       Lumbar back: She exhibits tenderness and pain. She exhibits normal range of motion, no swelling, no deformity, no laceration and normal pulse.  Diffuse ttp of the bilateral lumbar paraspinal muscles.  No spinal tenderness.  DP pulses are brisk and symmetrical.  Distal sensation intact.  Hip Flexors/Extensors are intact.  Pt has 5/5 strength against resistance of bilateral lower extremities.     Neurological: She is alert and oriented to person, place, and time. She has normal strength. No sensory deficit. She exhibits normal muscle tone. Coordination and gait normal.  Reflex Scores:      Patellar reflexes are 2+ on the right side and 2+ on the left side.      Achilles reflexes are 2+ on the right side and 2+ on the left side. Skin: Skin is warm and dry. No rash noted.  Nursing note and vitals reviewed.   ED Course  Procedures (including critical care time) Labs Review Labs Reviewed  URINALYSIS, ROUTINE W REFLEX MICROSCOPIC (NOT AT [facility]) - Abnormal; Notable for the following:    APPearance HAZY (*)    All other components within normal limits  CBG MONITORING, ED - Abnormal; Notable for the following:    Glucose-Capillary 131 (*)    All other components within normal limits  POC URINE PREG, ED    Imaging Review No results found. I have personally reviewed and evaluated these images and lab results as part of my medical decision-making.   EKG Interpretation None      MDM   Final diagnoses:  Bilateral low back pain without sciatica   Pt is well appearing, non-toxic.  Ambulates with a steady gait.  No focal neuro deficits.  No concerning sx's for emergent neurological or infectious process.  No hx of trauma to indicate need for imaging    Pauline Aus, PA-C 07/26/15 2307  Nelva Nay, MD 08/01/15 [phone]

## 2015-10-02 ENCOUNTER — Emergency Department (HOSPITAL_COMMUNITY): Payer: Self-pay

## 2015-10-02 ENCOUNTER — Emergency Department (HOSPITAL_COMMUNITY)
Admission: EM | Admit: 2015-10-02 | Discharge: 2015-10-02 | Discharge status: Against Medical Advice | Payer: Self-pay | Attending: Emergency Medicine | Admitting: Emergency Medicine

## 2015-10-02 ENCOUNTER — Encounter (HOSPITAL_COMMUNITY): Payer: Self-pay | Admitting: Emergency Medicine

## 2015-10-02 DIAGNOSIS — F17210 Nicotine dependence, cigarettes, uncomplicated: Secondary | ICD-10-CM | POA: Insufficient documentation

## 2015-10-02 DIAGNOSIS — E119 Type 2 diabetes mellitus without complications: Secondary | ICD-10-CM | POA: Insufficient documentation

## 2015-10-02 DIAGNOSIS — Y998 Other external cause status: Secondary | ICD-10-CM | POA: Insufficient documentation

## 2015-10-02 DIAGNOSIS — J45909 Unspecified asthma, uncomplicated: Secondary | ICD-10-CM | POA: Insufficient documentation

## 2015-10-02 DIAGNOSIS — I10 Essential (primary) hypertension: Secondary | ICD-10-CM | POA: Insufficient documentation

## 2015-10-02 DIAGNOSIS — Y9389 Activity, other specified: Secondary | ICD-10-CM | POA: Insufficient documentation

## 2015-10-02 DIAGNOSIS — Y9289 Other specified places as the place of occurrence of the external cause: Secondary | ICD-10-CM | POA: Insufficient documentation

## 2015-10-02 DIAGNOSIS — S6991XA Unspecified injury of right wrist, hand and finger(s), initial encounter: Secondary | ICD-10-CM | POA: Insufficient documentation

## 2015-10-02 DIAGNOSIS — W228XXA Striking against or struck by other objects, initial encounter: Secondary | ICD-10-CM | POA: Insufficient documentation

## 2015-10-02 NOTE — ED Notes (Signed)
PT stated she could no longer wait and wanted to sign herself out of ED.

## 2015-10-02 NOTE — ED Notes (Signed)
PT states she punched a car window yesterday evening and c/o pain and swelling to right hand.

## 2015-11-14 ENCOUNTER — Emergency Department (HOSPITAL_COMMUNITY)
Admission: EM | Admit: 2015-11-14 | Discharge: 2015-11-14 | Disposition: A | Discharge status: Home / Self Care | Payer: Self-pay | Attending: Emergency Medicine | Admitting: Emergency Medicine

## 2015-11-14 ENCOUNTER — Encounter (HOSPITAL_COMMUNITY): Payer: Self-pay | Admitting: *Deleted

## 2015-11-14 DIAGNOSIS — Y9289 Other specified places as the place of occurrence of the external cause: Secondary | ICD-10-CM | POA: Insufficient documentation

## 2015-11-14 DIAGNOSIS — F17210 Nicotine dependence, cigarettes, uncomplicated: Secondary | ICD-10-CM | POA: Insufficient documentation

## 2015-11-14 DIAGNOSIS — Z7982 Long term (current) use of aspirin: Secondary | ICD-10-CM | POA: Insufficient documentation

## 2015-11-14 DIAGNOSIS — I10 Essential (primary) hypertension: Secondary | ICD-10-CM | POA: Insufficient documentation

## 2015-11-14 DIAGNOSIS — Y9389 Activity, other specified: Secondary | ICD-10-CM | POA: Insufficient documentation

## 2015-11-14 DIAGNOSIS — R197 Diarrhea, unspecified: Secondary | ICD-10-CM | POA: Insufficient documentation

## 2015-11-14 DIAGNOSIS — S8991XA Unspecified injury of right lower leg, initial encounter: Secondary | ICD-10-CM | POA: Insufficient documentation

## 2015-11-14 DIAGNOSIS — Y998 Other external cause status: Secondary | ICD-10-CM | POA: Insufficient documentation

## 2015-11-14 DIAGNOSIS — W1839XA Other fall on same level, initial encounter: Secondary | ICD-10-CM | POA: Insufficient documentation

## 2015-11-14 DIAGNOSIS — J40 Bronchitis, not specified as acute or chronic: Secondary | ICD-10-CM

## 2015-11-14 DIAGNOSIS — R109 Unspecified abdominal pain: Secondary | ICD-10-CM | POA: Insufficient documentation

## 2015-11-14 DIAGNOSIS — E119 Type 2 diabetes mellitus without complications: Secondary | ICD-10-CM | POA: Insufficient documentation

## 2015-11-14 DIAGNOSIS — Z79899 Other long term (current) drug therapy: Secondary | ICD-10-CM | POA: Insufficient documentation

## 2015-11-14 DIAGNOSIS — J45909 Unspecified asthma, uncomplicated: Secondary | ICD-10-CM | POA: Insufficient documentation

## 2015-11-14 DIAGNOSIS — K219 Gastro-esophageal reflux disease without esophagitis: Secondary | ICD-10-CM | POA: Insufficient documentation

## 2015-11-14 MED ORDER — BENZONATATE 100 MG PO CAPS
200.0000 mg | ORAL_CAPSULE | Freq: Once | ORAL | Status: AC
  Administered 2015-11-14: 200 mg via ORAL
  Filled 2015-11-14: qty 2

## 2015-11-14 MED ORDER — NAPROXEN 500 MG PO TABS: 500.0000 mg | ORAL_TABLET | Freq: Two times a day (BID) | ORAL | Status: DC

## 2015-11-14 MED ORDER — BENZONATATE 100 MG PO CAPS: 100.0000 mg | ORAL_CAPSULE | Freq: Three times a day (TID) | ORAL | Status: DC

## 2015-11-14 MED ORDER — AZITHROMYCIN 250 MG PO TABS: 250.0000 mg | ORAL_TABLET | Freq: Every day | ORAL | Status: DC

## 2015-11-14 NOTE — ED Notes (Signed)
Pt is here with her mother and sister who are also being seen for same symptoms and all live together. Pt states symptoms started yesterday and states productive cough, green in color, generalized abd pain, Vomited x 4 yesterday, diarrhea as well. Last vomited yesterday. NAD. VSS.

## 2015-11-14 NOTE — ED Notes (Signed)
Pt requesting "Hydrocodone" prescription upon discharge. MD notified.

## 2015-11-14 NOTE — Discharge Instructions (Signed)
Inhaler 2 puffs every 4 hours for shortness of breath or coughing Tessalon cough medication every 8 hours as needed Zithromax daily for 5 days Drink plenty of fluids to stay hydrated  Please obtain all of your results from medical records or have your doctors office obtain the results - share them with your doctor - you should be seen at your doctors office in the next 2 days. Call today to arrange your follow up. Take the medications as prescribed. Please review all of the medicines and only take them if you do not have an allergy to them. Please be aware that if you are taking birth control pills, taking other prescriptions, ESPECIALLY ANTIBIOTICS may make the birth control ineffective - if this is the case, either do not engage in sexual activity or use alternative methods of birth control such as condoms until you have finished the medicine and your family doctor says it is OK to restart them. If you are on a blood thinner such as COUMADIN, be aware that any other medicine that you take may cause the coumadin to either work too much, or not enough - you should have your coumadin level rechecked in next 7 days if this is the case.  ?  It is also a possibility that you have an allergic reaction to any of the medicines that you have been prescribed - Everybody reacts differently to medications and while MOST people have no trouble with most medicines, you may have a reaction such as nausea, vomiting, rash, swelling, shortness of breath. If this is the case, please stop taking the medicine immediately and contact your physician.  ?  You should return to the ER if you develop severe or worsening symptoms.

## 2015-11-14 NOTE — ED Notes (Signed)
Prescription given for Naproxen prior to discharge. Reviewed with pt. Pt refused prescription and left without it. Prescription torn up and placed in shred box. MD notified.

## 2015-11-14 NOTE — ED Provider Notes (Signed)
CSN: [id number]     Arrival date & time 11/14/15  1191 History  By signing my name below, I, Gonzella Lex, attest that this documentation has been prepared under the direction and in the presence of Eber Hong, MD. Electronically Signed: Gonzella Lex, Scribe. 11/14/2015. 11:51 AM.   Chief Complaint  Patient presents with  . Cough  . Abdominal Pain   Patient is a 45 y.o. female presenting with abdominal pain. The history is provided by the patient. No language interpreter was used.  Abdominal Pain Associated symptoms: cough and diarrhea     HPI Comments: ZALEA PETE is a 45 y.o. female with a hx of DM for which she takes pills, who presents to the Emergency Department complaining of onset of a cough, abdominal pain, and watery diarrhea one day ago after other family members also suffered similar symptoms. Pt has had one bowel movement this morning which was loose. She also reports that she has an inhaler at home which she occasionally uses. She denies blood in her stool. Pt has a hx of smoking.   Pt also reports constant, mild, right posterior lower extremity pain which began after she fell three weeks ago and twisted her right leg.   Past Medical History  Diagnosis Date  . Hypertension   . Diabetes mellitus   . Substance abuse     pt denies  . Suicidal thoughts     pt denies  . Asthma   . Bronchitis   . Acid reflux    Past Surgical History  Procedure Laterality Date  . Abdominal hysterectomy    . Cesarean section      x 2   Family History  Problem Relation Age of Onset  . Diabetes Other   . Heart disease    . Arthritis    . Lung disease    . Cancer     Social History  Substance Use Topics  . Smoking status: Current Every Day Smoker -- 0.50 packs/day for 25 years    Types: Cigarettes  . Smokeless tobacco: Never Used  . Alcohol Use: No   OB History    Gravida Para Term Preterm AB TAB SAB Ectopic Multiple Living   5 2 2  3 1 2   2      Review  of Systems  Respiratory: Positive for cough.   Gastrointestinal: Positive for abdominal pain and diarrhea. Negative for blood in stool.  Musculoskeletal: Positive for myalgias and arthralgias.       Right leg  All other systems reviewed and are negative.  Allergies  Doxycycline  Home Medications   Prior to Admission medications   Medication Sig Start Date End Date Taking? Authorizing Provider  albuterol (PROVENTIL HFA;VENTOLIN HFA) 108 (90 BASE) MCG/ACT inhaler Inhale 2 puffs into the lungs every 6 (six) hours as needed for wheezing.    Historical Provider, MD  amLODipine (NORVASC) 5 MG tablet Take 5 mg by mouth daily.    Historical Provider, MD  aspirin EC 81 MG tablet Take 81 mg by mouth daily.    Historical Provider, MD  azithromycin (ZITHROMAX Z-PAK) 250 MG tablet Take 1 tablet (250 mg total) by mouth daily. 500mg  PO day 1, then 250mg  PO days 205 11/14/15   Eber Hong, MD  benzonatate (TESSALON) 100 MG capsule Take 1 capsule (100 mg total) by mouth every 8 (eight) hours. 11/14/15   Eber Hong, MD  cyclobenzaprine (FLEXERIL) 10 MG tablet Take 1 tablet (10 mg total) by  mouth 3 (three) times daily as needed. Patient not taking: Reported on 10/02/2015 07/24/15   Tammy Triplett, PA-C  glipiZIDE (GLUCOTROL XL) 2.5 MG 24 hr tablet Take 2.5 mg by mouth daily.    Historical Provider, MD  HYDROcodone-acetaminophen (NORCO/VICODIN) 5-325 MG per tablet Take one-two tabs po q 4-6 hrs prn pain Patient not taking: Reported on 10/02/2015 07/24/15   Tammy Triplett, PA-C  naproxen (NAPROSYN) 500 MG tablet Take 1 tablet (500 mg total) by mouth 2 (two) times daily with a meal. 11/14/15   Eber HongBrian Carle Dargan, MD  ranitidine (ZANTAC) 150 MG capsule Take 1 capsule (150 mg total) by mouth 2 (two) times daily. 01/28/13   Bethann BerkshireJoseph Zammit, MD   BP 135/82 mmHg  Pulse 83  Temp(Src) 98.4 F (36.9 C) (Oral)  Resp 17  Ht 5\' 9"  (1.753 m)  Wt 220 lb (99.791 kg)  BMI 32.47 kg/m2  SpO2 100% Physical Exam  Constitutional: She  appears well-developed and well-nourished. No distress.  HENT:  Head: Normocephalic and atraumatic.  Mouth/Throat: Oropharynx is clear and moist. No oropharyngeal exudate.  Eyes: Conjunctivae and EOM are normal. Pupils are equal, round, and reactive to light. Right eye exhibits no discharge. Left eye exhibits no discharge. No scleral icterus.  Neck: Normal range of motion. Neck supple. No JVD present. No thyromegaly present.  Cardiovascular: Normal rate, regular rhythm, normal heart sounds and intact distal pulses.  Exam reveals no gallop and no friction rub.   No murmur heard. Pulmonary/Chest: Effort normal and breath sounds normal. No respiratory distress. She has no wheezes. She has no rales.  Frequent coughing but speech in full sentences without distress  No wheezing or rales  Abdominal: Soft. Bowel sounds are normal. She exhibits no distension and no mass. There is no tenderness.  Musculoskeletal: Normal range of motion. She exhibits no edema or tenderness.  Lymphadenopathy:    She has no cervical adenopathy.  Neurological: She is alert. Coordination normal.  Skin: Skin is warm and dry. No rash noted. No erythema.  Psychiatric: She has a normal mood and affect. Her behavior is normal.  Nursing note and vitals reviewed.   ED Course  Procedures  DIAGNOSTIC STUDIES:    Oxygen Saturation is 99% on RA, normal by my interpretation.   COORDINATION OF CARE:  11:46 AM Will prescribe pt cough and pain medication. Discussed treatment plan with pt at bedside and pt agreed to plan.   MDM   Final diagnoses:  Bronchitis    The pt was upset that she was not getting vicodin, refused NSAIDs Stable from pulmonary standpoint - VS normal -  Meds given in ED:  Medications  benzonatate (TESSALON) capsule 200 mg (200 mg Oral Given 11/14/15 1154)    Discharge Medication List as of 11/14/2015 11:49 AM    START taking these medications   Details  azithromycin (ZITHROMAX Z-PAK) 250 MG  tablet Take 1 tablet (250 mg total) by mouth daily. 500mg  PO day 1, then 250mg  PO days 205, Starting 11/14/2015, Until Discontinued, Print    benzonatate (TESSALON) 100 MG capsule Take 1 capsule (100 mg total) by mouth every 8 (eight) hours., Starting 11/14/2015, Until Discontinued, Print          I personally performed the services described in this documentation, which was scribed in my presence. The recorded information has been reviewed and is accurate.       Eber HongBrian Jacia Sickman, MD 11/15/15 1220

## 2015-11-22 ENCOUNTER — Emergency Department (HOSPITAL_COMMUNITY): Payer: Self-pay

## 2015-11-22 ENCOUNTER — Encounter (HOSPITAL_COMMUNITY): Payer: Self-pay | Admitting: Emergency Medicine

## 2015-11-22 ENCOUNTER — Emergency Department (HOSPITAL_COMMUNITY)
Admission: EM | Admit: 2015-11-22 | Discharge: 2015-11-22 | Disposition: A | Discharge status: Home / Self Care | Payer: Self-pay | Attending: Emergency Medicine | Admitting: Emergency Medicine

## 2015-11-22 DIAGNOSIS — Z90710 Acquired absence of both cervix and uterus: Secondary | ICD-10-CM | POA: Insufficient documentation

## 2015-11-22 DIAGNOSIS — E119 Type 2 diabetes mellitus without complications: Secondary | ICD-10-CM | POA: Insufficient documentation

## 2015-11-22 DIAGNOSIS — R197 Diarrhea, unspecified: Secondary | ICD-10-CM | POA: Insufficient documentation

## 2015-11-22 DIAGNOSIS — F419 Anxiety disorder, unspecified: Secondary | ICD-10-CM | POA: Insufficient documentation

## 2015-11-22 DIAGNOSIS — F17210 Nicotine dependence, cigarettes, uncomplicated: Secondary | ICD-10-CM | POA: Insufficient documentation

## 2015-11-22 DIAGNOSIS — Z7982 Long term (current) use of aspirin: Secondary | ICD-10-CM | POA: Insufficient documentation

## 2015-11-22 DIAGNOSIS — R112 Nausea with vomiting, unspecified: Secondary | ICD-10-CM | POA: Insufficient documentation

## 2015-11-22 DIAGNOSIS — R109 Unspecified abdominal pain: Secondary | ICD-10-CM

## 2015-11-22 DIAGNOSIS — R630 Anorexia: Secondary | ICD-10-CM | POA: Insufficient documentation

## 2015-11-22 DIAGNOSIS — J45909 Unspecified asthma, uncomplicated: Secondary | ICD-10-CM | POA: Insufficient documentation

## 2015-11-22 DIAGNOSIS — I10 Essential (primary) hypertension: Secondary | ICD-10-CM | POA: Insufficient documentation

## 2015-11-22 DIAGNOSIS — Z79899 Other long term (current) drug therapy: Secondary | ICD-10-CM | POA: Insufficient documentation

## 2015-11-22 DIAGNOSIS — Z792 Long term (current) use of antibiotics: Secondary | ICD-10-CM | POA: Insufficient documentation

## 2015-11-22 DIAGNOSIS — R1033 Periumbilical pain: Secondary | ICD-10-CM | POA: Insufficient documentation

## 2015-11-22 DIAGNOSIS — Z3202 Encounter for pregnancy test, result negative: Secondary | ICD-10-CM | POA: Insufficient documentation

## 2015-11-22 LAB — CBC
HEMATOCRIT: 46.1 % — AB (ref 36.0–46.0)
Hemoglobin: 15.6 g/dL — ABNORMAL HIGH (ref 12.0–15.0)
MCH: 27.3 pg (ref 26.0–34.0)
MCHC: 33.8 g/dL (ref 30.0–36.0)
MCV: 80.6 fL (ref 78.0–100.0)
PLATELETS: 224 10*3/uL (ref 150–400)
RBC: 5.72 MIL/uL — AB (ref 3.87–5.11)
RDW: 15.1 % (ref 11.5–15.5)
WBC: 11.1 10*3/uL — AB (ref 4.0–10.5)

## 2015-11-22 LAB — COMPREHENSIVE METABOLIC PANEL
ALK PHOS: 106 U/L (ref 38–126)
ALT: 14 U/L (ref 14–54)
AST: 17 U/L (ref 15–41)
Albumin: 4 g/dL (ref 3.5–5.0)
Anion gap: 8 (ref 5–15)
BUN: 12 mg/dL (ref 6–20)
CO2: 25 mmol/L (ref 22–32)
CREATININE: 0.84 mg/dL (ref 0.44–1.00)
Calcium: 9 mg/dL (ref 8.9–10.3)
Chloride: 104 mmol/L (ref 101–111)
Glucose, Bld: 85 mg/dL (ref 65–99)
Potassium: 4.1 mmol/L (ref 3.5–5.1)
Sodium: 137 mmol/L (ref 135–145)
Total Bilirubin: 1 mg/dL (ref 0.3–1.2)
Total Protein: 8.1 g/dL (ref 6.5–8.1)

## 2015-11-22 LAB — URINALYSIS, ROUTINE W REFLEX MICROSCOPIC
Bilirubin Urine: NEGATIVE
GLUCOSE, UA: NEGATIVE mg/dL
Hgb urine dipstick: NEGATIVE
Leukocytes, UA: NEGATIVE
Nitrite: NEGATIVE
PH: 7.5 (ref 5.0–8.0)
PROTEIN: NEGATIVE mg/dL
SPECIFIC GRAVITY, URINE: 1.015 (ref 1.005–1.030)

## 2015-11-22 LAB — PREGNANCY, URINE: Preg Test, Ur: NEGATIVE

## 2015-11-22 LAB — LIPASE, BLOOD: Lipase: 29 U/L (ref 11–51)

## 2015-11-22 MED ORDER — SODIUM CHLORIDE 0.9 % IV BOLUS (SEPSIS)
1000.0000 mL | Freq: Once | INTRAVENOUS | Status: AC
  Administered 2015-11-22: 1000 mL via INTRAVENOUS

## 2015-11-22 MED ORDER — ONDANSETRON HCL 4 MG/2ML IJ SOLN
4.0000 mg | Freq: Once | INTRAMUSCULAR | Status: AC
  Administered 2015-11-22: 4 mg via INTRAVENOUS
  Filled 2015-11-22: qty 2

## 2015-11-22 MED ORDER — IOHEXOL 300 MG/ML  SOLN
100.0000 mL | Freq: Once | INTRAMUSCULAR | Status: AC | PRN
  Administered 2015-11-22: 100 mL via INTRAVENOUS

## 2015-11-22 MED ORDER — IOHEXOL 300 MG/ML  SOLN
25.0000 mL | Freq: Once | INTRAMUSCULAR | Status: AC | PRN
  Administered 2015-11-22: 25 mL via ORAL

## 2015-11-22 MED ORDER — ONDANSETRON 4 MG PO TBDP: 4.0000 mg | ORAL_TABLET | Freq: Three times a day (TID) | ORAL | Status: DC | PRN

## 2015-11-22 MED ORDER — FENTANYL CITRATE (PF) 100 MCG/2ML IJ SOLN
50.0000 ug | Freq: Once | INTRAMUSCULAR | Status: AC
  Administered 2015-11-22: 50 ug via INTRAVENOUS
  Filled 2015-11-22: qty 2

## 2015-11-22 NOTE — ED Provider Notes (Signed)
CSN: [id number]     Arrival date & time 11/22/15  1217 History   First MD Initiated Contact with Patient 11/22/15 1411     Chief Complaint  Patient presents with  . Abdominal Pain     (Consider location/radiation/quality/duration/timing/severity/associated sxs/prior Treatment) HPI Comments: Patient with progressively worsening periumbilical pain over the past one week. She states the pain is constant, nothing makes it better or worse. She's had multiple episodes of loose nonbloody stools. She states she has had nausea and vomiting all week long. She is a diabetic but does not take any insulin. She denies any fever. She was recently treated for a upper respiratory infection with zithromax and has one dose left. She endorses positive sick contacts at home. Denies any previous abdominal surgeries but her chart reports a history of hysterectomy. She states the pain is severe and only thing that makes it better is curling into a ball.  The history is provided by the patient.    Past Medical History  Diagnosis Date  . Hypertension   . Diabetes mellitus   . Substance abuse     pt denies  . Suicidal thoughts     pt denies  . Asthma   . Bronchitis   . Acid reflux    Past Surgical History  Procedure Laterality Date  . Abdominal hysterectomy    . Cesarean section      x 2   Family History  Problem Relation Age of Onset  . Diabetes Other   . Heart disease    . Arthritis    . Lung disease    . Cancer     Social History  Substance Use Topics  . Smoking status: Current Every Day Smoker -- 0.50 packs/day for 25 years    Types: Cigarettes  . Smokeless tobacco: Never Used  . Alcohol Use: No   OB History    Gravida Para Term Preterm AB TAB SAB Ectopic Multiple Living   Review of Systems  Constitutional: Positive for activity change and appetite change. Negative for fever.  HENT: Negative for congestion and rhinorrhea.   Respiratory: Negative for cough, chest  tightness and shortness of breath.   Cardiovascular: Negative for chest pain.  Gastrointestinal: Positive for nausea, vomiting, abdominal pain and diarrhea.  Genitourinary: Negative for dysuria, hematuria, vaginal bleeding and vaginal discharge.  Musculoskeletal: Negative for myalgias and arthralgias.  Skin: Negative for rash.  Neurological: Negative for dizziness, weakness and headaches.  A complete 10 system review of systems was obtained and all systems are negative except as noted in the HPI and PMH.      Allergies  Doxycycline  Home Medications   Prior to Admission medications   Medication Sig Start Date End Date Taking? Authorizing Provider  albuterol (PROVENTIL HFA;VENTOLIN HFA) 108 (90 BASE) MCG/ACT inhaler Inhale 2 puffs into the lungs every 6 (six) hours as needed for wheezing.   Yes Historical Provider, MD  albuterol (PROVENTIL) (2.5 MG/3ML) 0.083% nebulizer solution Take 2.5 mg by nebulization every 6 (six) hours as needed for wheezing or shortness of breath.   Yes Historical Provider, MD  aspirin EC 81 MG tablet Take 81 mg by mouth daily.   Yes Historical Provider, MD  azithromycin (ZITHROMAX Z-PAK) 250 MG tablet Take 1 tablet (250 mg total) by mouth daily.  PO day 1, then  PO days 205 11/14/15  Yes Eber Hong, MD  benzonatate (TESSALON) 100 MG  capsule Take 1 capsule (100 mg total) by mouth every 8 (eight) hours. 11/14/15  Yes Eber Hong, MD  glipiZIDE (GLUCOTROL XL) 2.5 MG 24 hr tablet Take 2.5 mg by mouth daily.   Yes Historical Provider, MD  lisinopril (PRINIVIL,ZESTRIL) 5 MG tablet Take 5 mg by mouth daily.   Yes Historical Provider, MD  cyclobenzaprine (FLEXERIL) 10 MG tablet Take 1 tablet (10 mg total) by mouth 3 (three) times daily as needed. Patient not taking: Reported on 10/02/2015 07/24/15   Pauline Aus, PA-C  HYDROcodone-acetaminophen (NORCO/VICODIN) 5-325 MG per tablet Take one-two tabs po q 4-6 hrs prn pain Patient not taking: Reported on 10/02/2015  07/24/15   Tammy Triplett, PA-C  naproxen (NAPROSYN) 500 MG tablet Take 1 tablet (500 mg total) by mouth 2 (two) times daily with a meal. Patient not taking: Reported on 11/22/2015 11/14/15   Eber Hong, MD   BP 140/80 mmHg  Pulse 88  Temp(Src) 99.1 F (37.3 C) (Oral)  Resp 18  Ht  (1.753 m)  Wt 220 lb (99.791 kg)  BMI 32.47 kg/m2  SpO2 100% Physical Exam  Constitutional: She is oriented to person, place, and time. She appears well-developed and well-nourished. No distress.  Tearful, anxious  HENT:  Head: Normocephalic and atraumatic.  Mouth/Throat: Oropharynx is clear and moist. No oropharyngeal exudate.  Eyes: Conjunctivae and EOM are normal. Pupils are equal, round, and reactive to light.  Neck: Normal range of motion. Neck supple.  No meningismus.  Cardiovascular: Normal rate, regular rhythm, normal heart sounds and intact distal pulses.   No murmur heard. Pulmonary/Chest: Effort normal and breath sounds normal. No respiratory distress.  Abdominal: Soft. There is tenderness. There is no rebound and no guarding.  Periumbilical tenderness with guarding. Minimal RLQ tenderness  Musculoskeletal: Normal range of motion. She exhibits no edema or tenderness.  Neurological: She is alert and oriented to person, place, and time. No cranial nerve deficit. She exhibits normal muscle tone. Coordination normal.  No ataxia on finger to nose bilaterally. No pronator drift. 5/5 strength throughout. CN 2-12 intact.Equal grip strength. Sensation intact.   Skin: Skin is warm.  Psychiatric: She has a normal mood and affect. Her behavior is normal.  Nursing note and vitals reviewed.   ED Course  Procedures (including critical care time) Labs Review Labs Reviewed  CBC - Abnormal; Notable for the following:    WBC 11.1 (*)    RBC 5.72 (*)    Hemoglobin 15.6 (*)    HCT 46.1 (*)    All other components within normal limits  URINALYSIS, ROUTINE W REFLEX MICROSCOPIC (NOT AT [facility]) -  Abnormal; Notable for the following:    APPearance CLOUDY (*)    Ketones, ur TRACE (*)    All other components within normal limits  C DIFFICILE QUICK SCREEN W PCR REFLEX  PREGNANCY, URINE  LIPASE, BLOOD  COMPREHENSIVE METABOLIC PANEL    Imaging Review No results found. I have personally reviewed and evaluated these images and lab results as part of my medical decision-making.   EKG Interpretation None      MDM   Final diagnoses:  None   Periumbilical pain with nausea, vomiting, diarrhea. States symptoms are worse when she was here one week ago. Unable to tolerate by mouth and has no appetite.  Urinalysis negative. HCG negative.  White blood cell count 11.  IV fluids, antiemetics, CT abdomen will be obtained given patient's degree of periumbilical discomfort. Care transferred to Dr. Hyacinth Meeker at shift change.  Glynn Octave, MD 11/22/15 1622

## 2015-11-22 NOTE — ED Notes (Signed)
PT c/o lower abdominal pain with nausea and diarrhea x1 week. PT states she started a zpack x2 days ago from recent ED visit. PT also states foul smelling urine.

## 2015-11-22 NOTE — Discharge Instructions (Signed)

## 2015-11-22 NOTE — ED Notes (Signed)
Pt made aware to return if symptoms worsen or if any life threatening symptoms occur.   

## 2015-11-22 NOTE — ED Provider Notes (Signed)
The patient was rechecked, she states that she is improved, still has mild pain, nausea.,  Reviewed CT scan and labs with patient at the bedside, she is aware of all of her findings, she will follow-up in the community, no acute surgical or medical findings that would require further evaluation or admission.  In addition to written d/c instructions, the pt was given verbal d/c instructions including the indications for return and expressed understanding to the instructions.   Eber Hong, MD 11/22/15 [phone]

## 2016-04-12 ENCOUNTER — Emergency Department (HOSPITAL_COMMUNITY): Payer: Self-pay

## 2016-04-12 ENCOUNTER — Encounter (HOSPITAL_COMMUNITY): Payer: Self-pay | Admitting: Emergency Medicine

## 2016-04-12 ENCOUNTER — Emergency Department (HOSPITAL_COMMUNITY)
Admission: EM | Admit: 2016-04-12 | Discharge: 2016-04-12 | Disposition: A | Discharge status: Home / Self Care | Payer: Self-pay | Attending: Emergency Medicine | Admitting: Emergency Medicine

## 2016-04-12 DIAGNOSIS — W228XXA Striking against or struck by other objects, initial encounter: Secondary | ICD-10-CM | POA: Insufficient documentation

## 2016-04-12 DIAGNOSIS — E119 Type 2 diabetes mellitus without complications: Secondary | ICD-10-CM | POA: Insufficient documentation

## 2016-04-12 DIAGNOSIS — Z79899 Other long term (current) drug therapy: Secondary | ICD-10-CM | POA: Insufficient documentation

## 2016-04-12 DIAGNOSIS — J45909 Unspecified asthma, uncomplicated: Secondary | ICD-10-CM | POA: Insufficient documentation

## 2016-04-12 DIAGNOSIS — Y929 Unspecified place or not applicable: Secondary | ICD-10-CM | POA: Insufficient documentation

## 2016-04-12 DIAGNOSIS — Z7982 Long term (current) use of aspirin: Secondary | ICD-10-CM | POA: Insufficient documentation

## 2016-04-12 DIAGNOSIS — S62667A Nondisplaced fracture of distal phalanx of left little finger, initial encounter for closed fracture: Secondary | ICD-10-CM | POA: Insufficient documentation

## 2016-04-12 DIAGNOSIS — Y939 Activity, unspecified: Secondary | ICD-10-CM | POA: Insufficient documentation

## 2016-04-12 DIAGNOSIS — F17210 Nicotine dependence, cigarettes, uncomplicated: Secondary | ICD-10-CM | POA: Insufficient documentation

## 2016-04-12 DIAGNOSIS — Z7984 Long term (current) use of oral hypoglycemic drugs: Secondary | ICD-10-CM | POA: Insufficient documentation

## 2016-04-12 DIAGNOSIS — M549 Dorsalgia, unspecified: Secondary | ICD-10-CM | POA: Insufficient documentation

## 2016-04-12 DIAGNOSIS — Y999 Unspecified external cause status: Secondary | ICD-10-CM | POA: Insufficient documentation

## 2016-04-12 DIAGNOSIS — S20212A Contusion of left front wall of thorax, initial encounter: Secondary | ICD-10-CM | POA: Insufficient documentation

## 2016-04-12 DIAGNOSIS — S62609A Fracture of unspecified phalanx of unspecified finger, initial encounter for closed fracture: Secondary | ICD-10-CM

## 2016-04-12 DIAGNOSIS — I10 Essential (primary) hypertension: Secondary | ICD-10-CM | POA: Insufficient documentation

## 2016-04-12 MED ORDER — IBUPROFEN 600 MG PO TABS: 600.0000 mg | ORAL_TABLET | Freq: Four times a day (QID) | ORAL | Status: DC | PRN
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
## 2016-04-12 NOTE — Discharge Instructions (Signed)
Take ibuprofen for pain. Keep the finger in a splint for at least 4 weeks. Follow with primary care doctor to make sure it's healing. Return if any issues.   Finger Fracture Fractures of fingers are breaks in the bones of the fingers. There are many types of fractures. There are different ways of treating these fractures. Your health care provider will discuss the best way to treat your fracture. CAUSES Traumatic injury is the main cause of broken fingers. These include:  Injuries while playing sports.  Workplace injuries.  Falls. RISK FACTORS Activities that can increase your risk of finger fractures include:  Sports.  Workplace activities that involve machinery.  A condition called osteoporosis, which can make your bones less dense and cause them to fracture more easily. SIGNS AND SYMPTOMS The main symptoms of a broken finger are pain and swelling within 15 minutes after the injury. Other symptoms include:  Bruising of your finger.  Stiffness of your finger.  Numbness of your finger.  Exposed bones (compound fracture) if the fracture is severe. DIAGNOSIS  The best way to diagnose a broken bone is with X-ray imaging. Additionally, your health care provider will use this X-ray image to evaluate the position of the broken finger bones.  TREATMENT  Finger fractures can be treated with:   Nonreduction--This means the bones are in place. The finger is splinted without changing the positions of the bone pieces. The splint is usually left on for about a week to 10 days. This will depend on your fracture and what your health care provider thinks.  Closed reduction--The bones are put back into position without using surgery. The finger is then splinted.  Open reduction and internal fixation--The fracture site is opened. Then the bone pieces are fixed into place with pins or some type of hardware. This is seldom required. It depends on the severity of the fracture. HOME CARE  INSTRUCTIONS   Follow your health care provider's instructions regarding activities, exercises, and physical therapy.  Only take over-the-counter or prescription medicines for pain, discomfort, or fever as directed by your health care provider. SEEK MEDICAL CARE IF: You have pain or swelling that limits the motion or use of your fingers. SEEK IMMEDIATE MEDICAL CARE IF:  Your finger becomes numb. MAKE SURE YOU:   Understand these instructions.  Will watch your condition.  Will get help right away if you are not doing well or get worse.   This information is not intended to replace advice given to you by your health care provider. Make sure you discuss any questions you have with your health care provider.   Document Released: 01/30/2001 Document Revised: 08/08/2013 Document Reviewed: 05/30/2013 Elsevier Interactive Patient Education 2016 Elsevier Inc[employer]   Rib Contusion A rib contusion is a deep bruise on your rib area. Contusions are the result of a blunt trauma that causes bleeding and injury to the tissues under the skin. A rib contusion may involve bruising of the ribs and of the skin and muscles in the area. The skin overlying the contusion may turn blue, purple, or yellow. Minor injuries will give you a painless contusion, but more severe contusions may stay painful and swollen for a few weeks. CAUSES  A contusion is usually caused by a blow, trauma, or direct force to an area of the body. This often occurs while playing contact sports. SYMPTOMS  Swelling and redness of the injured area.  Discoloration of the injured area.  Tenderness and soreness of the injured area.  Pain with or without movement. DIAGNOSIS  The diagnosis can be made by taking a medical history and performing a physical exam. An X-ray, CT scan, or MRI may be needed to determine if there were any associated injuries, such as broken bones (fractures) or internal injuries. TREATMENT  Often, the best treatment  for a rib contusion is rest. Icing or applying cold compresses to the injured area may help reduce swelling and inflammation. Deep breathing exercises may be recommended to reduce the risk of partial lung collapse and pneumonia. Over-the-counter or prescription medicines may also be recommended for pain control. HOME CARE INSTRUCTIONS   Apply ice to the injured area:  Put ice in a plastic bag.  Place a towel between your skin and the bag.  Leave the ice on for 20 minutes, 2-3 times per day.  Take medicines only as directed by your health care provider.  Rest the injured area. Avoid strenuous activity and any activities or movements that cause pain. Be careful during activities and avoid bumping the injured area.  Perform deep-breathing exercises as directed by your health care provider.  Do not lift anything that is heavier than 5 lb (2.3 kg) until your health care provider approves.  Do not use any tobacco products, including cigarettes, chewing tobacco, or electronic cigarettes. If you need help quitting, ask your health care provider. SEEK MEDICAL CARE IF:   You have increased bruising or swelling.  You have pain that is not controlled with treatment.  You have a fever. SEEK IMMEDIATE MEDICAL CARE IF:   You have difficulty breathing or shortness of breath.  You develop a continual cough, or you cough up thick or bloody sputum.  You feel sick to your stomach (nauseous), you throw up (vomit), or you have abdominal pain.   This information is not intended to replace advice given to you by your health care provider. Make sure you discuss any questions you have with your health care provider.   Document Released: 07/13/2001 Document Revised: 11/08/2014 Document Reviewed: 07/30/2014 Elsevier Interactive Patient Education [employer]2016 Elsevier Inc.
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
## 2016-05-27 ENCOUNTER — Emergency Department (HOSPITAL_COMMUNITY)
Admission: EM | Admit: 2016-05-27 | Discharge: 2016-05-27 | Disposition: A | Discharge status: Home / Self Care | Payer: Self-pay | Attending: Emergency Medicine | Admitting: Emergency Medicine

## 2016-05-27 ENCOUNTER — Emergency Department (HOSPITAL_COMMUNITY): Payer: Self-pay

## 2016-05-27 ENCOUNTER — Encounter (HOSPITAL_COMMUNITY): Payer: Self-pay | Admitting: Emergency Medicine

## 2016-05-27 DIAGNOSIS — Z79899 Other long term (current) drug therapy: Secondary | ICD-10-CM | POA: Insufficient documentation

## 2016-05-27 DIAGNOSIS — Y999 Unspecified external cause status: Secondary | ICD-10-CM | POA: Insufficient documentation

## 2016-05-27 DIAGNOSIS — I10 Essential (primary) hypertension: Secondary | ICD-10-CM | POA: Insufficient documentation

## 2016-05-27 DIAGNOSIS — J45909 Unspecified asthma, uncomplicated: Secondary | ICD-10-CM | POA: Insufficient documentation

## 2016-05-27 DIAGNOSIS — Z7982 Long term (current) use of aspirin: Secondary | ICD-10-CM | POA: Insufficient documentation

## 2016-05-27 DIAGNOSIS — Y929 Unspecified place or not applicable: Secondary | ICD-10-CM | POA: Insufficient documentation

## 2016-05-27 DIAGNOSIS — Y939 Activity, unspecified: Secondary | ICD-10-CM | POA: Insufficient documentation

## 2016-05-27 DIAGNOSIS — E119 Type 2 diabetes mellitus without complications: Secondary | ICD-10-CM | POA: Insufficient documentation

## 2016-05-27 DIAGNOSIS — F17210 Nicotine dependence, cigarettes, uncomplicated: Secondary | ICD-10-CM | POA: Insufficient documentation

## 2016-05-27 DIAGNOSIS — S022XXA Fracture of nasal bones, initial encounter for closed fracture: Secondary | ICD-10-CM | POA: Insufficient documentation

## 2016-05-27 LAB — CBG MONITORING, ED: GLUCOSE-CAPILLARY: 100 mg/dL — AB (ref 65–99)

## 2016-05-27 MED ORDER — HYDROCODONE-ACETAMINOPHEN 5-325 MG PO TABS: 1.0000 | ORAL_TABLET | ORAL | 0 refills | Status: DC | PRN

## 2016-05-27 MED ORDER — OXYCODONE-ACETAMINOPHEN 5-325 MG PO TABS
2.0000 | ORAL_TABLET | Freq: Once | ORAL | Status: AC
  Administered 2016-05-27: 2 via ORAL
  Filled 2016-05-27: qty 2

## 2016-05-27 NOTE — ED Notes (Signed)
Pt c/o her BS being low. Pt's cbg was 100. Pt given a coke and peanut butter and graham crackers per Ivery Quale, PA

## 2016-05-27 NOTE — ED Notes (Signed)
To radiology via wheelchair

## 2016-05-27 NOTE — Discharge Instructions (Signed)
Your have a displaced broken bone in your nose area. Please apply ice for swelling. Use tylenol or ibuprofen for mild pain. Use norco for more severe pain. Please see Dr Suszanne Conners for evaluation of your nasal fracture as soon as possible.
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
## 2016-05-27 NOTE — ED Provider Notes (Signed)
Care continued from [facility]. Pt return from CT.  Recheck - Pt awake and alert.  PERRL. Pain with EOM at the left nasal corner of the eye. Anterior chamber clear. Pain to palpation of the nose. Deformity of the nose. No active bleeding. No TMJ abnormality. No mandible abnormality. Good ROM of the neck. Lungs clear bilat. Heart RRR. FROM of the upper and lower extremity. No gross neuro deficit noted. Pt ambulatory without problem.  CT Head scan reveals no intracranial abnormality. CT Max/Facial reveals displaced right sided nasal bone fracture.  Discussed results with the patient. Will refer to ENT Dr Suszanne Conners. Ice pack provided. Rx for norco given to the patient. Pt indicates she has a safe place to go at discharge. Pt will return if any changes, or problems.   Ivery Quale, PA-C 05/27/16 1916    Shaune Pollack, MD 05/27/16 [phone]
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
## 2016-05-27 NOTE — ED Notes (Signed)
MD at bedside. 

## 2016-09-04 ENCOUNTER — Emergency Department (HOSPITAL_COMMUNITY): Payer: Self-pay

## 2016-09-04 ENCOUNTER — Encounter (HOSPITAL_COMMUNITY): Payer: Self-pay | Admitting: Emergency Medicine

## 2016-09-04 ENCOUNTER — Emergency Department (HOSPITAL_COMMUNITY)
Admission: EM | Admit: 2016-09-04 | Discharge: 2016-09-04 | Disposition: A | Discharge status: Home / Self Care | Payer: Self-pay | Attending: Emergency Medicine | Admitting: Emergency Medicine

## 2016-09-04 DIAGNOSIS — Z7982 Long term (current) use of aspirin: Secondary | ICD-10-CM | POA: Insufficient documentation

## 2016-09-04 DIAGNOSIS — L0291 Cutaneous abscess, unspecified: Secondary | ICD-10-CM

## 2016-09-04 DIAGNOSIS — J45909 Unspecified asthma, uncomplicated: Secondary | ICD-10-CM | POA: Insufficient documentation

## 2016-09-04 DIAGNOSIS — S61411A Laceration without foreign body of right hand, initial encounter: Secondary | ICD-10-CM | POA: Insufficient documentation

## 2016-09-04 DIAGNOSIS — F17210 Nicotine dependence, cigarettes, uncomplicated: Secondary | ICD-10-CM | POA: Insufficient documentation

## 2016-09-04 DIAGNOSIS — Y929 Unspecified place or not applicable: Secondary | ICD-10-CM | POA: Insufficient documentation

## 2016-09-04 DIAGNOSIS — I10 Essential (primary) hypertension: Secondary | ICD-10-CM | POA: Insufficient documentation

## 2016-09-04 DIAGNOSIS — W25XXXA Contact with sharp glass, initial encounter: Secondary | ICD-10-CM | POA: Insufficient documentation

## 2016-09-04 DIAGNOSIS — Y9389 Activity, other specified: Secondary | ICD-10-CM | POA: Insufficient documentation

## 2016-09-04 DIAGNOSIS — Z79899 Other long term (current) drug therapy: Secondary | ICD-10-CM | POA: Insufficient documentation

## 2016-09-04 DIAGNOSIS — E119 Type 2 diabetes mellitus without complications: Secondary | ICD-10-CM | POA: Insufficient documentation

## 2016-09-04 DIAGNOSIS — Y999 Unspecified external cause status: Secondary | ICD-10-CM | POA: Insufficient documentation

## 2016-09-04 DIAGNOSIS — L0201 Cutaneous abscess of face: Secondary | ICD-10-CM | POA: Insufficient documentation

## 2016-09-04 MED ORDER — SULFAMETHOXAZOLE-TRIMETHOPRIM 800-160 MG PO TABS: 1.0000 | ORAL_TABLET | Freq: Two times a day (BID) | ORAL | 0 refills | Status: AC

## 2016-09-04 MED ORDER — IBUPROFEN 800 MG PO TABS: 800.0000 mg | ORAL_TABLET | Freq: Three times a day (TID) | ORAL | 0 refills | Status: DC

## 2016-09-04 MED ORDER — LIDOCAINE HCL (PF) 2 % IJ SOLN
INTRAMUSCULAR | Status: AC
  Administered 2016-09-04: 12:00:00
  Filled 2016-09-04: qty 10

## 2016-09-04 NOTE — ED Notes (Signed)
Pt given warm blanket and socks per request.

## 2016-09-04 NOTE — ED Notes (Signed)
Pt transported to xray 

## 2016-09-04 NOTE — Discharge Instructions (Signed)
Warm compresses to swollen area on face.

## 2016-09-04 NOTE — ED Notes (Signed)
Pt returned from xray

## 2016-09-04 NOTE — ED Notes (Addendum)
Unwrapped pt's bandage that she placed at home. Soaking RT hand in betadine and sterile water. No bleeding or discharge noted at this time.

## 2016-09-04 NOTE — ED Notes (Signed)
Pt given supplies to care for wounds at home.

## 2016-09-04 NOTE — ED Notes (Signed)
Lidocaine and suture cart placed at bedside 
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
## 2017-03-05 ENCOUNTER — Emergency Department (HOSPITAL_COMMUNITY): Payer: Self-pay

## 2017-03-05 ENCOUNTER — Encounter (HOSPITAL_COMMUNITY): Payer: Self-pay | Admitting: Emergency Medicine

## 2017-03-05 ENCOUNTER — Emergency Department (HOSPITAL_COMMUNITY)
Admission: EM | Admit: 2017-03-05 | Discharge: 2017-03-05 | Disposition: A | Discharge status: Home / Self Care | Payer: Self-pay | Attending: Emergency Medicine | Admitting: Emergency Medicine

## 2017-03-05 DIAGNOSIS — J45909 Unspecified asthma, uncomplicated: Secondary | ICD-10-CM | POA: Insufficient documentation

## 2017-03-05 DIAGNOSIS — W010XXA Fall on same level from slipping, tripping and stumbling without subsequent striking against object, initial encounter: Secondary | ICD-10-CM | POA: Insufficient documentation

## 2017-03-05 DIAGNOSIS — E119 Type 2 diabetes mellitus without complications: Secondary | ICD-10-CM | POA: Insufficient documentation

## 2017-03-05 DIAGNOSIS — Z79899 Other long term (current) drug therapy: Secondary | ICD-10-CM | POA: Insufficient documentation

## 2017-03-05 DIAGNOSIS — Y999 Unspecified external cause status: Secondary | ICD-10-CM | POA: Insufficient documentation

## 2017-03-05 DIAGNOSIS — S92354A Nondisplaced fracture of fifth metatarsal bone, right foot, initial encounter for closed fracture: Secondary | ICD-10-CM

## 2017-03-05 DIAGNOSIS — Y929 Unspecified place or not applicable: Secondary | ICD-10-CM | POA: Insufficient documentation

## 2017-03-05 DIAGNOSIS — S92514A Nondisplaced fracture of proximal phalanx of right lesser toe(s), initial encounter for closed fracture: Secondary | ICD-10-CM | POA: Insufficient documentation

## 2017-03-05 DIAGNOSIS — F17210 Nicotine dependence, cigarettes, uncomplicated: Secondary | ICD-10-CM | POA: Insufficient documentation

## 2017-03-05 DIAGNOSIS — I10 Essential (primary) hypertension: Secondary | ICD-10-CM | POA: Insufficient documentation

## 2017-03-05 DIAGNOSIS — Y939 Activity, unspecified: Secondary | ICD-10-CM | POA: Insufficient documentation

## 2017-03-05 DIAGNOSIS — S92355A Nondisplaced fracture of fifth metatarsal bone, left foot, initial encounter for closed fracture: Secondary | ICD-10-CM | POA: Insufficient documentation

## 2017-03-05 MED ORDER — HYDROCODONE-ACETAMINOPHEN 5-325 MG PO TABS
1.0000 | ORAL_TABLET | Freq: Once | ORAL | Status: AC
  Administered 2017-03-05: 1 via ORAL
  Filled 2017-03-05: qty 1

## 2017-03-05 MED ORDER — TRAMADOL HCL 50 MG PO TABS: 50.0000 mg | ORAL_TABLET | Freq: Four times a day (QID) | ORAL | 0 refills | Status: DC | PRN

## 2017-03-05 MED ORDER — HYDROCODONE-ACETAMINOPHEN 5-325 MG PO TABS: 1.0000 | ORAL_TABLET | ORAL | 0 refills | Status: DC | PRN

## 2017-03-05 NOTE — ED Provider Notes (Addendum)
[facility] Provider Note   CSN: [id number] Arrival date & time: 03/05/17  0102     History   Chief Complaint Chief Complaint  Patient presents with  . Foot Pain    HPI Katrina Good is a 46 y.o. female.  Patient presents with complaint of right foot injury. Patient reports that she tripped yesterday, injuring her right foot. Since then she has been having pain on the outside portion of her right foot that worsens with trying to put any weight on it. She denies any other injury from the fall.      Past Medical History:  Diagnosis Date  . Acid reflux   . Asthma   . Bronchitis   . Diabetes mellitus   . Hypertension   . Substance abuse    pt denies  . Suicidal thoughts    pt denies    Patient Active Problem List   Diagnosis Date Noted  . Colles' fracture of right radius 03/13/2012    Past Surgical History:  Procedure Laterality Date  . ABDOMINAL HYSTERECTOMY    . CESAREAN SECTION     x 2    OB History    Gravida Para Term Preterm AB Living   5 2 2   3 2    SAB TAB Ectopic Multiple Live Births   2 1             Home Medications    Prior to Admission medications   Medication Sig Start Date End Date Taking? Authorizing Provider  albuterol (PROVENTIL HFA;VENTOLIN HFA) 108 (90 BASE) MCG/ACT inhaler Inhale 2 puffs into the lungs every 6 (six) hours as needed for wheezing.   Yes [provider]  albuterol (PROVENTIL) (2.5 MG/3ML) 0.083% nebulizer solution Take 2.5 mg by nebulization every 6 (six) hours as needed for wheezing or shortness of breath.   Yes [provider]  amLODipine (NORVASC) 5 MG tablet Take 5 mg by mouth daily.   Yes [provider]  aspirin EC 81 MG tablet Take 81 mg by mouth daily.   Yes [provider]  lisinopril (PRINIVIL,ZESTRIL) 5 MG tablet Take 5 mg by mouth daily.   Yes [provider]  HYDROcodone-acetaminophen (NORCO/VICODIN) 5-325 MG tablet Take 1 tablet by mouth every 4 (four)  hours as needed. 05/27/16   Ivery Quale, PA-C  ibuprofen (ADVIL,MOTRIN) 800 MG tablet Take 1 tablet (800 mg total) by mouth 3 (three) times daily. 09/04/16   Elson Areas, PA-C    Family History Family History  Problem Relation Age of Onset  . Diabetes Other   . Heart disease    . Arthritis    . Lung disease    . Cancer      Social History Social History  Substance Use Topics  . Smoking status: Current Every Day Smoker    Packs/day: 2.00    Years: 25.00    Types: Cigarettes  . Smokeless tobacco: Never Used     Comment: 2-3 packs  . Alcohol use Yes     Comment: occ     Allergies   Doxycycline   Review of Systems Review of Systems  Musculoskeletal:       Foot pain  Skin: Negative.      Physical Exam Updated Vital Signs BP 138/86 (BP Location: Right Arm)   Pulse 89   Temp 98.6 F (37 C) (Oral)   Resp 18   Ht 5\' 9"  (1.753 m)   Wt 225 lb (102.1 kg)  SpO2 98%   BMI 33.23 kg/m   Physical Exam  Constitutional: She appears well-developed and well-nourished.  HENT:  Head: Atraumatic.  Eyes: Pupils are equal, round, and reactive to light.  Neck: Normal range of motion. Neck supple.  Pulmonary/Chest: Effort normal.  Musculoskeletal:       Right foot: There is tenderness and swelling.       Feet:     ED Treatments / Results  Labs (all labs ordered are listed, but only abnormal results are displayed) Labs Reviewed - No data to display  EKG  EKG Interpretation None       Radiology Dg Foot Complete Right  Result Date: 03/05/2017 CLINICAL DATA:  Belly yesterday, pain and swelling to the right foot EXAM: RIGHT FOOT COMPLETE - 3+ VIEW COMPARISON:  None. FINDINGS: Nondisplaced fracture involving the proximal aspect of the fifth proximal phalanx, without definite articular extension. No subluxation. Questionable linear lucency/nondisplaced fracture involving the distal shaft of the fifth metatarsal. Minimal irregularity of the tuft of the fifth distal  phalanx. IMPRESSION: 1. Nondisplaced fracture involving the base of the fifth proximal phalanx without definite articular extension 2. Possible nondisplaced fracture involving the neck of the fifth metacarpal Electronically Signed   By: Jasmine PangKim  Fujinaga M.D.   On: 03/05/2017 02:39    Procedures Procedures (including critical care time)  Medications Ordered in ED Medications  HYDROcodone-acetaminophen (NORCO/VICODIN) 5-325 MG per tablet 1 tablet (1 tablet Oral Given 03/05/17 0248)     Initial Impression / Assessment and Plan / ED Course  I have reviewed the triage vital signs and the nursing notes.  Pertinent labs & imaging results that were available during my care of the patient were reviewed by me and considered in my medical decision making (see chart for details).     Patient presents with right foot pain after a fall. Patient complaining primarily of pain over the fifth metatarsal region. There is swelling without any wound. X-ray shows fifth toe fracture, but question of fifth metatarsal fracture as well. Clinically, fifth metatarsal fracture is suspected. Will place in a splint, follow-up with orthopedics.  SPLINT APPLICATION Date/Time: 3:27 AM Authorized by: Gilda CreasePOLLINA, Reign Bartnick J. Consent: Verbal consent obtained. Risks and benefits: risks, benefits and alternatives were discussed Consent given by: patient Splint applied WG:NFAOZby:nurse Location details: right lower leg Splint type: orthoglass short leg Post-procedure: The splinted body part was neurovascularly unchanged following the procedure. Patient tolerance: Patient tolerated the procedure well with no immediate complications.     Final Clinical Impressions(s) / ED Diagnoses   Final diagnoses:  Closed nondisplaced fracture of proximal phalanx of lesser toe of right foot, initial encounter  Closed nondisplaced fracture of fifth metatarsal bone of right foot, initial encounter    New Prescriptions New Prescriptions   No  medications on file     Gilda CreasePollina, Loukas Antonson J, MD 03/05/17 [phone]    Gilda CreasePollina, Safiyya Stokes J, MD 03/05/17 [phone]

## 2017-03-05 NOTE — ED Triage Notes (Signed)
Pt states she fell yesterday and does not know what she hit her foot on.  Having pain and swelling to right foot
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
## 2017-03-08 MED FILL — Hydrocodone-Acetaminophen Tab 5-325 MG: ORAL | Qty: 6 | Status: AC

## 2017-06-20 ENCOUNTER — Emergency Department (HOSPITAL_COMMUNITY)
Admission: EM | Admit: 2017-06-20 | Discharge: 2017-06-20 | Disposition: A | Discharge status: Home / Self Care | Payer: No Typology Code available for payment source | Attending: Emergency Medicine | Admitting: Emergency Medicine

## 2017-06-20 ENCOUNTER — Emergency Department (HOSPITAL_COMMUNITY): Payer: No Typology Code available for payment source

## 2017-06-20 ENCOUNTER — Encounter (HOSPITAL_COMMUNITY): Payer: Self-pay

## 2017-06-20 DIAGNOSIS — I10 Essential (primary) hypertension: Secondary | ICD-10-CM | POA: Insufficient documentation

## 2017-06-20 DIAGNOSIS — Z7982 Long term (current) use of aspirin: Secondary | ICD-10-CM | POA: Insufficient documentation

## 2017-06-20 DIAGNOSIS — E119 Type 2 diabetes mellitus without complications: Secondary | ICD-10-CM | POA: Diagnosis not present

## 2017-06-20 DIAGNOSIS — Z79899 Other long term (current) drug therapy: Secondary | ICD-10-CM | POA: Diagnosis not present

## 2017-06-20 DIAGNOSIS — S8990XA Unspecified injury of unspecified lower leg, initial encounter: Secondary | ICD-10-CM

## 2017-06-20 DIAGNOSIS — S8992XA Unspecified injury of left lower leg, initial encounter: Secondary | ICD-10-CM | POA: Diagnosis not present

## 2017-06-20 DIAGNOSIS — Y92410 Unspecified street and highway as the place of occurrence of the external cause: Secondary | ICD-10-CM | POA: Insufficient documentation

## 2017-06-20 DIAGNOSIS — F17210 Nicotine dependence, cigarettes, uncomplicated: Secondary | ICD-10-CM | POA: Diagnosis not present

## 2017-06-20 DIAGNOSIS — Y999 Unspecified external cause status: Secondary | ICD-10-CM | POA: Insufficient documentation

## 2017-06-20 DIAGNOSIS — J45909 Unspecified asthma, uncomplicated: Secondary | ICD-10-CM | POA: Insufficient documentation

## 2017-06-20 DIAGNOSIS — Y9389 Activity, other specified: Secondary | ICD-10-CM | POA: Insufficient documentation

## 2017-06-20 MED ORDER — HYDROCODONE-ACETAMINOPHEN 5-325 MG PO TABS
1.0000 | ORAL_TABLET | Freq: Once | ORAL | Status: AC
  Administered 2017-06-20: 1 via ORAL
  Filled 2017-06-20: qty 1

## 2017-06-20 MED ORDER — DICLOFENAC SODIUM 75 MG PO TBEC: 75.0000 mg | DELAYED_RELEASE_TABLET | Freq: Two times a day (BID) | ORAL | 0 refills | Status: DC

## 2017-06-20 NOTE — Discharge Instructions (Signed)
Where the knee brace as needed for weightbearing. Do not wear continuously. Apply ice packs on and off to knee. Call Dr. Mort Sawyers office to arrange a follow-up appointment in one week if not improving.
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
## 2017-06-20 NOTE — ED Notes (Signed)
Pt ambulatory to waiting room. Pt verbalized understanding of discharge instructions.   

## 2017-06-20 NOTE — ED Notes (Signed)
Patient transported to X-ray 

## 2017-06-21 ENCOUNTER — Encounter (HOSPITAL_COMMUNITY): Payer: Self-pay | Admitting: Cardiology

## 2017-06-21 ENCOUNTER — Emergency Department (HOSPITAL_COMMUNITY)
Admission: EM | Admit: 2017-06-21 | Discharge: 2017-06-21 | Disposition: A | Discharge status: Home / Self Care | Payer: Self-pay | Attending: Emergency Medicine | Admitting: Emergency Medicine

## 2017-06-21 DIAGNOSIS — F17210 Nicotine dependence, cigarettes, uncomplicated: Secondary | ICD-10-CM | POA: Insufficient documentation

## 2017-06-21 DIAGNOSIS — Y33XXXA Other specified events, undetermined intent, initial encounter: Secondary | ICD-10-CM | POA: Insufficient documentation

## 2017-06-21 DIAGNOSIS — T7840XA Allergy, unspecified, initial encounter: Secondary | ICD-10-CM | POA: Insufficient documentation

## 2017-06-21 DIAGNOSIS — Y929 Unspecified place or not applicable: Secondary | ICD-10-CM | POA: Insufficient documentation

## 2017-06-21 DIAGNOSIS — Z79899 Other long term (current) drug therapy: Secondary | ICD-10-CM | POA: Insufficient documentation

## 2017-06-21 DIAGNOSIS — J45909 Unspecified asthma, uncomplicated: Secondary | ICD-10-CM | POA: Insufficient documentation

## 2017-06-21 DIAGNOSIS — E119 Type 2 diabetes mellitus without complications: Secondary | ICD-10-CM | POA: Insufficient documentation

## 2017-06-21 DIAGNOSIS — I10 Essential (primary) hypertension: Secondary | ICD-10-CM | POA: Insufficient documentation

## 2017-06-21 DIAGNOSIS — Y999 Unspecified external cause status: Secondary | ICD-10-CM | POA: Insufficient documentation

## 2017-06-21 DIAGNOSIS — Y939 Activity, unspecified: Secondary | ICD-10-CM | POA: Insufficient documentation

## 2017-06-21 MED ORDER — PREDNISONE 20 MG PO TABS: ORAL_TABLET | ORAL | 0 refills | Status: DC

## 2017-06-21 MED ORDER — DIPHENHYDRAMINE HCL 50 MG/ML IJ SOLN
25.0000 mg | Freq: Once | INTRAMUSCULAR | Status: AC
  Administered 2017-06-21: 25 mg via INTRAVENOUS
  Filled 2017-06-21: qty 1

## 2017-06-21 MED ORDER — METHYLPREDNISOLONE SODIUM SUCC 125 MG IJ SOLR
125.0000 mg | Freq: Once | INTRAMUSCULAR | Status: AC
  Administered 2017-06-21: 125 mg via INTRAVENOUS
  Filled 2017-06-21: qty 2

## 2017-06-21 MED ORDER — FAMOTIDINE IN NACL 20-0.9 MG/50ML-% IV SOLN
20.0000 mg | Freq: Once | INTRAVENOUS | Status: AC
  Administered 2017-06-21: 20 mg via INTRAVENOUS
  Filled 2017-06-21: qty 50

## 2017-06-21 NOTE — Discharge Instructions (Signed)
Take benadryl 25 mg every 6 hours if needed for rash or itching.   Follow up if not improving
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
## 2017-06-21 NOTE — ED Notes (Signed)
Pt states itching has improved.

## 2017-11-20 ENCOUNTER — Other Ambulatory Visit: Payer: Self-pay

## 2017-11-20 ENCOUNTER — Encounter (HOSPITAL_COMMUNITY): Payer: Self-pay | Admitting: Emergency Medicine

## 2017-11-20 ENCOUNTER — Emergency Department (HOSPITAL_COMMUNITY)
Admission: EM | Admit: 2017-11-20 | Discharge: 2017-11-20 | Disposition: A | Discharge status: Home / Self Care | Payer: Self-pay | Attending: Emergency Medicine | Admitting: Emergency Medicine

## 2017-11-20 DIAGNOSIS — Y929 Unspecified place or not applicable: Secondary | ICD-10-CM | POA: Insufficient documentation

## 2017-11-20 DIAGNOSIS — F17210 Nicotine dependence, cigarettes, uncomplicated: Secondary | ICD-10-CM | POA: Insufficient documentation

## 2017-11-20 DIAGNOSIS — Y998 Other external cause status: Secondary | ICD-10-CM | POA: Insufficient documentation

## 2017-11-20 DIAGNOSIS — X58XXXA Exposure to other specified factors, initial encounter: Secondary | ICD-10-CM | POA: Insufficient documentation

## 2017-11-20 DIAGNOSIS — K029 Dental caries, unspecified: Secondary | ICD-10-CM | POA: Insufficient documentation

## 2017-11-20 DIAGNOSIS — J45909 Unspecified asthma, uncomplicated: Secondary | ICD-10-CM | POA: Insufficient documentation

## 2017-11-20 DIAGNOSIS — Z7982 Long term (current) use of aspirin: Secondary | ICD-10-CM | POA: Insufficient documentation

## 2017-11-20 DIAGNOSIS — Y9389 Activity, other specified: Secondary | ICD-10-CM | POA: Insufficient documentation

## 2017-11-20 DIAGNOSIS — Z79899 Other long term (current) drug therapy: Secondary | ICD-10-CM | POA: Insufficient documentation

## 2017-11-20 DIAGNOSIS — T161XXA Foreign body in right ear, initial encounter: Secondary | ICD-10-CM | POA: Insufficient documentation

## 2017-11-20 LAB — CBG MONITORING, ED: GLUCOSE-CAPILLARY: 90 mg/dL (ref 65–99)

## 2017-11-20 MED ORDER — LISINOPRIL 5 MG PO TABS: 5.0000 mg | ORAL_TABLET | Freq: Every day | ORAL | 0 refills | Status: DC

## 2017-11-20 MED ORDER — AMLODIPINE BESYLATE 5 MG PO TABS: 5.0000 mg | ORAL_TABLET | Freq: Every day | ORAL | 0 refills | Status: DC

## 2017-11-20 MED ORDER — IBUPROFEN 800 MG PO TABS
800.0000 mg | ORAL_TABLET | Freq: Once | ORAL | Status: AC
  Administered 2017-11-20: 800 mg via ORAL
  Filled 2017-11-20: qty 1

## 2017-11-20 MED ORDER — IBUPROFEN 600 MG PO TABS: 600.0000 mg | ORAL_TABLET | Freq: Four times a day (QID) | ORAL | 0 refills | Status: DC | PRN

## 2017-11-20 MED ORDER — PENICILLIN V POTASSIUM 500 MG PO TABS: 500.0000 mg | ORAL_TABLET | Freq: Three times a day (TID) | ORAL | 0 refills | Status: DC

## 2017-11-20 NOTE — ED Provider Notes (Signed)
[facility] EMERGENCY DEPARTMENT Provider Note   CSN: [id number] Arrival date & time: 11/20/17  2202     History   Chief Complaint Chief Complaint  Patient presents with  . Dental Pain    HPI Katrina Good is a 47 y.o. female.  HPI   47 year old female presenting complaining of dental pain. Patient report recurrent pain to the left side of her face in her teeth ongoing for the past 2 weeks. Pain is waxing waning but became much worse today. Describe pain as a sharp and throbbing sensation, persistent, worse with temperature change. Pain radiates to her left ear. She is trying to gargle with salt water without relief. She does not have a dentist. She denies having fever, sore throat, neck pain, chest pain or trouble breathing. Patient has history of hypertension but haven't had her blood pressure medication for several months. She does have an appointment with her PCP next week.  Past Medical History:  Diagnosis Date  . Acid reflux   . Asthma   . Bronchitis   . Diabetes mellitus   . Hypertension   . Substance abuse (HCC)    pt denies  . Suicidal thoughts    pt denies    Patient Active Problem List   Diagnosis Date Noted  . Colles' fracture of right radius 03/13/2012    Past Surgical History:  Procedure Laterality Date  . ABDOMINAL HYSTERECTOMY    . CESAREAN SECTION     x 2    OB History    Gravida Para Term Preterm AB Living   5 2 2   3 2    SAB TAB Ectopic Multiple Live Births   2 1             Home Medications    Prior to Admission medications   Medication Sig Start Date End Date Taking? Authorizing Provider  albuterol (PROVENTIL HFA;VENTOLIN HFA) 108 (90 BASE) MCG/ACT inhaler Inhale 2 puffs into the lungs every 6 (six) hours as needed for wheezing.    [provider]  albuterol (PROVENTIL) (2.5 MG/3ML) 0.083% nebulizer solution Take 2.5 mg by nebulization every 6 (six) hours as needed for wheezing or shortness of breath.    [provider]  amLODipine (NORVASC) 5 MG tablet Take 5 mg by mouth daily.    [provider]  aspirin EC 81 MG tablet Take 81 mg by mouth daily.    [provider]  diclofenac (VOLTAREN) 75 MG EC tablet Take 1 tablet (75 mg total) by mouth 2 (two) times daily. Take with food 06/20/17   Triplett, Tammy, PA-C  HYDROcodone-acetaminophen (NORCO/VICODIN) 5-325 MG tablet Take 1-2 tablets by mouth every 4 (four) hours as needed. 03/05/17   Gilda Crease, MD  lisinopril (PRINIVIL,ZESTRIL) 5 MG tablet Take 5 mg by mouth daily.    [provider]  predniSONE (DELTASONE) 20 MG tablet 2 tabs po daily x 3 days 06/21/17   Bethann Berkshire, MD  traMADol (ULTRAM) 50 MG tablet Take 1 tablet (50 mg total) by mouth every 6 (six) hours as needed. 03/05/17   Gilda Crease, MD    Family History Family History  Problem Relation Age of Onset  . Diabetes Other   . Heart disease Unknown   . Arthritis Unknown   . Lung disease Unknown   . Cancer Unknown     Social History Social History   Tobacco Use  . Smoking status: Current Every Day Smoker    Packs/day: 1.00  Years: 25.00    Pack years: 25.00    Types: Cigarettes  . Smokeless tobacco: Never Used  . Tobacco comment: 2-3 packs  Substance Use Topics  . Alcohol use: No    Frequency: Never  . Drug use: No    Comment: pt denies     Allergies   Doxycycline   Review of Systems Review of Systems  Constitutional: Negative for fever.  HENT: Positive for dental problem and ear pain.   Respiratory: Negative for chest tightness and shortness of breath.   Cardiovascular: Negative for chest pain.  Neurological: Positive for headaches.     Physical Exam Updated Vital Signs BP (!) 159/120 (BP Location: Right Arm)   Pulse 87   Temp 98.5 F (36.9 C) (Oral)   Resp 18   Ht 5\' 9"  (1.753 m)   Wt 89.8 kg (198 lb)   SpO2 100%   BMI 29.24 kg/m   Physical Exam  Constitutional: She is oriented to person, place,  and time. She appears well-developed and well-nourished. No distress.  HENT:  Head: Atraumatic.  Ears: Right TM  is obscured due to foreign object (qtip head) in the ear canal. Left TM is normal with normal ear canal  Nose: Normal nares  Throat: Evidence of dental decay, tenderness to left upper and lower gum line knee tooth #14-15, and 18-19 no gingival erythema, no obvious abscess, no facial involvement.   Eyes: Conjunctivae are normal.  Neck: Normal range of motion. Neck supple.  Cardiovascular: Normal rate and regular rhythm.  Pulmonary/Chest: Effort normal and breath sounds normal.  Abdominal: She exhibits no distension. There is no tenderness.  Lymphadenopathy:    She has no cervical adenopathy.  Neurological: She is alert and oriented to person, place, and time.  Skin: No rash noted.  Psychiatric: She has a normal mood and affect.  Nursing note and vitals reviewed.    ED Treatments / Results  Labs (all labs ordered are listed, but only abnormal results are displayed) Labs Reviewed - No data to display  EKG  EKG Interpretation None       Radiology No results found.  Procedures .Foreign Body Removal Date/Time: 11/20/2017 10:38 PM Performed by: Fayrene Helper, PA-C Authorized by: Fayrene Helper, PA-C  Consent: Verbal consent obtained. Risks and benefits: risks, benefits and alternatives were discussed Consent given by: patient Patient understanding: patient states understanding of the procedure being performed Patient consent: the patient's understanding of the procedure matches consent given Patient identity confirmed: verbally with patient Body area: ear Location details: right ear  Sedation: Patient sedated: no  Patient restrained: no Localization method: visualized Removal mechanism: curette Complexity: simple 1 objects recovered. Objects recovered: head of Qtip Post-procedure assessment: foreign body removed Patient tolerance: Patient tolerated the  procedure well with no immediate complications   (including critical care time)  Medications Ordered in ED Medications - No data to display   Initial Impression / Assessment and Plan / ED Course  I have reviewed the triage vital signs and the nursing notes.  Pertinent labs & imaging results that were available during my care of the patient were reviewed by me and considered in my medical decision making (see chart for details).     BP (!) 159/120 (BP Location: Right Arm)   Pulse 87   Temp 98.5 F (36.9 C) (Oral)   Resp 18   Ht 5\' 9"  (1.753 m)   Wt 89.8 kg (198 lb)   SpO2 100%   BMI 29.24 kg/m  Final Clinical Impressions(s) / ED Diagnoses   Final diagnoses:  Pain due to dental caries  Foreign body in auricle of right ear, initial encounter    ED Discharge Orders        Ordered    amLODipine (NORVASC) 5 MG tablet  Daily     11/20/17 2241    lisinopril (PRINIVIL,ZESTRIL) 5 MG tablet  Daily     11/20/17 2241    penicillin v potassium (VEETID) 500 MG tablet  3 times daily     11/20/17 2241    ibuprofen (ADVIL,MOTRIN) 600 MG tablet  Every 6 hours PRN     11/20/17 2241     10:39 PM Patient here with dental pain. Pain is reproducible on exam involving the gum line adjacent to decay teeth. No obvious abscess amenable for drainage. No chest pain or trouble breathing to suggest cardiac pathology. Patient is hypertensive and having been taking her blood pressure medication. Will refill medication.  Incidental finding of achy Q-tip head in her right ear canal. It was successfully removed using an ear curette. Encouraged patient to avoid using Q-tip. No evidence of TM perforation or infection.   Fayrene Helperran, Catherine Oak, PA-C 11/20/17 2242    Margarita Grizzleay, Danielle, MD 11/20/17 [phone]

## 2017-11-20 NOTE — ED Notes (Signed)
PA in room with pt.

## 2017-11-20 NOTE — ED Triage Notes (Signed)
Patient reports Lsided dental pain that started 2 weeks ago. Patient states pain became much worse about an hour ago.

## 2017-11-20 NOTE — ED Notes (Signed)
CBG checked 90, BP rechecked 176/98. Patient states her ears and throat hurt. Patient reports she does not have any of her BP medication.

## 2017-12-26 LAB — BASIC METABOLIC PANEL: GLUCOSE: 89

## 2018-01-08 ENCOUNTER — Emergency Department (HOSPITAL_COMMUNITY)
Admission: EM | Admit: 2018-01-08 | Discharge: 2018-01-08 | Disposition: A | Discharge status: Home / Self Care | Payer: Self-pay

## 2018-01-28 LAB — GLUCOSE, POCT (MANUAL RESULT ENTRY): POC Glucose: 109 mg/dl — AB (ref 70–99)

## 2018-04-29 ENCOUNTER — Other Ambulatory Visit: Payer: Self-pay

## 2018-04-29 ENCOUNTER — Emergency Department (HOSPITAL_COMMUNITY)
Admission: EM | Admit: 2018-04-29 | Discharge: 2018-04-29 | Disposition: A | Discharge status: Home / Self Care | Payer: Self-pay | Attending: Emergency Medicine | Admitting: Emergency Medicine

## 2018-04-29 ENCOUNTER — Encounter (HOSPITAL_COMMUNITY): Payer: Self-pay | Admitting: *Deleted

## 2018-04-29 ENCOUNTER — Emergency Department (HOSPITAL_COMMUNITY): Payer: Self-pay

## 2018-04-29 DIAGNOSIS — Z79899 Other long term (current) drug therapy: Secondary | ICD-10-CM | POA: Insufficient documentation

## 2018-04-29 DIAGNOSIS — Z87821 Personal history of retained foreign body fully removed: Secondary | ICD-10-CM

## 2018-04-29 DIAGNOSIS — Y999 Unspecified external cause status: Secondary | ICD-10-CM | POA: Insufficient documentation

## 2018-04-29 DIAGNOSIS — Z7982 Long term (current) use of aspirin: Secondary | ICD-10-CM | POA: Insufficient documentation

## 2018-04-29 DIAGNOSIS — W208XXA Other cause of strike by thrown, projected or falling object, initial encounter: Secondary | ICD-10-CM | POA: Insufficient documentation

## 2018-04-29 DIAGNOSIS — Y929 Unspecified place or not applicable: Secondary | ICD-10-CM | POA: Insufficient documentation

## 2018-04-29 DIAGNOSIS — F17210 Nicotine dependence, cigarettes, uncomplicated: Secondary | ICD-10-CM | POA: Insufficient documentation

## 2018-04-29 DIAGNOSIS — J45909 Unspecified asthma, uncomplicated: Secondary | ICD-10-CM | POA: Insufficient documentation

## 2018-04-29 DIAGNOSIS — Y9301 Activity, walking, marching and hiking: Secondary | ICD-10-CM | POA: Insufficient documentation

## 2018-04-29 DIAGNOSIS — S91342A Puncture wound with foreign body, left foot, initial encounter: Secondary | ICD-10-CM | POA: Insufficient documentation

## 2018-04-29 DIAGNOSIS — I10 Essential (primary) hypertension: Secondary | ICD-10-CM | POA: Insufficient documentation

## 2018-04-29 DIAGNOSIS — S91311A Laceration without foreign body, right foot, initial encounter: Secondary | ICD-10-CM

## 2018-04-29 DIAGNOSIS — E119 Type 2 diabetes mellitus without complications: Secondary | ICD-10-CM | POA: Insufficient documentation

## 2018-04-29 LAB — CBC
HEMATOCRIT: 42 % (ref 36.0–46.0)
Hemoglobin: 14 g/dL (ref 12.0–15.0)
MCH: 27.3 pg (ref 26.0–34.0)
MCHC: 33.3 g/dL (ref 30.0–36.0)
MCV: 81.9 fL (ref 78.0–100.0)
PLATELETS: 224 10*3/uL (ref 150–400)
RBC: 5.13 MIL/uL — ABNORMAL HIGH (ref 3.87–5.11)
RDW: 15.4 % (ref 11.5–15.5)
WBC: 9.4 10*3/uL (ref 4.0–10.5)

## 2018-04-29 LAB — BASIC METABOLIC PANEL
Anion gap: 7 (ref 5–15)
BUN: 20 mg/dL (ref 6–20)
CALCIUM: 8.9 mg/dL (ref 8.9–10.3)
CO2: 27 mmol/L (ref 22–32)
CREATININE: 0.83 mg/dL (ref 0.44–1.00)
Chloride: 105 mmol/L (ref 98–111)
Glucose, Bld: 83 mg/dL (ref 70–99)
Potassium: 3.7 mmol/L (ref 3.5–5.1)
SODIUM: 139 mmol/L (ref 135–145)

## 2018-04-29 MED ORDER — HYDROCODONE-ACETAMINOPHEN 5-325 MG PO TABS: 1.0000 | ORAL_TABLET | ORAL | 0 refills | Status: DC | PRN

## 2018-04-29 MED ORDER — ONDANSETRON 4 MG PO TBDP: ORAL_TABLET | ORAL | 0 refills | Status: DC

## 2018-04-29 MED ORDER — ONDANSETRON HCL 4 MG/2ML IJ SOLN
4.0000 mg | Freq: Once | INTRAMUSCULAR | Status: AC
  Administered 2018-04-29: 4 mg via INTRAVENOUS

## 2018-04-29 MED ORDER — LIDOCAINE-EPINEPHRINE (PF) 2 %-1:200000 IJ SOLN
20.0000 mL | Freq: Once | INTRAMUSCULAR | Status: AC
  Administered 2018-04-29: 20 mL
  Filled 2018-04-29: qty 20

## 2018-04-29 MED ORDER — ONDANSETRON HCL 4 MG/2ML IJ SOLN
INTRAMUSCULAR | Status: AC
  Administered 2018-04-29: 4 mg via INTRAVENOUS
  Filled 2018-04-29: qty 2

## 2018-04-29 MED ORDER — TETANUS-DIPHTH-ACELL PERTUSSIS 5-2.5-18.5 LF-MCG/0.5 IM SUSP
0.5000 mL | Freq: Once | INTRAMUSCULAR | Status: AC
  Administered 2018-04-29: 0.5 mL via INTRAMUSCULAR
  Filled 2018-04-29: qty 0.5

## 2018-04-29 MED ORDER — POVIDONE-IODINE 10 % EX SOLN
CUTANEOUS | Status: AC
  Filled 2018-04-29: qty 30

## 2018-04-29 MED ORDER — HYDROMORPHONE HCL 1 MG/ML IJ SOLN
1.0000 mg | Freq: Once | INTRAMUSCULAR | Status: AC
  Administered 2018-04-29: 1 mg via INTRAVENOUS
  Filled 2018-04-29: qty 1

## 2018-04-29 MED ORDER — CIPROFLOXACIN HCL 500 MG PO TABS: 500.0000 mg | ORAL_TABLET | Freq: Two times a day (BID) | ORAL | 0 refills | Status: DC

## 2018-04-29 MED ORDER — MORPHINE SULFATE (PF) 4 MG/ML IV SOLN
6.0000 mg | Freq: Once | INTRAVENOUS | Status: AC
  Administered 2018-04-29: 6 mg via INTRAVENOUS
  Filled 2018-04-29: qty 2
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
## 2018-04-29 NOTE — ED Notes (Signed)
Pt reporting nausea from pain meds, MD notified.

## 2018-04-29 NOTE — ED Notes (Signed)
Pt has a foreign object in the left side of foot. Object appears to be metal. States she is losing feeling in her toes. IV established.

## 2018-04-29 NOTE — ED Provider Notes (Signed)
[facility] EMERGENCY DEPARTMENT Provider Note   CSN: [id number] Arrival date & time: 04/29/18  [id number]     History   Chief Complaint Chief Complaint  Patient presents with  . Foreign Body in Skin    HPI Josetta HuddleKimberly A Glaab is a 47 y.o. female.  Patient presents with severe left foot pain with foreign object sticking out of it.  Patient was walking in flip-flops and someone was cutting the lawn and a piece of metal flew into her foot.  No other injuries.  Patient's pretty sure she had a tetanus shot 2 years ago and definitely had one in the 90s.  Pain constant.     Past Medical History:  Diagnosis Date  . Acid reflux   . Asthma   . Bronchitis   . Diabetes mellitus   . Hypertension   . Substance abuse (HCC)    pt denies  . Suicidal thoughts    pt denies    Patient Active Problem List   Diagnosis Date Noted  . Colles' fracture of right radius 03/13/2012    Past Surgical History:  Procedure Laterality Date  . ABDOMINAL HYSTERECTOMY    . CESAREAN SECTION     x 2     OB History    Gravida  5   Para  2   Term  2   Preterm      AB  3   Living  2     SAB  2   TAB  1   Ectopic      Multiple      Live Births               Home Medications    Prior to Admission medications   Medication Sig Start Date End Date Taking? Authorizing Provider  albuterol (PROVENTIL HFA;VENTOLIN HFA) 108 (90 BASE) MCG/ACT inhaler Inhale 2 puffs into the lungs every 6 (six) hours as needed for wheezing.   Yes [provider]  albuterol (PROVENTIL) (2.5 MG/3ML) 0.083% nebulizer solution Take 2.5 mg by nebulization every 6 (six) hours as needed for wheezing or shortness of breath.   Yes [provider]  amLODipine (NORVASC) 5 MG tablet Take 1 tablet (5 mg total) by mouth daily. 11/20/17  Yes Fayrene Helperran, [place], PA-C  aspirin EC 81 MG tablet Take 81 mg by mouth daily.   Yes [provider]  diclofenac (VOLTAREN) 75 MG EC tablet Take 1 tablet (75 mg total)  by mouth 2 (two) times daily. Take with food 06/20/17  Yes Triplett, Tammy, PA-C  lisinopril (PRINIVIL,ZESTRIL) 5 MG tablet Take 1 tablet (5 mg total) by mouth daily. 11/20/17  Yes Fayrene Helperran, [place], PA-C  ciprofloxacin (CIPRO) 500 MG tablet Take 1 tablet (500 mg total) by mouth 2 (two) times daily. One po bid x 7 days 04/29/18   Blane OharaZavitz, Isaah Furry, MD  HYDROcodone-acetaminophen [facility](NORCO) 5-325 MG tablet Take 1-2 tablets by mouth every 4 (four) hours as needed. 04/29/18   Blane OharaZavitz, Mosie Angus, MD  ondansetron (ZOFRAN ODT) 4 MG disintegrating tablet 4mg  ODT q4 hours prn nausea/vomit 04/29/18   Blane OharaZavitz, Kristena Wilhelmi, MD    Family History Family History  Problem Relation Age of Onset  . Diabetes Other   . Heart disease Unknown   . Arthritis Unknown   . Lung disease Unknown   . Cancer Unknown     Social History Social History   Tobacco Use  . Smoking status: Current Every Day Smoker    Packs/day: 1.00    Years:  25.00    Pack years: 25.00    Types: Cigarettes  . Smokeless tobacco: Never Used  . Tobacco comment: 2-3 packs  Substance Use Topics  . Alcohol use: No    Frequency: Never  . Drug use: No    Types: Marijuana    Comment: pt denies     Allergies   Doxycycline   Review of Systems Review of Systems  Constitutional: Negative for chills and fever.  HENT: Negative for congestion.   Eyes: Negative for visual disturbance.  Respiratory: Negative for shortness of breath.   Cardiovascular: Negative for chest pain.  Gastrointestinal: Negative for abdominal pain and vomiting.  Genitourinary: Negative for dysuria and flank pain.  Musculoskeletal: Negative for back pain, neck pain and neck stiffness.  Skin: Positive for wound. Negative for rash.  Neurological: Positive for numbness. Negative for light-headedness and headaches.     Physical Exam Updated Vital Signs BP 120/70   Pulse 79   Temp 98.3 F (36.8 C) (Oral)   Resp 15   Ht 5' 9.5" (1.765 m)   Wt 99.8 kg (220 lb)   SpO2 94%   BMI 32.02  kg/m   Physical Exam  Constitutional: She is oriented to person, place, and time. She appears well-developed and well-nourished.  HENT:  Head: Normocephalic and atraumatic.  Eyes: Conjunctivae are normal. Right eye exhibits no discharge. Left eye exhibits no discharge.  Neck: Neck supple. No tracheal deviation present.  Cardiovascular: Normal rate.  Pulmonary/Chest: Effort normal.  Abdominal: Soft. She exhibits no distension. There is no tenderness. There is no guarding.  Musculoskeletal: She exhibits edema and tenderness.  Patient has approximately 1 cm puncture wound with metal wire sticking out of it no active bleeding to lateral base of left foot near calcaneus.  No active infection signs at this time.  Neurovascularly intact in the left foot.  Pain with any range of motion  Neurological: She is alert and oriented to person, place, and time.  Skin: Skin is warm. No rash noted.  Psychiatric: She has a normal mood and affect.  Nursing note and vitals reviewed.    ED Treatments / Results  Labs (all labs ordered are listed, but only abnormal results are displayed) Labs Reviewed  CBC - Abnormal; Notable for the following components:      Result Value   RBC 5.13 (*)    All other components within normal limits  BASIC METABOLIC PANEL    EKG None  Radiology Dg Foot Complete Left  Result Date: 04/29/2018 CLINICAL DATA:  Foreign body in heel. EXAM: LEFT FOOT - COMPLETE 3+ VIEW COMPARISON:  None. FINDINGS: There is a 6 cm long thick gauge wirelike metallic foreign body penetrating the hindfoot from lateral. There is likely involvement of the calcaneus, but calcaneus view would be optimal for determining bony penetration. No associated fracture. IMPRESSION: 6 cm long metallic foreign body penetrating the lateral hindfoot. There is likely calcaneus penetration, os calcis would be helpful. Electronically Signed   By: Marnee Spring M.D.   On: 04/29/2018 10:49    Procedures .Foreign  Body Removal Date/Time: 04/29/2018 3:07 PM Performed by: Blane Ohara, MD Authorized by: Blane Ohara, MD  Consent: Verbal consent obtained. Consent given by: patient Patient understanding: patient states understanding of the procedure being performed Patient consent: the patient's understanding of the procedure matches consent given Procedure consent: procedure consent matches procedure scheduled Time out: Immediately prior to procedure a "time out" was called to verify the correct patient, procedure, equipment, support  staff and site/side marked as required. Body area: skin General location: lower extremity Location details: left foot Anesthesia: local infiltration  Anesthesia: Local Anesthetic: lidocaine 1% with epinephrine Anesthetic total: 10 mL  Sedation: Patient sedated: no  Patient restrained: no Localization method: visualized Removal mechanism: irrigation Dressing: dressing applied Tendon involvement: none Depth: deep Complexity: complex 1 objects recovered. Objects recovered: metal wire Post-procedure assessment: foreign body removed Patient tolerance: Patient tolerated the procedure well with no immediate complications Comments: Soaked in cleaning solution afterward   (including critical care time)  Medications Ordered in ED Medications  povidone-iodine (BETADINE) 10 % external solution (has no administration in time range)  morphine 4 MG/ML injection 6 mg (6 mg Intravenous Given 04/29/18 1043)  lidocaine-EPINEPHrine (XYLOCAINE W/EPI) 2 %-1:200000 (PF) injection 20 mL (20 mLs Infiltration Given 04/29/18 1217)  HYDROmorphone (DILAUDID) injection 1 mg (1 mg Intravenous Given 04/29/18 1208)  Tdap (BOOSTRIX) injection 0.5 mL (0.5 mLs Intramuscular Given 04/29/18 1210)  ondansetron (ZOFRAN) injection 4 mg (4 mg Intravenous Given 04/29/18 1316)     Initial Impression / Assessment and Plan / ED Course  I have reviewed the triage vital signs and the nursing  notes.  Pertinent labs & imaging results that were available during my care of the patient were reviewed by me and considered in my medical decision making (see chart for details).    Patient presents with metal puncture wound to left foot.  X-ray obtained and reviewed, no fracture to the bones.  Pain meds given IV and locally with lidocaine with epinephrine.  Pain improved in the ER and discussed with orthopedics who agreed with removal in the ED, wash, antibiotics and follow-up as needed in the clinic.  Patient comfortable this plan.  Foreign body removed without difficulty, crutches given for a couple days.  Patient is fairly certain she is had 3 doses of tetanus in her life.  Discussed if she realizes this is not the case we would recommend immunoglobulin, family is in the room for this discussion she will return on Monday if that is the case. Blood work  Results and differential diagnosis were discussed with the patient/parent/guardian. Xrays were independently reviewed by myself.  Close follow up outpatient was discussed, comfortable with the plan.   Medications  povidone-iodine (BETADINE) 10 % external solution (has no administration in time range)  morphine 4 MG/ML injection 6 mg (6 mg Intravenous Given 04/29/18 1043)  lidocaine-EPINEPHrine (XYLOCAINE W/EPI) 2 %-1:200000 (PF) injection 20 mL (20 mLs Infiltration Given 04/29/18 1217)  HYDROmorphone (DILAUDID) injection 1 mg (1 mg Intravenous Given 04/29/18 1208)  Tdap (BOOSTRIX) injection 0.5 mL (0.5 mLs Intramuscular Given 04/29/18 1210)  ondansetron (ZOFRAN) injection 4 mg (4 mg Intravenous Given 04/29/18 1316)    Vitals:   04/29/18 1200 04/29/18 1230 04/29/18 1300 04/29/18 1330  BP: 137/81 125/86 121/83 120/70  Pulse: 86 81 76 79  Resp: 15 15 15 15   Temp:      TempSrc:      SpO2: 98% 98% 97% 94%  Weight:      Height:        Final diagnoses:  H/O retained foreign body fully removed  Foot laceration, right, initial encounter      Final Clinical Impressions(s) / ED Diagnoses   Final diagnoses:  H/O retained foreign body fully removed  Foot laceration, right, initial encounter    ED Discharge Orders        Ordered    HYDROcodone-acetaminophen (NORCO) 5-325 MG tablet  Every 4 hours PRN  04/29/18 1347    ciprofloxacin (CIPRO) 500 MG tablet  2 times daily     04/29/18 1347    ondansetron (ZOFRAN ODT) 4 MG disintegrating tablet     04/29/18 1347       Blane Ohara, MD 04/29/18 [phone]

## 2018-04-29 NOTE — Discharge Instructions (Signed)
Watch for signs of infection such as pus draining, spreading redness or fevers. Take Tylenol and Motrin as needed for pain, use ice as needed. For the next few days soak the wound in warm water with soap or Betadine, no swimming pools or hot tubs until completely healed. Take antibiotics as directed. For severe pain take norco or vicodin however realize they have the potential for addiction and it can make you sleepy and has tylenol in it.  No operating machinery while taking.

## 2018-05-01 ENCOUNTER — Other Ambulatory Visit: Payer: Self-pay

## 2018-05-01 ENCOUNTER — Inpatient Hospital Stay (HOSPITAL_COMMUNITY)
Admission: EM | Admit: 2018-05-01 | Discharge: 2018-05-03 | DRG: 603 | Disposition: A | Discharge status: Home / Self Care | Payer: Self-pay | Attending: Internal Medicine | Admitting: Internal Medicine

## 2018-05-01 ENCOUNTER — Encounter (HOSPITAL_COMMUNITY): Payer: Self-pay | Admitting: *Deleted

## 2018-05-01 ENCOUNTER — Emergency Department (HOSPITAL_COMMUNITY): Payer: Self-pay

## 2018-05-01 DIAGNOSIS — W208XXD Other cause of strike by thrown, projected or falling object, subsequent encounter: Secondary | ICD-10-CM | POA: Diagnosis present

## 2018-05-01 DIAGNOSIS — I10 Essential (primary) hypertension: Secondary | ICD-10-CM | POA: Diagnosis present

## 2018-05-01 DIAGNOSIS — Z7982 Long term (current) use of aspirin: Secondary | ICD-10-CM

## 2018-05-01 DIAGNOSIS — Z881 Allergy status to other antibiotic agents status: Secondary | ICD-10-CM

## 2018-05-01 DIAGNOSIS — E876 Hypokalemia: Secondary | ICD-10-CM | POA: Diagnosis present

## 2018-05-01 DIAGNOSIS — K219 Gastro-esophageal reflux disease without esophagitis: Secondary | ICD-10-CM | POA: Diagnosis present

## 2018-05-01 DIAGNOSIS — S92002D Unspecified fracture of left calcaneus, subsequent encounter for fracture with routine healing: Secondary | ICD-10-CM

## 2018-05-01 DIAGNOSIS — S91332A Puncture wound without foreign body, left foot, initial encounter: Secondary | ICD-10-CM

## 2018-05-01 DIAGNOSIS — K5900 Constipation, unspecified: Secondary | ICD-10-CM | POA: Diagnosis present

## 2018-05-01 DIAGNOSIS — L03116 Cellulitis of left lower limb: Principal | ICD-10-CM | POA: Diagnosis present

## 2018-05-01 DIAGNOSIS — F1410 Cocaine abuse, uncomplicated: Secondary | ICD-10-CM

## 2018-05-01 DIAGNOSIS — F17210 Nicotine dependence, cigarettes, uncomplicated: Secondary | ICD-10-CM | POA: Diagnosis present

## 2018-05-01 DIAGNOSIS — R1084 Generalized abdominal pain: Secondary | ICD-10-CM

## 2018-05-01 DIAGNOSIS — S92002A Unspecified fracture of left calcaneus, initial encounter for closed fracture: Secondary | ICD-10-CM

## 2018-05-01 DIAGNOSIS — Z23 Encounter for immunization: Secondary | ICD-10-CM

## 2018-05-01 DIAGNOSIS — J45909 Unspecified asthma, uncomplicated: Secondary | ICD-10-CM | POA: Diagnosis present

## 2018-05-01 DIAGNOSIS — E119 Type 2 diabetes mellitus without complications: Secondary | ICD-10-CM | POA: Diagnosis present

## 2018-05-01 DIAGNOSIS — Z90710 Acquired absence of both cervix and uterus: Secondary | ICD-10-CM

## 2018-05-01 NOTE — ED Triage Notes (Signed)
Pt brought in by [facility] for altered loc and fever; pt was seen here x 2 days ago for a piece of metal that was lodged in her left heel; pt states she has been vomiting; pt able to state her name, dob and place

## 2018-05-02 ENCOUNTER — Encounter (HOSPITAL_COMMUNITY): Payer: Self-pay | Admitting: Emergency Medicine

## 2018-05-02 ENCOUNTER — Emergency Department (HOSPITAL_COMMUNITY): Payer: Self-pay

## 2018-05-02 ENCOUNTER — Other Ambulatory Visit: Payer: Self-pay

## 2018-05-02 ENCOUNTER — Inpatient Hospital Stay (HOSPITAL_COMMUNITY): Payer: Self-pay

## 2018-05-02 DIAGNOSIS — L03116 Cellulitis of left lower limb: Secondary | ICD-10-CM | POA: Diagnosis present

## 2018-05-02 DIAGNOSIS — R1084 Generalized abdominal pain: Secondary | ICD-10-CM

## 2018-05-02 DIAGNOSIS — E876 Hypokalemia: Secondary | ICD-10-CM

## 2018-05-02 LAB — URINALYSIS, ROUTINE W REFLEX MICROSCOPIC
BACTERIA UA: NONE SEEN
BILIRUBIN URINE: NEGATIVE
Glucose, UA: NEGATIVE mg/dL
HGB URINE DIPSTICK: NEGATIVE
Ketones, ur: NEGATIVE mg/dL
NITRITE: NEGATIVE
PH: 5 (ref 5.0–8.0)
Protein, ur: NEGATIVE mg/dL
SPECIFIC GRAVITY, URINE: 1.018 (ref 1.005–1.030)

## 2018-05-02 LAB — COMPREHENSIVE METABOLIC PANEL
ALK PHOS: 92 U/L (ref 38–126)
ALT: 31 U/L (ref 0–44)
ALT: 44 U/L (ref 0–44)
ANION GAP: 6 (ref 5–15)
ANION GAP: 9 (ref 5–15)
AST: 21 U/L (ref 15–41)
AST: 42 U/L — ABNORMAL HIGH (ref 15–41)
Albumin: 3 g/dL — ABNORMAL LOW (ref 3.5–5.0)
Albumin: 3.9 g/dL (ref 3.5–5.0)
Alkaline Phosphatase: 76 U/L (ref 38–126)
BUN: 15 mg/dL (ref 6–20)
BUN: 9 mg/dL (ref 6–20)
CALCIUM: 9 mg/dL (ref 8.9–10.3)
CO2: 22 mmol/L (ref 22–32)
CO2: 23 mmol/L (ref 22–32)
Calcium: 7.5 mg/dL — ABNORMAL LOW (ref 8.9–10.3)
Chloride: 106 mmol/L (ref 98–111)
Chloride: 112 mmol/L — ABNORMAL HIGH (ref 98–111)
Creatinine, Ser: 0.76 mg/dL (ref 0.44–1.00)
Creatinine, Ser: 0.97 mg/dL (ref 0.44–1.00)
GFR calc Af Amer: >60 mL/min (ref >60)
GFR calc non Af Amer: >60 mL/min (ref >60)
GFR calc non Af Amer: >60 mL/min (ref >60)
GLUCOSE: 137 mg/dL — AB (ref 70–99)
Glucose, Bld: 109 mg/dL — ABNORMAL HIGH (ref 70–99)
Potassium: 3.1 mmol/L — ABNORMAL LOW (ref 3.5–5.1)
Potassium: 3.3 mmol/L — ABNORMAL LOW (ref 3.5–5.1)
SODIUM: 138 mmol/L (ref 135–145)
Sodium: 140 mmol/L (ref 135–145)
TOTAL PROTEIN: 6.4 g/dL — AB (ref 6.5–8.1)
Total Bilirubin: 0.8 mg/dL (ref 0.3–1.2)
Total Bilirubin: 1.2 mg/dL (ref 0.3–1.2)
Total Protein: 8.1 g/dL (ref 6.5–8.1)

## 2018-05-02 LAB — CBC WITH DIFFERENTIAL/PLATELET
BASOS PCT: 0 %
Basophils Absolute: 0.1 10*3/uL (ref 0.0–0.1)
EOS ABS: 0.1 10*3/uL (ref 0.0–0.7)
EOS PCT: 1 %
HCT: 40.2 % (ref 36.0–46.0)
Hemoglobin: 13.6 g/dL (ref 12.0–15.0)
LYMPHS ABS: 2.8 10*3/uL (ref 0.7–4.0)
Lymphocytes Relative: 22 %
MCH: 27.7 pg (ref 26.0–34.0)
MCHC: 33.8 g/dL (ref 30.0–36.0)
MCV: 81.9 fL (ref 78.0–100.0)
MONO ABS: 1.5 10*3/uL — AB (ref 0.1–1.0)
MONOS PCT: 12 %
NEUTROS PCT: 65 %
Neutro Abs: 8.1 10*3/uL — ABNORMAL HIGH (ref 1.7–7.7)
Platelets: 214 10*3/uL (ref 150–400)
RBC: 4.91 MIL/uL (ref 3.87–5.11)
RDW: 15.6 % — AB (ref 11.5–15.5)
WBC: 12.5 10*3/uL — ABNORMAL HIGH (ref 4.0–10.5)

## 2018-05-02 LAB — RAPID URINE DRUG SCREEN, HOSP PERFORMED
Amphetamines: NOT DETECTED
BENZODIAZEPINES: NOT DETECTED
Cocaine: POSITIVE — AB
Opiates: POSITIVE — AB
Tetrahydrocannabinol: NOT DETECTED

## 2018-05-02 LAB — SALICYLATE LEVEL: Salicylate Lvl: <7 mg/dL (ref 2.8–30.0)

## 2018-05-02 LAB — CG4 I-STAT (LACTIC ACID): Lactic Acid, Venous: 2.62 mmol/L (ref 0.5–1.9)

## 2018-05-02 LAB — AMMONIA: AMMONIA: 42 umol/L — AB (ref 9–35)

## 2018-05-02 LAB — ACETAMINOPHEN LEVEL: Acetaminophen (Tylenol), Serum: <10 ug/mL — ABNORMAL LOW (ref 10–30)

## 2018-05-02 LAB — I-STAT BETA HCG BLOOD, ED (MC, WL, AP ONLY)

## 2018-05-02 LAB — CBC
HCT: 34.7 % — ABNORMAL LOW (ref 36.0–46.0)
Hemoglobin: 11.5 g/dL — ABNORMAL LOW (ref 12.0–15.0)
MCH: 27.5 pg (ref 26.0–34.0)
MCHC: 33.1 g/dL (ref 30.0–36.0)
MCV: 83 fL (ref 78.0–100.0)
PLATELETS: 197 10*3/uL (ref 150–400)
RBC: 4.18 MIL/uL (ref 3.87–5.11)
RDW: 15.8 % — AB (ref 11.5–15.5)
WBC: 9.3 10*3/uL (ref 4.0–10.5)

## 2018-05-02 LAB — PROTIME-INR
INR: 1.06
PROTHROMBIN TIME: 13.7 s (ref 11.4–15.2)

## 2018-05-02 LAB — LACTIC ACID, PLASMA: Lactic Acid, Venous: 1.9 mmol/L (ref 0.5–1.9)

## 2018-05-02 LAB — I-STAT CG4 LACTIC ACID, ED: LACTIC ACID, VENOUS: 2.17 mmol/L — AB (ref 0.5–1.9)

## 2018-05-02 LAB — ETHANOL

## 2018-05-02 MED ORDER — ONDANSETRON HCL 4 MG PO TABS: 4.0000 mg | ORAL_TABLET | Freq: Four times a day (QID) | ORAL | Status: DC | PRN

## 2018-05-02 MED ORDER — VANCOMYCIN HCL IN DEXTROSE 1-5 GM/200ML-% IV SOLN
1000.0000 mg | Freq: Three times a day (TID) | INTRAVENOUS | Status: DC
  Administered 2018-05-02 – 2018-05-03 (×4): 1000 mg via INTRAVENOUS
  Filled 2018-05-02 (×4): qty 200

## 2018-05-02 MED ORDER — NICOTINE 14 MG/24HR TD PT24
14.0000 mg | MEDICATED_PATCH | Freq: Every day | TRANSDERMAL | Status: DC
  Administered 2018-05-02 – 2018-05-03 (×2): 14 mg via TRANSDERMAL
  Filled 2018-05-02 (×2): qty 1

## 2018-05-02 MED ORDER — PIPERACILLIN-TAZOBACTAM 3.375 G IVPB 30 MIN
3.3750 g | Freq: Once | INTRAVENOUS | Status: AC
  Administered 2018-05-02: 3.375 g via INTRAVENOUS
  Filled 2018-05-02: qty 50

## 2018-05-02 MED ORDER — ENOXAPARIN SODIUM 40 MG/0.4ML ~~LOC~~ SOLN
40.0000 mg | SUBCUTANEOUS | Status: DC
  Administered 2018-05-02 – 2018-05-03 (×2): 40 mg via SUBCUTANEOUS
  Filled 2018-05-02 (×2): qty 0.4

## 2018-05-02 MED ORDER — ALBUTEROL SULFATE (2.5 MG/3ML) 0.083% IN NEBU: 3.0000 mL | INHALATION_SOLUTION | Freq: Four times a day (QID) | RESPIRATORY_TRACT | Status: DC | PRN

## 2018-05-02 MED ORDER — ACETAMINOPHEN 650 MG RE SUPP: 650.0000 mg | Freq: Four times a day (QID) | RECTAL | Status: DC | PRN

## 2018-05-02 MED ORDER — POTASSIUM CHLORIDE CRYS ER 20 MEQ PO TBCR
40.0000 meq | EXTENDED_RELEASE_TABLET | Freq: Once | ORAL | Status: AC
  Administered 2018-05-02: 40 meq via ORAL
  Filled 2018-05-02: qty 2

## 2018-05-02 MED ORDER — VANCOMYCIN HCL IN DEXTROSE 1-5 GM/200ML-% IV SOLN
1000.0000 mg | Freq: Once | INTRAVENOUS | Status: AC
  Administered 2018-05-02: 1000 mg via INTRAVENOUS
  Filled 2018-05-02: qty 200

## 2018-05-02 MED ORDER — AMLODIPINE BESYLATE 5 MG PO TABS
5.0000 mg | ORAL_TABLET | Freq: Every day | ORAL | Status: DC
  Administered 2018-05-02 – 2018-05-03 (×2): 5 mg via ORAL
  Filled 2018-05-02 (×2): qty 1

## 2018-05-02 MED ORDER — LISINOPRIL 5 MG PO TABS
5.0000 mg | ORAL_TABLET | Freq: Every day | ORAL | Status: DC
  Administered 2018-05-02 – 2018-05-03 (×2): 5 mg via ORAL
  Filled 2018-05-02 (×2): qty 1

## 2018-05-02 MED ORDER — POTASSIUM CHLORIDE 10 MEQ/100ML IV SOLN
10.0000 meq | INTRAVENOUS | Status: AC
  Administered 2018-05-02 (×3): 10 meq via INTRAVENOUS
  Filled 2018-05-02 (×3): qty 100

## 2018-05-02 MED ORDER — SODIUM CHLORIDE 0.9 % IV BOLUS (SEPSIS)
1000.0000 mL | Freq: Once | INTRAVENOUS | Status: AC
  Administered 2018-05-02: 1000 mL via INTRAVENOUS

## 2018-05-02 MED ORDER — SODIUM CHLORIDE 0.9 % IV SOLN
INTRAVENOUS | Status: DC
  Administered 2018-05-02: 08:00:00 via INTRAVENOUS

## 2018-05-02 MED ORDER — ACETAMINOPHEN 325 MG PO TABS: 650.0000 mg | ORAL_TABLET | Freq: Four times a day (QID) | ORAL | Status: DC | PRN

## 2018-05-02 MED ORDER — IOPAMIDOL (ISOVUE-300) INJECTION 61%
100.0000 mL | Freq: Once | INTRAVENOUS | Status: AC | PRN
  Administered 2018-05-02: 100 mL via INTRAVENOUS

## 2018-05-02 MED ORDER — PIPERACILLIN-TAZOBACTAM 3.375 G IVPB
3.3750 g | Freq: Three times a day (TID) | INTRAVENOUS | Status: DC
  Administered 2018-05-02 – 2018-05-03 (×4): 3.375 g via INTRAVENOUS
  Filled 2018-05-02 (×4): qty 50

## 2018-05-02 MED ORDER — ONDANSETRON HCL 4 MG/2ML IJ SOLN: 4.0000 mg | Freq: Four times a day (QID) | INTRAMUSCULAR | Status: DC | PRN

## 2018-05-02 MED ORDER — FENTANYL CITRATE (PF) 100 MCG/2ML IJ SOLN
25.0000 ug | Freq: Once | INTRAMUSCULAR | Status: AC
  Administered 2018-05-02: 25 ug via INTRAVENOUS
  Filled 2018-05-02: qty 2

## 2018-05-02 MED ORDER — HYDROMORPHONE HCL 1 MG/ML IJ SOLN
1.0000 mg | INTRAMUSCULAR | Status: DC | PRN
  Administered 2018-05-02 – 2018-05-03 (×5): 1 mg via INTRAVENOUS
  Filled 2018-05-02 (×6): qty 1

## 2018-05-02 MED ORDER — ASPIRIN EC 81 MG PO TBEC
81.0000 mg | DELAYED_RELEASE_TABLET | Freq: Every day | ORAL | Status: DC
  Administered 2018-05-02 – 2018-05-03 (×2): 81 mg via ORAL
  Filled 2018-05-02 (×2): qty 1

## 2018-05-02 NOTE — ED Notes (Signed)
Pt given peanut butter, crackers and ginger ale 

## 2018-05-02 NOTE — Progress Notes (Signed)
Patient seen and examined. Admitted after midnight secondary to sepsis in the setting of left foot cellulitis. Patient is afebrile and in no distress currently. VSS. Will continue IV antibiotics as prescribed on admission and follow foot MRI/cultures results. Please refer to H&P written by Dr. Sharl MaLama for further info/details on admission.  Vassie Lollarlos Bannie Lobban MD [phone]

## 2018-05-02 NOTE — ED Provider Notes (Signed)
[facility] EMERGENCY DEPARTMENT Provider Note   CSN: [id number] Arrival date & time: 05/01/18  2329     History   Chief Complaint Chief Complaint  Patient presents with  . Abdominal Pain   Pt seen with NP student, I performed history/physical/documentation  Level 5 caveat due to altered mental status  HPI ELLEANOR Good is a 47 y.o. female.  The history is provided by the patient, a relative and a parent. The history is limited by the condition of the patient.  Abdominal Pain   This is a new problem. The current episode started 12 to 24 hours ago. The problem occurs constantly. The problem has been gradually worsening. The pain is located in the generalized abdominal region. The pain is severe. Associated symptoms include vomiting. The symptoms are aggravated by palpation. Nothing relieves the symptoms.   Patient with history of asthma, diabetes presents for multiple complaints.  She was seen on June 29 for an injury to her left foot where a piece of metal was lodged in her foot.  She had it extracted, but has not been taking her antibiotics.  Over the past day she is been having diffuse abdominal pain with nausea vomiting.  She is also been acting more confused per family. Past Medical History:  Diagnosis Date  . Acid reflux   . Asthma   . Bronchitis   . Diabetes mellitus   . Hypertension   . Substance abuse (HCC)    pt denies  . Suicidal thoughts    pt denies    Patient Active Problem List   Diagnosis Date Noted  . Colles' fracture of right radius 03/13/2012    Past Surgical History:  Procedure Laterality Date  . ABDOMINAL HYSTERECTOMY    . CESAREAN SECTION     x 2     OB History    Gravida  5   Para  2   Term  2   Preterm      AB  3   Living  2     SAB  2   TAB  1   Ectopic      Multiple      Live Births               Home Medications    Prior to Admission medications   Medication Sig Start Date End Date Taking? Authorizing  Provider  albuterol (PROVENTIL HFA;VENTOLIN HFA) 108 (90 BASE) MCG/ACT inhaler Inhale 2 puffs into the lungs every 6 (six) hours as needed for wheezing.    [provider]  albuterol (PROVENTIL) (2.5 MG/3ML) 0.083% nebulizer solution Take 2.5 mg by nebulization every 6 (six) hours as needed for wheezing or shortness of breath.    [provider]  amLODipine (NORVASC) 5 MG tablet Take 1 tablet (5 mg total) by mouth daily. 11/20/17   Fayrene Helper, PA-C  aspirin EC 81 MG tablet Take 81 mg by mouth daily.    [provider]  ciprofloxacin (CIPRO) 500 MG tablet Take 1 tablet (500 mg total) by mouth 2 (two) times daily. One po bid x 7 days 04/29/18   Blane Ohara, MD  diclofenac (VOLTAREN) 75 MG EC tablet Take 1 tablet (75 mg total) by mouth 2 (two) times daily. Take with food 06/20/17   Triplett, Tammy, PA-C  HYDROcodone-acetaminophen (NORCO) 5-325 MG tablet Take 1-2 tablets by mouth every 4 (four) hours as needed. 04/29/18   Blane Ohara, MD  lisinopril (PRINIVIL,ZESTRIL) 5 MG tablet Take 1  tablet (5 mg total) by mouth daily. 11/20/17   Fayrene Helper, PA-C  ondansetron (ZOFRAN ODT) 4 MG disintegrating tablet 4mg  ODT q4 hours prn nausea/vomit 04/29/18   Blane Ohara, MD    Family History Family History  Problem Relation Age of Onset  . Diabetes Other   . Heart disease Unknown   . Arthritis Unknown   . Lung disease Unknown   . Cancer Unknown     Social History Social History   Tobacco Use  . Smoking status: Current Every Day Smoker    Packs/day: 1.00    Years: 25.00    Pack years: 25.00    Types: Cigarettes  . Smokeless tobacco: Never Used  . Tobacco comment: 2-3 packs  Substance Use Topics  . Alcohol use: No    Frequency: Never  . Drug use: No    Types: Marijuana    Comment: pt denies     Allergies   Doxycycline   Review of Systems Review of Systems  Unable to perform ROS: Mental status change  Gastrointestinal: Positive for abdominal pain and  vomiting.     Physical Exam Updated Vital Signs BP 108/80   Pulse 80   Temp 99.1 F (37.3 C) (Rectal)   Resp 18   Ht 1.765 m (5' 9.5")   Wt 99.8 kg (220 lb)   SpO2 100%   BMI 32.02 kg/m   Physical Exam CONSTITUTIONAL: Disheveled, appears confused HEAD: Normocephalic/atraumatic EYES: EOMI/PERRL ENMT: Mucous membranes moist NECK: supple no meningeal signs SPINE/BACK:entire spine nontender CV: S1/S2 noted, no murmurs/rubs/gallops noted LUNGS: Lungs are clear to auscultation bilaterally, no apparent distress ABDOMEN: soft, diffuse significant abdominal tenderness, guarding noted GU:no cva tenderness NEURO: Pt is awake/alert, moves all extremitiesx4.  No facial droop.  She answers questions appropriately, but she appears very distracted EXTREMITIES: pulses normal/equal, full ROM, tenderness and erythema to left foot.  No crepitus.  See photo SKIN: warm, color normal PSYCH: Able to assess      ED Treatments / Results  Labs (all labs ordered are listed, but only abnormal results are displayed) Labs Reviewed  COMPREHENSIVE METABOLIC PANEL - Abnormal; Notable for the following components:      Result Value   Potassium 3.1 (*)    Glucose, Bld 109 (*)    AST 42 (*)    All other components within normal limits  CBC WITH DIFFERENTIAL/PLATELET - Abnormal; Notable for the following components:   WBC 12.5 (*)    RDW 15.6 (*)    Neutro Abs 8.1 (*)    Monocytes Absolute 1.5 (*)    All other components within normal limits  URINALYSIS, ROUTINE W REFLEX MICROSCOPIC - Abnormal; Notable for the following components:   APPearance HAZY (*)    Leukocytes, UA TRACE (*)    All other components within normal limits  AMMONIA - Abnormal; Notable for the following components:   Ammonia 42 (*)    All other components within normal limits  RAPID URINE DRUG SCREEN, HOSP PERFORMED - Abnormal; Notable for the following components:   Opiates POSITIVE (*)    Cocaine POSITIVE (*)     Barbiturates   (*)    Value: Result not available. Reagent lot number recalled by manufacturer.   All other components within normal limits  ACETAMINOPHEN LEVEL - Abnormal; Notable for the following components:   Acetaminophen (Tylenol), Serum <10 (*)    All other components within normal limits  I-STAT CG4 LACTIC ACID, ED - Abnormal; Notable for the following components:  Lactic Acid, Venous 2.17 (*)    All other components within normal limits  CULTURE, BLOOD (ROUTINE X 2)  CULTURE, BLOOD (ROUTINE X 2)  PROTIME-INR  LACTIC ACID, PLASMA  ETHANOL  SALICYLATE LEVEL  I-STAT BETA HCG BLOOD, ED ([facility], [facility], [facility] ONLY)    EKG None  Radiology Dg Chest 2 View  Result Date: 05/02/2018 CLINICAL DATA:  Code sepsis EXAM: CHEST - 2 VIEW COMPARISON:  04/12/2016 FINDINGS: Increased interstitial opacity. No focal consolidation or effusion. Normal heart size. No pneumothorax. IMPRESSION: Increased interstitial opacity may be due to reactive airways or bronchial inflammation. No focal pneumonia is seen. Electronically Signed   By: Jasmine Pang M.D.   On: 05/02/2018 02:11   Ct Abdomen Pelvis W Contrast  Result Date: 05/02/2018 CLINICAL DATA:  Altered mental status and fever EXAM: CT ABDOMEN AND PELVIS WITH CONTRAST TECHNIQUE: Multidetector CT imaging of the abdomen and pelvis was performed using the standard protocol following bolus administration of intravenous contrast. CONTRAST:  ISOVUE-300 IOPAMIDOL (ISOVUE-300) INJECTION 61% COMPARISON:  CT abdomen pelvis 11/22/2015 FINDINGS: LOWER CHEST: No basilar pulmonary nodules or pleural effusion. No apical pericardial effusion. HEPATOBILIARY: Normal hepatic contours and density. No intra- or extrahepatic biliary dilatation. Normal gallbladder. PANCREAS: Normal parenchymal contours without ductal dilatation. No peripancreatic fluid collection. SPLEEN: Normal. ADRENALS/URINARY TRACT: --Adrenal glands: Normal. --Right kidney/ureter: No hydronephrosis,  nephroureterolithiasis, perinephric stranding or solid renal mass. --Left kidney/ureter: No hydronephrosis, nephroureterolithiasis, perinephric stranding or solid renal mass. --Urinary bladder: Normal for degree of distention STOMACH/BOWEL: --Stomach/Duodenum: No hiatal hernia or other gastric abnormality. Normal duodenal course. --Small bowel: No dilatation or inflammation. --Colon: No focal abnormality. --Appendix: Normal. VASCULAR/LYMPHATIC: Normal course and caliber of the major abdominal vessels. No abdominal or pelvic lymphadenopathy. REPRODUCTIVE: Status post hysterectomy. No adnexal mass. MUSCULOSKELETAL. No bony spinal canal stenosis or focal osseous abnormality. OTHER: None. IMPRESSION: No acute abnormality of the abdomen or pelvis. Electronically Signed   By: Deatra Robinson M.D.   On: 05/02/2018 02:28   Dg Foot Complete Left  Result Date: 05/02/2018 CLINICAL DATA:  Left foot run over by lawnmower, abrasions EXAM: LEFT FOOT - COMPLETE 3+ VIEW COMPARISON:  04/29/2018 FINDINGS: No fracture or malalignment. No periostitis. No radiopaque foreign body. No soft tissue gas. IMPRESSION: No acute osseous abnormality. Electronically Signed   By: Jasmine Pang M.D.   On: 05/02/2018 02:13    Procedures Procedures   Medications Ordered in ED Medications  sodium chloride 0.9 % bolus 1,000 mL (1,000 mLs Intravenous New Bag/Given 05/02/18 0211)    And  sodium chloride 0.9 % bolus 1,000 mL (1,000 mLs Intravenous New Bag/Given 05/02/18 0322)    And  sodium chloride 0.9 % bolus 1,000 mL (has no administration in time range)  fentaNYL (SUBLIMAZE) injection 25 mcg (has no administration in time range)  piperacillin-tazobactam (ZOSYN) IVPB 3.375 g (0 g Intravenous Stopped 05/02/18 0241)  vancomycin (VANCOCIN) IVPB 1000 mg/200 mL premix (0 mg Intravenous Stopped 05/02/18 0311)  iopamidol (ISOVUE-300) 61 % injection 100 mL (100 mLs Intravenous Contrast Given 05/02/18 0150)     Initial Impression / Assessment and Plan  / ED Course  I have reviewed the triage vital signs and the nursing notes.  Pertinent labs & imaging results that were available during my care of the patient were reviewed by me and considered in my medical decision making (see chart for details).     4:07 AM Patient presented with fever and altered mental status.  She recently sustained a penetrating wound to the left  foot, and admits to not taking her antibiotics.  She has signs of cellulitis in the foot.  There is no crepitus in the foot. She also has had significant abdominal pain, but fortunately her CT imaging is negative We will admit for cellulitis to foot, may need MRI of foot to rule out osteomyelitis. For her altered mental status, could be related to the infection, but she is also been found to be cocaine positive and her urine drug screen She currently has no signs of meningitis as  neck is supple without meningeal signs  Discussed with Dr. Sharl MaLama for admission Final Clinical Impressions(s) / ED Diagnoses   Final diagnoses:  Generalized abdominal pain  Puncture wound of left foot with complication, initial encounter  Cellulitis of left foot    ED Discharge Orders    None       Zadie RhineWickline, Teighan Aubert, MD 05/02/18 0410

## 2018-05-02 NOTE — H&P (Signed)
TRH H&P    Patient Demographics:    Katrina Good, is a 47 y.o. female  MRN: [id number]  DOB - 15-Aug-1971  Admit Date - 05/01/2018  Referring MD/NP/PA: Dr. Bebe Shaggy  Outpatient Primary MD for the patient is Muse, Verdell Face., PA-C  Patient coming from: Home  Chief complaint-abdominal pain   HPI:    Katrina Good  is a 47 y.o. female, with history of  Hypertension, asthma, who came to hospital with abdominal pain.  Patient was seen in the ED on June 29 with injury to her left foot where a piece of metal was lodged in her heel.  It was extracted was discharged on antibiotics.  Patient did not take her antibiotics and today came to hospital with confusion and abdominal pain. In the ED, lab work showed lactic acid of 2.17, potassium 3.1, WBC 12.5 CT of the abdomen pelvis showed no acute abnormality. Patient started on vancomycin and Zosyn for left foot cellulitis.   She denies chest pain or shortness of breath. No fever or chills. Denies dysuria.    Review of systems:      All other systems reviewed and are negative.   With Past History of the following :    Past Medical History:  Diagnosis Date  . Acid reflux   . Asthma   . Bronchitis   . Diabetes mellitus   . Hypertension   . Substance abuse (HCC)    pt denies  . Suicidal thoughts    pt denies      Past Surgical History:  Procedure Laterality Date  . ABDOMINAL HYSTERECTOMY    . CESAREAN SECTION     x 2      Social History:      Social History   Tobacco Use  . Smoking status: Current Every Day Smoker    Packs/day: 1.00    Years: 25.00    Pack years: 25.00    Types: Cigarettes  . Smokeless tobacco: Never Used  . Tobacco comment: 2-3 packs  Substance Use Topics  . Alcohol use: No    Frequency: Never       Family History :     Family History  Problem Relation Age of Onset  . Diabetes Other   . Heart disease Unknown    . Arthritis Unknown   . Lung disease Unknown   . Cancer Unknown       Home Medications:   Prior to Admission medications   Medication Sig Start Date End Date Taking? Authorizing Provider  albuterol (PROVENTIL HFA;VENTOLIN HFA) 108 (90 BASE) MCG/ACT inhaler Inhale 2 puffs into the lungs every 6 (six) hours as needed for wheezing.    [provider]  albuterol (PROVENTIL) (2.5 MG/3ML) 0.083% nebulizer solution Take 2.5 mg by nebulization every 6 (six) hours as needed for wheezing or shortness of breath.    [provider]  amLODipine (NORVASC) 5 MG tablet Take 1 tablet (5 mg total) by mouth daily. 11/20/17   Fayrene Helper, PA-C  aspirin EC 81 MG tablet Take 81 mg  by mouth daily.    [provider]  ciprofloxacin (CIPRO) 500 MG tablet Take 1 tablet (500 mg total) by mouth 2 (two) times daily. One po bid x 7 days 04/29/18   Blane OharaZavitz, Joshua, MD  diclofenac (VOLTAREN) 75 MG EC tablet Take 1 tablet (75 mg total) by mouth 2 (two) times daily. Take with food 06/20/17   Triplett, Tammy, PA-C  HYDROcodone-acetaminophen (NORCO) 5-325 MG tablet Take 1-2 tablets by mouth every 4 (four) hours as needed. 04/29/18   Blane OharaZavitz, Joshua, MD  lisinopril (PRINIVIL,ZESTRIL) 5 MG tablet Take 1 tablet (5 mg total) by mouth daily. 11/20/17   Fayrene Helperran, Bowie, PA-C  ondansetron (ZOFRAN ODT) 4 MG disintegrating tablet 4mg  ODT q4 hours prn nausea/vomit 04/29/18   Blane OharaZavitz, Joshua, MD     Allergies:     Allergies  Allergen Reactions  . Doxycycline Hives     Physical Exam:   Vitals  Blood pressure 108/80, pulse 80, temperature 99.1 F (37.3 C), temperature source Rectal, resp. rate 18, height 5' 9.5" (1.765 m), weight 99.8 kg (220 lb), SpO2 100 %.  1.  General: Appears in no acute distress  2. Psychiatric:  Intact judgement and  insight, awake alert, oriented x 3.  3. Neurologic: No focal neurological deficits, all cranial nerves intact.Strength 5/5 all 4 extremities, sensation intact all 4  extremities, plantars down going.  4. Eyes :  anicteric sclerae, moist conjunctivae with no lid lag. PERRLA.  5. ENMT:  Oropharynx clear with moist mucous membranes and good dentition  6. Neck:  supple, no cervical lymphadenopathy appriciated, No thyromegaly  7. Respiratory : Normal respiratory effort, good air movement bilaterally,clear to  auscultation bilaterally  8. Cardiovascular : RRR, no gallops, rubs or murmurs, no leg edema  9. Gastrointestinal:  Positive bowel sounds, abdomen soft, non-tender to palpation,no hepatosplenomegaly, no rigidity or guarding       10. Skin:  Tenderness, warmth, erythema noted at lateral aspect of left foot.      Data Review:    CBC Recent Labs  Lab 04/29/18 1032 05/02/18 0001  WBC 9.4 12.5*  HGB 14.0 13.6  HCT 42.0 40.2  PLT 224 214  MCV 81.9 81.9  MCH 27.3 27.7  MCHC 33.3 33.8  RDW 15.4 15.6*  LYMPHSABS  --  2.8  MONOABS  --  1.5*  EOSABS  --  0.1  BASOSABS  --  0.1   ------------------------------------------------------------------------------------------------------------------  Chemistries  Recent Labs  Lab 04/29/18 1032 05/02/18 0001  NA 139 138  K 3.7 3.1*  CL 105 106  CO2 27 23  GLUCOSE 83 109*  BUN 20 15  CREATININE 0.83 0.97  CALCIUM 8.9 9.0  AST  --  42*  ALT  --  44  ALKPHOS  --  92  BILITOT  --  1.2   ------------------------------------------------------------------------------------------------------------------  ------------------------------------------------------------------------------------------------------------------ GFR: Estimated Creatinine Clearance: 91 mL/min (by C-G formula based on SCr of 0.97 mg/dL). Liver Function Tests: Recent Labs  Lab 05/02/18 0001  AST 42*  ALT 44  ALKPHOS 92  BILITOT 1.2  PROT 8.1  ALBUMIN 3.9   No results for input(s): LIPASE, AMYLASE in the last 168 hours. Recent Labs  Lab 05/02/18 0001  AMMONIA 42*   Coagulation Profile: Recent Labs    Lab 05/02/18 0001  INR 1.06     --------------------------------------------------------------------------------------------------------------- Urine analysis:    Component Value Date/Time   COLORURINE YELLOW 05/02/2018 0015   APPEARANCEUR HAZY (A) 05/02/2018 0015   LABSPEC 1.018 05/02/2018 0015   PHURINE  5.0 05/02/2018 0015   GLUCOSEU NEGATIVE 05/02/2018 0015   HGBUR NEGATIVE 05/02/2018 0015   BILIRUBINUR NEGATIVE 05/02/2018 0015   KETONESUR NEGATIVE 05/02/2018 0015   PROTEINUR NEGATIVE 05/02/2018 0015   UROBILINOGEN 0.2 07/24/2015 1234   NITRITE NEGATIVE 05/02/2018 0015   LEUKOCYTESUR TRACE (A) 05/02/2018 0015      Imaging Results:    Dg Chest 2 View  Result Date: 05/02/2018 CLINICAL DATA:  Code sepsis EXAM: CHEST - 2 VIEW COMPARISON:  04/12/2016 FINDINGS: Increased interstitial opacity. No focal consolidation or effusion. Normal heart size. No pneumothorax. IMPRESSION: Increased interstitial opacity may be due to reactive airways or bronchial inflammation. No focal pneumonia is seen. Electronically Signed   By: Jasmine Pang M.D.   On: 05/02/2018 02:11   Ct Abdomen Pelvis W Contrast  Result Date: 05/02/2018 CLINICAL DATA:  Altered mental status and fever EXAM: CT ABDOMEN AND PELVIS WITH CONTRAST TECHNIQUE: Multidetector CT imaging of the abdomen and pelvis was performed using the standard protocol following bolus administration of intravenous contrast. CONTRAST:  ISOVUE-300 IOPAMIDOL (ISOVUE-300) INJECTION 61% COMPARISON:  CT abdomen pelvis 11/22/2015 FINDINGS: LOWER CHEST: No basilar pulmonary nodules or pleural effusion. No apical pericardial effusion. HEPATOBILIARY: Normal hepatic contours and density. No intra- or extrahepatic biliary dilatation. Normal gallbladder. PANCREAS: Normal parenchymal contours without ductal dilatation. No peripancreatic fluid collection. SPLEEN: Normal. ADRENALS/URINARY TRACT: --Adrenal glands: Normal. --Right kidney/ureter: No hydronephrosis,  nephroureterolithiasis, perinephric stranding or solid renal mass. --Left kidney/ureter: No hydronephrosis, nephroureterolithiasis, perinephric stranding or solid renal mass. --Urinary bladder: Normal for degree of distention STOMACH/BOWEL: --Stomach/Duodenum: No hiatal hernia or other gastric abnormality. Normal duodenal course. --Small bowel: No dilatation or inflammation. --Colon: No focal abnormality. --Appendix: Normal. VASCULAR/LYMPHATIC: Normal course and caliber of the major abdominal vessels. No abdominal or pelvic lymphadenopathy. REPRODUCTIVE: Status post hysterectomy. No adnexal mass. MUSCULOSKELETAL. No bony spinal canal stenosis or focal osseous abnormality. OTHER: None. IMPRESSION: No acute abnormality of the abdomen or pelvis. Electronically Signed   By: Deatra Robinson M.D.   On: 05/02/2018 02:28   Dg Foot Complete Left  Result Date: 05/02/2018 CLINICAL DATA:  Left foot run over by lawnmower, abrasions EXAM: LEFT FOOT - COMPLETE 3+ VIEW COMPARISON:  04/29/2018 FINDINGS: No fracture or malalignment. No periostitis. No radiopaque foreign body. No soft tissue gas. IMPRESSION: No acute osseous abnormality. Electronically Signed   By: Jasmine Pang M.D.   On: 05/02/2018 02:13       Assessment & Plan:    Active Problems:   Cellulitis of left foot   1. Cellulitis of left foot-continue vancomycin and Zosyn.  Follow blood culture results.  We will also obtain MRI of left foot to look for underlying osteomyelitis. 2. Abdominal pain-unclear etiology, CT abdomen pelvis is negative.  Will monitor patient closely in the hospital. 3. Hypokalemia-potassium 3.1, replace potassium and check BMP in a.m. 4. Hypertension-continue lisinopril, Norvasc 5. Asthma-stable, continue PRN albuterol.    DVT Prophylaxis-   Lovenox   AM Labs Ordered, also please review Full Orders  Family Communication: Admission, patients condition and plan of care including tests being ordered have been discussed with the  patient and her daughter at bedside who indicate understanding and agree with the plan and Code Status.  Code Status: Full code  Admission status: Inpatient  Time spent in minutes : 60 minutes   Meredeth Ide M.D on 05/02/2018 at 5:11 AM  Between 7am to 7pm - Pager - [phone]. After 7pm go to [email or web address] - password [email or web address]  [facility] -  Office  [phone]

## 2018-05-02 NOTE — Progress Notes (Addendum)
Pharmacy Antibiotic Note  Katrina Good is a 47 y.o. female admitted on 05/01/2018 with sepsis.  Pharmacy has been consulted for Vancomycin and zosyn dosing.  Plan: Vancomycin 1000mg  IV every 8 hours.  Goal trough 15-20 mcg/mL. Zosyn 3.375g IV q8h (4 hour infusion).  F/U cxs and clinical progress Monitor V/S, labs, and levels as indicated  Height: 5' 9.5" (176.5 cm) Weight: 220 lb (99.8 kg) IBW/kg (Calculated) : 67.35  Temp (24hrs), Avg:99.1 F (37.3 C), Min:98.2 F (36.8 C), Max:100.1 F (37.8 C)  Recent Labs  Lab 04/29/18 1032 05/02/18 0001 05/02/18 0024 05/02/18 0645 05/02/18 0721  WBC 9.4 12.5*  --   --  9.3  CREATININE 0.83 0.97  --   --  0.76  LATICACIDVEN  --  1.9 2.17* 2.62*  --     Estimated Creatinine Clearance: 110.3 mL/min (by C-G formula based on SCr of 0.76 mg/dL).    Allergies  Allergen Reactions  . Doxycycline Hives    Antimicrobials this admission: Vancomycin 7/2 >>  Zosyn 7/2 >>   Dose adjustments this admission: n/a  Microbiology results: 7/2  BCx: pending UCx:    MRSA PCR:   Thank you for allowing pharmacy to be a part of this patient's care.  Elder CyphersLorie Doniel Maiello, BS Pharm D, BCPS Clinical Pharmacist Pager [phone] 05/02/2018 9:12 AM

## 2018-05-03 DIAGNOSIS — S91332A Puncture wound without foreign body, left foot, initial encounter: Secondary | ICD-10-CM

## 2018-05-03 DIAGNOSIS — S92002D Unspecified fracture of left calcaneus, subsequent encounter for fracture with routine healing: Secondary | ICD-10-CM

## 2018-05-03 DIAGNOSIS — S91332D Puncture wound without foreign body, left foot, subsequent encounter: Secondary | ICD-10-CM

## 2018-05-03 DIAGNOSIS — S92002A Unspecified fracture of left calcaneus, initial encounter for closed fracture: Secondary | ICD-10-CM

## 2018-05-03 DIAGNOSIS — F1410 Cocaine abuse, uncomplicated: Secondary | ICD-10-CM

## 2018-05-03 LAB — CBC
HEMATOCRIT: 37 % (ref 36.0–46.0)
Hemoglobin: 11.7 g/dL — ABNORMAL LOW (ref 12.0–15.0)
MCH: 26.5 pg (ref 26.0–34.0)
MCHC: 31.6 g/dL (ref 30.0–36.0)
MCV: 83.9 fL (ref 78.0–100.0)
Platelets: 206 10*3/uL (ref 150–400)
RBC: 4.41 MIL/uL (ref 3.87–5.11)
RDW: 15.9 % — ABNORMAL HIGH (ref 11.5–15.5)
WBC: 6.9 10*3/uL (ref 4.0–10.5)

## 2018-05-03 LAB — BASIC METABOLIC PANEL
Anion gap: 4 — ABNORMAL LOW (ref 5–15)
BUN: 9 mg/dL (ref 6–20)
CO2: 26 mmol/L (ref 22–32)
Calcium: 8.3 mg/dL — ABNORMAL LOW (ref 8.9–10.3)
Chloride: 110 mmol/L (ref 98–111)
Creatinine, Ser: 0.73 mg/dL (ref 0.44–1.00)
GFR calc Af Amer: >60 mL/min (ref >60)
GFR calc non Af Amer: >60 mL/min (ref >60)
GLUCOSE: 137 mg/dL — AB (ref 70–99)
POTASSIUM: 4 mmol/L (ref 3.5–5.1)
Sodium: 140 mmol/L (ref 135–145)

## 2018-05-03 LAB — HIV ANTIBODY (ROUTINE TESTING W REFLEX): HIV SCREEN 4TH GENERATION: NONREACTIVE

## 2018-05-03 LAB — LACTIC ACID, PLASMA: Lactic Acid, Venous: 0.9 mmol/L (ref 0.5–1.9)

## 2018-05-03 MED ORDER — TETANUS IMMUNE GLOBULIN 250 UNIT/ML IM INJ
250.0000 [IU] | INJECTION | Freq: Once | INTRAMUSCULAR | Status: AC
  Administered 2018-05-03: 250 [IU] via INTRAMUSCULAR
  Filled 2018-05-03: qty 1

## 2018-05-03 MED ORDER — AMOXICILLIN-POT CLAVULANATE 875-125 MG PO TABS: 1.0000 | ORAL_TABLET | Freq: Two times a day (BID) | ORAL | 0 refills | Status: DC

## 2018-05-03 MED ORDER — HYDROCODONE-ACETAMINOPHEN 5-325 MG PO TABS: 1.0000 | ORAL_TABLET | ORAL | 0 refills | Status: DC | PRN

## 2018-05-03 NOTE — Discharge Summary (Signed)
Physician Discharge Summary  Katrina Good ZOX:[id number] DOB: February 22, 1971 DOA: 05/01/2018  PCP: Kizzie Furnish D., PA-C  Admit date: 05/01/2018 Discharge date: 05/03/2018  Admitted From: Home Disposition:  Home   Recommendations for Outpatient Follow-up:  1. Follow up with PCP in 1-2 weeks 2. Please obtain BMP/CBC in one week 3. Please follow up orthopedics in one week   Discharge Condition: Stable CODE STATUS: FULL Diet recommendation: Heart Healthy / Carb Modified    Brief/Interim Summary: 47 year old female with a history of diabetes mellitus, hypertension, asthma presenting with worsening left foot pain following a foreign body injury that occurred on 04/29/2018.  The patient states that a piece of fence wire pierced through her left foot on 04/29/2018.  She states that this was ejected from a lawnmower.  Patient was wearing flip-flops at the time.  The patient presented to emergency department on the day, and the foreign body was removed.  The patient was discharged home with ciprofloxacin and hydrocodone.  She stated that over the next 24 to 48 hours she had increasing pain and edema about her left foot and ankle with difficulty bearing weight.  As result, the patient presented to the hospital for further evaluation.  The patient stated that in addition to taking ibuprofen and Norco, she smoked some crack cocaine to help relieve the pain.  Patient also complained of abdominal pain for the past week without any hematochezia or melena.  CT of the abdomen and pelvis was performed the emergency department and was negative for any acute findings.  Repeat x-ray of the left foot was negative for any fractures.  Because of concerns for infectious process, the patient was admitted and started on intravenous antibiotics. Podiatry was consulted and irrigated the wound at bedside.  I spoke with Dr. Nolen Mu whom felt pt was stable for d/c from a podiatric standpoint with orthopedic follow up.  D/C home  with amox/clav x 10 days and cipro x 10 days   Discharge Diagnoses:  Left calcaneal fracture -Caused by ejected foreign body -Patient is fairly certain she is had 3 doses of tetanus in her life, last dose 2 years ago -podiatry consult appreciated--discussed with Dr. Lorenda Cahill wound at bedside and splint placed -pt to be NWB on left foot -follow up with ortho in one week as outpt -05/02/18 MRI left foot open fracture with some surrounding, not unexpected, marrow edema. The penetrating wound extends out through the skin but no surrounding fluid or edema. No evidence of an abscess or cellulitis. -received vanco and zosyn during the hospitalization -continue empiric abx--cipro and amox/clav x 7 more days-pt was given Rx for cipro on 6/29 for 7 days which she has not started--I told her to start taking it in addition to the amox/clav  Essential HTN -continue amlodipine and lisinopril  Diabetes mellitus type 2 -not on any agents outpt  Cocaine abuse -cessation discussed -HIV neg  Hypokalemia -repleted  Abdominal pain -CT abd negative -pt tolerating diet -likely due to constipation as pt has not had BM in 2 weeks -start miralax and senna      Discharge Instructions   Allergies as of 05/03/2018      Reactions   Doxycycline Hives      Medication List    TAKE these medications   albuterol (2.5 MG/3ML) 0.083% nebulizer solution Commonly known as:  PROVENTIL Take 2.5 mg by nebulization every 6 (six) hours as needed for wheezing or shortness of breath.   albuterol 108 (90 Base) MCG/ACT inhaler Commonly  known as:  PROVENTIL HFA;VENTOLIN HFA Inhale 2 puffs into the lungs every 6 (six) hours as needed for wheezing.   amLODipine 5 MG tablet Commonly known as:  NORVASC Take 1 tablet (5 mg total) by mouth daily.   amoxicillin-clavulanate 875-125 MG tablet Commonly known as:  AUGMENTIN Take 1 tablet by mouth 2 (two) times daily.   aspirin EC 81 MG tablet Take  81 mg by mouth daily.   ciprofloxacin 500 MG tablet Commonly known as:  CIPRO Take 1 tablet (500 mg total) by mouth 2 (two) times daily. One po bid x 7 days   diclofenac 75 MG EC tablet Commonly known as:  VOLTAREN Take 1 tablet (75 mg total) by mouth 2 (two) times daily. Take with food   HYDROcodone-acetaminophen 5-325 MG tablet Commonly known as:  NORCO Take 1-2 tablets by mouth every 4 (four) hours as needed.   lisinopril 5 MG tablet Commonly known as:  PRINIVIL,ZESTRIL Take 1 tablet (5 mg total) by mouth daily.   ondansetron 4 MG disintegrating tablet Commonly known as:  ZOFRAN ODT 4mg  ODT q4 hours prn nausea/vomit      Follow-up Information    Vickki HearingHarrison, Stanley E, MD Follow up in 1 week(s).   Specialties:  Orthopedic Surgery, Radiology Contact information: [address] [place] [id number] [phone]          Allergies  Allergen Reactions  . Doxycycline Hives    Consultations:  Ortho  Podiatry--[place]   Procedures/Studies: Dg Chest 2 View  Result Date: 05/02/2018 CLINICAL DATA:  Code sepsis EXAM: CHEST - 2 VIEW COMPARISON:  04/12/2016 FINDINGS: Increased interstitial opacity. No focal consolidation or effusion. Normal heart size. No pneumothorax. IMPRESSION: Increased interstitial opacity may be due to reactive airways or bronchial inflammation. No focal pneumonia is seen. Electronically Signed   By: Jasmine PangKim  Fujinaga M.D.   On: 05/02/2018 02:11   Ct Abdomen Pelvis W Contrast  Result Date: 05/02/2018 CLINICAL DATA:  Altered mental status and fever EXAM: CT ABDOMEN AND PELVIS WITH CONTRAST TECHNIQUE: Multidetector CT imaging of the abdomen and pelvis was performed using the standard protocol following bolus administration of intravenous contrast. CONTRAST:  100mL ISOVUE-300 IOPAMIDOL (ISOVUE-300) INJECTION 61% COMPARISON:  CT abdomen pelvis 11/22/2015 FINDINGS: LOWER CHEST: No basilar pulmonary nodules or pleural effusion. No apical pericardial  effusion. HEPATOBILIARY: Normal hepatic contours and density. No intra- or extrahepatic biliary dilatation. Normal gallbladder. PANCREAS: Normal parenchymal contours without ductal dilatation. No peripancreatic fluid collection. SPLEEN: Normal. ADRENALS/URINARY TRACT: --Adrenal glands: Normal. --Right kidney/ureter: No hydronephrosis, nephroureterolithiasis, perinephric stranding or solid renal mass. --Left kidney/ureter: No hydronephrosis, nephroureterolithiasis, perinephric stranding or solid renal mass. --Urinary bladder: Normal for degree of distention STOMACH/BOWEL: --Stomach/Duodenum: No hiatal hernia or other gastric abnormality. Normal duodenal course. --Small bowel: No dilatation or inflammation. --Colon: No focal abnormality. --Appendix: Normal. VASCULAR/LYMPHATIC: Normal course and caliber of the major abdominal vessels. No abdominal or pelvic lymphadenopathy. REPRODUCTIVE: Status post hysterectomy. No adnexal mass. MUSCULOSKELETAL. No bony spinal canal stenosis or focal osseous abnormality. OTHER: None. IMPRESSION: No acute abnormality of the abdomen or pelvis. Electronically Signed   By: Deatra RobinsonKevin  Herman M.D.   On: 05/02/2018 02:28   Mr Foot Left Wo Contrast  Result Date: 05/02/2018 CLINICAL DATA:  Nonhealing wound involving the lateral aspect of the heel. Prior penetrating trauma. EXAM: MRI OF THE LEFT FOOT WITHOUT CONTRAST TECHNIQUE: Multiplanar, multisequence MR imaging of the left foot was performed. No intravenous contrast was administered. COMPARISON:  Radiographs 04/29/2018 and 05/02/2018 FINDINGS: Evidence of the  recent penetrating trauma with a metallic object into the posterior and inferior aspect of the calcaneus laterally. There is an open fracture with some surrounding, not unexpected, marrow edema. The penetrating wound extends out through the skin but no surrounding fluid or edema. No evidence of an abscess or cellulitis. Mild soft tissue swelling/edema noted around the ankle. The ankle  ligaments and tendons are intact. The Achilles tendon and plantar fascia are intact. Small tibiotalar joint effusion noted. The subtalar joints are maintained. Small amount of fluid in the posterior talocalcaneal facet. The foot and ankle musculature appear unremarkable. IMPRESSION: Expected MR findings from the recent penetrating injury with an open fracture involving the calcaneus. No findings to suggest drainable soft tissue abscess or cellulitis. Electronically Signed   By: Rudie Meyer M.D.   On: 05/02/2018 10:53   Dg Foot Complete Left  Result Date: 05/02/2018 CLINICAL DATA:  Left foot run over by lawnmower, abrasions EXAM: LEFT FOOT - COMPLETE 3+ VIEW COMPARISON:  04/29/2018 FINDINGS: No fracture or malalignment. No periostitis. No radiopaque foreign body. No soft tissue gas. IMPRESSION: No acute osseous abnormality. Electronically Signed   By: Jasmine Pang M.D.   On: 05/02/2018 02:13   Dg Foot Complete Left  Result Date: 04/29/2018 CLINICAL DATA:  Foreign body in heel. EXAM: LEFT FOOT - COMPLETE 3+ VIEW COMPARISON:  None. FINDINGS: There is a 6 cm long thick gauge wirelike metallic foreign body penetrating the hindfoot from lateral. There is likely involvement of the calcaneus, but calcaneus view would be optimal for determining bony penetration. No associated fracture. IMPRESSION: 6 cm long metallic foreign body penetrating the lateral hindfoot. There is likely calcaneus penetration, os calcis would be helpful. Electronically Signed   By: Marnee Spring M.D.   On: 04/29/2018 10:49        Discharge Exam: Vitals:   05/02/18 2056 05/03/18 0454  BP: (!) 91/55 101/62  Pulse: 66 64  Resp:  19  Temp: 98.6 F (37 C) 98.4 F (36.9 C)  SpO2: 98% 97%   Vitals:   05/02/18 1250 05/02/18 1324 05/02/18 2056 05/03/18 0454  BP: 132/64  (!) 91/55 101/62  Pulse: 82  66 64  Resp: 20   19  Temp: 98.5 F (36.9 C)  98.6 F (37 C) 98.4 F (36.9 C)  TempSrc: Oral  Oral Oral  SpO2: 99% 94% 98%  97%  Weight:      Height:        General: Pt is alert, awake, not in acute distress Cardiovascular: RRR, S1/S2 +, no rubs, no gallops Respiratory: CTA bilaterally, no wheezing, no rhonchi Abdominal: Soft, NT, ND, bowel sounds + Extremities: no edema, no cyanosis;  +edema of left ankle without erythema or drainage     The results of significant diagnostics from this hospitalization (including imaging, microbiology, ancillary and laboratory) are listed below for reference.    Significant Diagnostic Studies: Dg Chest 2 View  Result Date: 05/02/2018 CLINICAL DATA:  Code sepsis EXAM: CHEST - 2 VIEW COMPARISON:  04/12/2016 FINDINGS: Increased interstitial opacity. No focal consolidation or effusion. Normal heart size. No pneumothorax. IMPRESSION: Increased interstitial opacity may be due to reactive airways or bronchial inflammation. No focal pneumonia is seen. Electronically Signed   By: Jasmine Pang M.D.   On: 05/02/2018 02:11   Ct Abdomen Pelvis W Contrast  Result Date: 05/02/2018 CLINICAL DATA:  Altered mental status and fever EXAM: CT ABDOMEN AND PELVIS WITH CONTRAST TECHNIQUE: Multidetector CT imaging of the abdomen and pelvis was performed  using the standard protocol following bolus administration of intravenous contrast. CONTRAST:  ISOVUE-300 IOPAMIDOL (ISOVUE-300) INJECTION 61% COMPARISON:  CT abdomen pelvis 11/22/2015 FINDINGS: LOWER CHEST: No basilar pulmonary nodules or pleural effusion. No apical pericardial effusion. HEPATOBILIARY: Normal hepatic contours and density. No intra- or extrahepatic biliary dilatation. Normal gallbladder. PANCREAS: Normal parenchymal contours without ductal dilatation. No peripancreatic fluid collection. SPLEEN: Normal. ADRENALS/URINARY TRACT: --Adrenal glands: Normal. --Right kidney/ureter: No hydronephrosis, nephroureterolithiasis, perinephric stranding or solid renal mass. --Left kidney/ureter: No hydronephrosis, nephroureterolithiasis, perinephric  stranding or solid renal mass. --Urinary bladder: Normal for degree of distention STOMACH/BOWEL: --Stomach/Duodenum: No hiatal hernia or other gastric abnormality. Normal duodenal course. --Small bowel: No dilatation or inflammation. --Colon: No focal abnormality. --Appendix: Normal. VASCULAR/LYMPHATIC: Normal course and caliber of the major abdominal vessels. No abdominal or pelvic lymphadenopathy. REPRODUCTIVE: Status post hysterectomy. No adnexal mass. MUSCULOSKELETAL. No bony spinal canal stenosis or focal osseous abnormality. OTHER: None. IMPRESSION: No acute abnormality of the abdomen or pelvis. Electronically Signed   By: Deatra Robinson M.D.   On: 05/02/2018 02:28   Mr Foot Left Wo Contrast  Result Date: 05/02/2018 CLINICAL DATA:  Nonhealing wound involving the lateral aspect of the heel. Prior penetrating trauma. EXAM: MRI OF THE LEFT FOOT WITHOUT CONTRAST TECHNIQUE: Multiplanar, multisequence MR imaging of the left foot was performed. No intravenous contrast was administered. COMPARISON:  Radiographs 04/29/2018 and 05/02/2018 FINDINGS: Evidence of the recent penetrating trauma with a metallic object into the posterior and inferior aspect of the calcaneus laterally. There is an open fracture with some surrounding, not unexpected, marrow edema. The penetrating wound extends out through the skin but no surrounding fluid or edema. No evidence of an abscess or cellulitis. Mild soft tissue swelling/edema noted around the ankle. The ankle ligaments and tendons are intact. The Achilles tendon and plantar fascia are intact. Small tibiotalar joint effusion noted. The subtalar joints are maintained. Small amount of fluid in the posterior talocalcaneal facet. The foot and ankle musculature appear unremarkable. IMPRESSION: Expected MR findings from the recent penetrating injury with an open fracture involving the calcaneus. No findings to suggest drainable soft tissue abscess or cellulitis. Electronically Signed   By:  Rudie Meyer M.D.   On: 05/02/2018 10:53   Dg Foot Complete Left  Result Date: 05/02/2018 CLINICAL DATA:  Left foot run over by lawnmower, abrasions EXAM: LEFT FOOT - COMPLETE 3+ VIEW COMPARISON:  04/29/2018 FINDINGS: No fracture or malalignment. No periostitis. No radiopaque foreign body. No soft tissue gas. IMPRESSION: No acute osseous abnormality. Electronically Signed   By: Jasmine Pang M.D.   On: 05/02/2018 02:13   Dg Foot Complete Left  Result Date: 04/29/2018 CLINICAL DATA:  Foreign body in heel. EXAM: LEFT FOOT - COMPLETE 3+ VIEW COMPARISON:  None. FINDINGS: There is a 6 cm long thick gauge wirelike metallic foreign body penetrating the hindfoot from lateral. There is likely involvement of the calcaneus, but calcaneus view would be optimal for determining bony penetration. No associated fracture. IMPRESSION: 6 cm long metallic foreign body penetrating the lateral hindfoot. There is likely calcaneus penetration, os calcis would be helpful. Electronically Signed   By: Marnee Spring M.D.   On: 04/29/2018 10:49     Microbiology: Recent Results (from the past 240 hour(s))  Culture, blood (Routine x 2)     Status: None (Preliminary result)   Collection Time: 05/02/18 12:01 AM  Result Value Ref Range Status   Specimen Description BLOOD RIGHT HAND  Final   Special Requests   Final  BOTTLES DRAWN AEROBIC AND ANAEROBIC Blood Culture adequate volume   Culture   Final    NO GROWTH 1 DAY Performed at [facility], [address] [place], [place] [address]    Report Status PENDING  Incomplete  Culture, blood (Routine x 2)     Status: None (Preliminary result)   Collection Time: 05/02/18  2:36 AM  Result Value Ref Range Status   Specimen Description BLOOD LEFT HAND  Final   Special Requests   Final    BOTTLES DRAWN AEROBIC AND ANAEROBIC Blood Culture adequate volume   Culture   Final    NO GROWTH 1 DAY Performed at [facility], [address] [place], [place] [address]    Report  Status PENDING  Incomplete     Labs: Basic Metabolic Panel: Recent Labs  Lab 04/29/18 1032 05/02/18 0001 05/02/18 0721 05/03/18 0506  NA 139 138 140 140  K 3.7 3.1* 3.3* 4.0  CL 105 106 112* 110  CO2 27 23 22 26   GLUCOSE 83 109* 137* 137*  BUN 20 15 9 9   CREATININE 0.83 0.97 0.76 0.73  CALCIUM 8.9 9.0 7.5* 8.3*   Liver Function Tests: Recent Labs  Lab 05/02/18 0001 05/02/18 0721  AST 42* 21  ALT 44 31  ALKPHOS 92 76  BILITOT 1.2 0.8  PROT 8.1 6.4*  ALBUMIN 3.9 3.0*   No results for input(s): LIPASE, AMYLASE in the last 168 hours. Recent Labs  Lab 05/02/18 0001  AMMONIA 42*   CBC: Recent Labs  Lab 04/29/18 1032 05/02/18 0001 05/02/18 0721 05/03/18 0506  WBC 9.4 12.5* 9.3 6.9  NEUTROABS  --  8.1*  --   --   HGB 14.0 13.6 11.5* 11.7*  HCT 42.0 40.2 34.7* 37.0  MCV 81.9 81.9 83.0 83.9  PLT 224 214 197 206   Cardiac Enzymes: No results for input(s): CKTOTAL, CKMB, CKMBINDEX, TROPONINI in the last 168 hours. BNP: Invalid input(s): POCBNP CBG: No results for input(s): GLUCAP in the last 168 hours.  Time coordinating discharge:  36 minutes  Signed:  Catarina Hartshorn, DO [facility] Pager: [phone] 05/03/2018, 10:53 AM

## 2018-05-03 NOTE — Consult Note (Signed)
Podiatry Consult Note  Reason for Consultation:  Puncture wound left foot  History of Present Illness: Katrina Good is a 47 y.o. female who sustained a puncture wound of her left heel on 04/29/2018.  A friend was mowing the lawn and hit a piece of metal.  The metal flew into her foot.  She presented to the ED at [facility].  The foreign body was removed.  She was prescribed ciprofloxacin 500 mg BID and released with crutches to use for a "few days".  [facility] on 05/01/2018 via EMS for altered mental status and vomiting.  She did not take the ciprofloxacin 500 mg BID.   Past Medical History:  Diagnosis Date  . Acid reflux   . Asthma   . Bronchitis   . Diabetes mellitus   . Hypertension   . Substance abuse (HCC)    pt denies  . Suicidal thoughts    pt denies   Scheduled Meds: . amLODipine  5 mg Oral Daily  . aspirin EC  81 mg Oral Daily  . enoxaparin (LOVENOX) injection  40 mg Subcutaneous Q24H  . lisinopril  5 mg Oral Daily  . nicotine  14 mg Transdermal Daily   Continuous Infusions: . sodium chloride 75 mL/hr at 05/03/18 0213  . piperacillin-tazobactam (ZOSYN)  IV Stopped (05/03/18 0513)  . vancomycin Stopped (05/03/18 0213)   PRN Meds:.acetaminophen **OR** acetaminophen, albuterol, HYDROmorphone (DILAUDID) injection, ondansetron **OR** ondansetron (ZOFRAN) IV  Allergies  Allergen Reactions  . Doxycycline Hives   Past Surgical History:  Procedure Laterality Date  . ABDOMINAL HYSTERECTOMY    . CESAREAN SECTION     x 2   Family History  Problem Relation Age of Onset  . Diabetes Other   . Heart disease Unknown   . Arthritis Unknown   . Lung disease Unknown   . Cancer Unknown    Social History:  reports that she has been smoking cigarettes.  She has a 25.00 pack-year smoking history. She has never used smokeless tobacco. She reports that she does not drink alcohol or use drugs.  Review of Systems: Significant for left foot pain.   Difficulty bearing weight.  Physical Examination: Vital signs in last 24 hours:   Temp:  [98.4 F (36.9 C)-98.6 F (37 C)] 98.4 F (36.9 C) (07/03 0454) Pulse Rate:  [64-82] 64 (07/03 0454) Resp:  [19-20] 19 (07/03 0454) BP: (91-132)/(55-64) 101/62 (07/03 0454) SpO2:  [94 %-99 %] 97 % (07/03 0454)  DRESSING:  None. INTEGUMENT:  Skin of L foot is warm.  Puncture wound lateral aspect of L heel.  No active exudate. VASCULAR:  Pedal pulses palpable.  Edema of left foot and ankle. MUSCULOSKELETAL:  Calves soft and nontender.  Able to dorsiflex and plantarflex digits. NEUROLOGIC:  Light touch sensation grossly intact.  Protective sensation diminished.  Lab/Test Results:   Recent Labs    05/02/18 0721 05/03/18 0506  WBC 9.3 6.9  HGB 11.5* 11.7*  HCT 34.7* 37.0  PLT 197 206  NA 140 140  K 3.3* 4.0  CL 112* 110  CO2 22 26  BUN 9 9  CREATININE 0.76 0.73  GLUCOSE 137* 137*  CALCIUM 7.5* 8.3*    Recent Results (from the past 240 hour(s))  Culture, blood (Routine x 2)     Status: None (Preliminary result)   Collection Time: 05/02/18 12:01 AM  Result Value Ref Range Status   Specimen Description BLOOD RIGHT HAND  Final   Special  Requests   Final    BOTTLES DRAWN AEROBIC AND ANAEROBIC Blood Culture adequate volume   Culture   Final    NO GROWTH 1 DAY Performed at [facility], [address] [place], [place] [address]    Report Status PENDING  Incomplete  Culture, blood (Routine x 2)     Status: None (Preliminary result)   Collection Time: 05/02/18  2:36 AM  Result Value Ref Range Status   Specimen Description BLOOD LEFT HAND  Final   Special Requests   Final    BOTTLES DRAWN AEROBIC AND ANAEROBIC Blood Culture adequate volume   Culture   Final    NO GROWTH 1 DAY Performed at [facility] : [facility], [address], [place], [place] [address]    Report Status PENDING  Incomplete     Dg Chest 2 View  Result Date: 05/02/2018 CLINICAL DATA:  Code sepsis EXAM: CHEST - 2 VIEW  COMPARISON:  04/12/2016 FINDINGS: Increased interstitial opacity. No focal consolidation or effusion. Normal heart size. No pneumothorax. IMPRESSION: Increased interstitial opacity may be due to reactive airways or bronchial inflammation. No focal pneumonia is seen. Electronically Signed   By: Jasmine Pang M.D.   On: 05/02/2018 02:11   Ct Abdomen Pelvis W Contrast  Result Date: 05/02/2018 CLINICAL DATA:  Altered mental status and fever EXAM: CT ABDOMEN AND PELVIS WITH CONTRAST TECHNIQUE: Multidetector CT imaging of the abdomen and pelvis was performed using the standard protocol following bolus administration of intravenous contrast. CONTRAST:  ISOVUE-300 IOPAMIDOL (ISOVUE-300) INJECTION 61% COMPARISON:  CT abdomen pelvis 11/22/2015 FINDINGS: LOWER CHEST: No basilar pulmonary nodules or pleural effusion. No apical pericardial effusion. HEPATOBILIARY: Normal hepatic contours and density. No intra- or extrahepatic biliary dilatation. Normal gallbladder. PANCREAS: Normal parenchymal contours without ductal dilatation. No peripancreatic fluid collection. SPLEEN: Normal. ADRENALS/URINARY TRACT: --Adrenal glands: Normal. --Right kidney/ureter: No hydronephrosis, nephroureterolithiasis, perinephric stranding or solid renal mass. --Left kidney/ureter: No hydronephrosis, nephroureterolithiasis, perinephric stranding or solid renal mass. --Urinary bladder: Normal for degree of distention STOMACH/BOWEL: --Stomach/Duodenum: No hiatal hernia or other gastric abnormality. Normal duodenal course. --Small bowel: No dilatation or inflammation. --Colon: No focal abnormality. --Appendix: Normal. VASCULAR/LYMPHATIC: Normal course and caliber of the major abdominal vessels. No abdominal or pelvic lymphadenopathy. REPRODUCTIVE: Status post hysterectomy. No adnexal mass. MUSCULOSKELETAL. No bony spinal canal stenosis or focal osseous abnormality. OTHER: None. IMPRESSION: No acute abnormality of the abdomen or pelvis.  Electronically Signed   By: Deatra Robinson M.D.   On: 05/02/2018 02:28   Mr Foot Left Wo Contrast  Result Date: 05/02/2018 CLINICAL DATA:  Nonhealing wound involving the lateral aspect of the heel. Prior penetrating trauma. EXAM: MRI OF THE LEFT FOOT WITHOUT CONTRAST TECHNIQUE: Multiplanar, multisequence MR imaging of the left foot was performed. No intravenous contrast was administered. COMPARISON:  Radiographs 04/29/2018 and 05/02/2018 FINDINGS: Evidence of the recent penetrating trauma with a metallic object into the posterior and inferior aspect of the calcaneus laterally. There is an open fracture with some surrounding, not unexpected, marrow edema. The penetrating wound extends out through the skin but no surrounding fluid or edema. No evidence of an abscess or cellulitis. Mild soft tissue swelling/edema noted around the ankle. The ankle ligaments and tendons are intact. The Achilles tendon and plantar fascia are intact. Small tibiotalar joint effusion noted. The subtalar joints are maintained. Small amount of fluid in the posterior talocalcaneal facet. The foot and ankle musculature appear unremarkable. IMPRESSION: Expected MR findings from the recent penetrating injury with an open fracture involving the  calcaneus. No findings to suggest drainable soft tissue abscess or cellulitis. Electronically Signed   By: Rudie Meyer M.D.   On: 05/02/2018 10:53   Dg Foot Complete Left  Result Date: 05/02/2018 CLINICAL DATA:  Left foot run over by lawnmower, abrasions EXAM: LEFT FOOT - COMPLETE 3+ VIEW COMPARISON:  04/29/2018 FINDINGS: No fracture or malalignment. No periostitis. No radiopaque foreign body. No soft tissue gas. IMPRESSION: No acute osseous abnormality. Electronically Signed   By: Jasmine Pang M.D.   On: 05/02/2018 02:13    Assessment: 1.  Puncture wound, L heel with calcaneal fracture. 2.  Diabetes mellitus with peripheral neuropathy.  Plan: Wound irrigated with saline and Betadine.   Betadine dressing applied.  BK posterior splint applied.  Leave intact.  NWB LLE.  Ciprofloxacin 500 mg BID x 10 days.  Augmentin 500 mg BID x 10 days.  F/U with Dr. Romeo Apple or [facility] in one week.    Marius Ditch 05/03/2018, 8:52 AM

## 2018-05-03 NOTE — Progress Notes (Signed)
Patient ID: Katrina HuddleKimberly A Meiners, female   DOB: November 09, 1970, 47 y.o.   MRN: [id number]   Please note.  Patient [facility] [place] the night of the 29th in the ER please consult them for further management as I am not on call and would be out of town for the next 5 days   Patient presents with metal puncture wound to left foot.  X-ray obtained and reviewed, no fracture to the bones.  Pain meds given IV and locally with lidocaine with epinephrine.  Pain improved in the ER and discussed with orthopedics who agreed with removal in the ED, wash, antibiotics and follow-up as needed in the clinic.  Patient comfortable this plan.  Foreign body removed without difficulty, crutches given for a couple days.  Patient is fairly certain she is had 3 doses of tetanus in her life.  Discussed if she realizes this is not the case we would recommend immunoglobulin, family is in the room for this discussion she will return on Monday if that is the case. Blood work  Results and differential diagnosis were discussed with the patient/parent/guardian. Xrays were independently reviewed by myself.  Close follow up outpatient was discussed, comfortable with the plan.

## 2018-05-03 NOTE — Care Management Note (Signed)
Case Management Note  Patient Details  Name: Josetta HuddleKimberly A Broers MRN: [id number] Date of Birth: 1971/05/15  Subjective/Objective:      From home with cellulitis. Unsinsured, [facility] Pt got abx and pain meds filled after previous ED visit but did not take the abx.               Action/Plan: DC home today. Pt may DC on same abx as previously Rx. CM gave MATCH to assist with cost IF abx changed. No other CM needs noted.   Expected Discharge Date:  05/03/18               Expected Discharge Plan:  Home/Self Care  In-House Referral:  NA  Discharge planning Services  CM Consult, MATCH Program  Post Acute Care Choice:  NA Choice offered to:  NA  DME Arranged:    DME Agency:     HH Arranged:    HH Agency:     Status of Service:  Completed, signed off  If discussed at [employer] Length of Stay Meetings, dates discussed:    Additional Comments:  Malcolm MetroChildress, Colton Tassin Demske, RN 05/03/2018, 11:07 AM

## 2018-05-03 NOTE — Progress Notes (Addendum)
Discharge instructions gone over with patient and daughter, verbalized understanding. IV removed, patient tolerated procedure well. Printed prescription and MATCH paper given to patient.

## 2018-05-03 NOTE — Discharge Instructions (Signed)
1. Follow up with PCP in 1-2 weeks 2. Please obtain BMP/CBC in one week 3. Please follow up orthopedics in one week

## 2018-05-07 LAB — CULTURE, BLOOD (ROUTINE X 2)
CULTURE: NO GROWTH
Culture: NO GROWTH
SPECIAL REQUESTS: ADEQUATE
Special Requests: ADEQUATE

## 2018-05-12 ENCOUNTER — Ambulatory Visit: Payer: Self-pay | Admitting: Orthopedic Surgery

## 2018-05-15 ENCOUNTER — Telehealth: Payer: Self-pay | Admitting: Orthopedic Surgery

## 2018-05-15 NOTE — Telephone Encounter (Signed)
Patient missed her appointment on Friday, 05/12/18 because she overslept.  She called back this morning to schedule an appointment with Dr. Romeo AppleHarrison.  I sent Dr. Romeo AppleHarrison a message and he said he would see her this coming Thursday at 2:00.  I have called and spoken with Cala BradfordKimberly.  I have told her to be here this Thursday for a 2:00 appointment.  I asked her to be here a few minutes before 200.  She said she would do this.

## 2018-05-18 ENCOUNTER — Emergency Department (HOSPITAL_COMMUNITY)
Admission: EM | Admit: 2018-05-18 | Discharge: 2018-05-18 | Disposition: A | Discharge status: Home / Self Care | Payer: Self-pay | Attending: Emergency Medicine | Admitting: Emergency Medicine

## 2018-05-18 ENCOUNTER — Other Ambulatory Visit: Payer: Self-pay

## 2018-05-18 ENCOUNTER — Encounter (HOSPITAL_COMMUNITY): Payer: Self-pay | Admitting: Emergency Medicine

## 2018-05-18 ENCOUNTER — Encounter: Payer: Self-pay | Admitting: Orthopedic Surgery

## 2018-05-18 ENCOUNTER — Ambulatory Visit (INDEPENDENT_AMBULATORY_CARE_PROVIDER_SITE_OTHER): Payer: Self-pay | Admitting: Orthopedic Surgery

## 2018-05-18 ENCOUNTER — Emergency Department (HOSPITAL_COMMUNITY): Payer: Self-pay

## 2018-05-18 VITALS — BP 122/73 | HR 91

## 2018-05-18 DIAGNOSIS — F17210 Nicotine dependence, cigarettes, uncomplicated: Secondary | ICD-10-CM | POA: Insufficient documentation

## 2018-05-18 DIAGNOSIS — E119 Type 2 diabetes mellitus without complications: Secondary | ICD-10-CM | POA: Insufficient documentation

## 2018-05-18 DIAGNOSIS — M79672 Pain in left foot: Secondary | ICD-10-CM

## 2018-05-18 DIAGNOSIS — I10 Essential (primary) hypertension: Secondary | ICD-10-CM | POA: Insufficient documentation

## 2018-05-18 DIAGNOSIS — T148XXA Other injury of unspecified body region, initial encounter: Secondary | ICD-10-CM

## 2018-05-18 DIAGNOSIS — G8929 Other chronic pain: Secondary | ICD-10-CM

## 2018-05-18 DIAGNOSIS — L089 Local infection of the skin and subcutaneous tissue, unspecified: Secondary | ICD-10-CM | POA: Insufficient documentation

## 2018-05-18 DIAGNOSIS — J45909 Unspecified asthma, uncomplicated: Secondary | ICD-10-CM | POA: Insufficient documentation

## 2018-05-18 DIAGNOSIS — Z79899 Other long term (current) drug therapy: Secondary | ICD-10-CM | POA: Insufficient documentation

## 2018-05-18 DIAGNOSIS — Z7982 Long term (current) use of aspirin: Secondary | ICD-10-CM | POA: Insufficient documentation

## 2018-05-18 LAB — CBC WITH DIFFERENTIAL/PLATELET
BASOS ABS: 0 10*3/uL (ref 0.0–0.1)
BASOS PCT: 1 %
EOS ABS: 0.1 10*3/uL (ref 0.0–0.7)
EOS PCT: 2 %
HCT: 40.7 % (ref 36.0–46.0)
Hemoglobin: 13.1 g/dL (ref 12.0–15.0)
Lymphocytes Relative: 37 %
Lymphs Abs: 2.9 10*3/uL (ref 0.7–4.0)
MCH: 27.2 pg (ref 26.0–34.0)
MCHC: 32.2 g/dL (ref 30.0–36.0)
MCV: 84.4 fL (ref 78.0–100.0)
MONO ABS: 0.7 10*3/uL (ref 0.1–1.0)
Monocytes Relative: 10 %
Neutro Abs: 4 10*3/uL (ref 1.7–7.7)
Neutrophils Relative %: 50 %
PLATELETS: 240 10*3/uL (ref 150–400)
RBC: 4.82 MIL/uL (ref 3.87–5.11)
RDW: 16.3 % — AB (ref 11.5–15.5)
WBC: 7.7 10*3/uL (ref 4.0–10.5)

## 2018-05-18 LAB — BASIC METABOLIC PANEL
ANION GAP: 5 (ref 5–15)
BUN: 17 mg/dL (ref 6–20)
CALCIUM: 8.5 mg/dL — AB (ref 8.9–10.3)
CO2: 27 mmol/L (ref 22–32)
Chloride: 109 mmol/L (ref 98–111)
Creatinine, Ser: 1.12 mg/dL — ABNORMAL HIGH (ref 0.44–1.00)
GFR, EST NON AFRICAN AMERICAN: 58 mL/min — AB (ref >60)
Glucose, Bld: 111 mg/dL — ABNORMAL HIGH (ref 70–99)
Potassium: 3.8 mmol/L (ref 3.5–5.1)
SODIUM: 141 mmol/L (ref 135–145)

## 2018-05-18 LAB — C-REACTIVE PROTEIN: CRP: 0.8 mg/dL (ref <1.0)

## 2018-05-18 LAB — SEDIMENTATION RATE: SED RATE: 7 mm/h (ref 0–22)

## 2018-05-18 MED ORDER — HYDROCODONE-ACETAMINOPHEN 5-325 MG PO TABS: 1.0000 | ORAL_TABLET | ORAL | 0 refills | Status: DC | PRN

## 2018-05-18 MED ORDER — CIPROFLOXACIN HCL 250 MG PO TABS
500.0000 mg | ORAL_TABLET | Freq: Once | ORAL | Status: AC
  Administered 2018-05-18: 500 mg via ORAL
  Filled 2018-05-18: qty 2

## 2018-05-18 MED ORDER — CIPROFLOXACIN HCL 500 MG PO TABS: 500.0000 mg | ORAL_TABLET | Freq: Two times a day (BID) | ORAL | 0 refills | Status: DC

## 2018-05-18 MED ORDER — AMOXICILLIN-POT CLAVULANATE 875-125 MG PO TABS: 1.0000 | ORAL_TABLET | Freq: Two times a day (BID) | ORAL | 0 refills | Status: DC

## 2018-05-18 MED ORDER — AMOXICILLIN-POT CLAVULANATE 875-125 MG PO TABS
1.0000 | ORAL_TABLET | Freq: Once | ORAL | Status: AC
  Administered 2018-05-18: 1 via ORAL
  Filled 2018-05-18: qty 1

## 2018-05-18 MED ORDER — HYDROCODONE-ACETAMINOPHEN 5-325 MG PO TABS
1.0000 | ORAL_TABLET | Freq: Once | ORAL | Status: AC
  Administered 2018-05-18: 1 via ORAL
  Filled 2018-05-18: qty 1

## 2018-05-18 NOTE — Progress Notes (Signed)
NEW PATIENT OFFICE VISI  Chief Complaint  Patient presents with  . Foot Injury    04/29/18 ER visit x40     47 year old female had a wire go into her left heel on June 29 she presented to the ER on 2 occasions was admitted to the hospital consulted by podiatry and for some reason sent here for follow-up.  She went to the health department a week and a half or 2 ago and they took her off of her antibiotics and did not put her on any antibiotics.  She presents here as instructed by her discharge plan but I never saw the patient.  I am sending her back to the emergency room to be worked up again for possible osteomyelitis.  Dr. Nolen Mu who saw the patient can be called to get instructions  She reports intense pain in her heel on the left swelling no improvement inability to weight-bear   Review of Systems  Constitutional: Negative for chills and fever.  Musculoskeletal: Positive for joint pain.  Neurological: Negative for tingling.     Past Medical History:  Diagnosis Date  . Acid reflux   . Asthma   . Bronchitis   . Diabetes mellitus   . Hypertension   . Substance abuse (HCC)    pt denies  . Suicidal thoughts    pt denies    Past Surgical History:  Procedure Laterality Date  . ABDOMINAL HYSTERECTOMY    . CESAREAN SECTION     x 2    Family History  Problem Relation Age of Onset  . Diabetes Other   . Heart disease Unknown   . Arthritis Unknown   . Lung disease Unknown   . Cancer Unknown    Social History   Tobacco Use  . Smoking status: Current Every Day Smoker    Packs/day: 1.00    Years: 25.00    Pack years: 25.00    Types: Cigarettes  . Smokeless tobacco: Never Used  . Tobacco comment: 2-3 packs  Substance Use Topics  . Alcohol use: No    Frequency: Never  . Drug use: No    Types: Marijuana    Comment: pt denies    Allergies  Allergen Reactions  . Doxycycline Hives    Current Meds  Medication Sig  . amLODipine (NORVASC) 5 MG tablet Take 1  tablet (5 mg total) by mouth daily.  Marland Kitchen aspirin EC 81 MG tablet Take 81 mg by mouth daily.  . famotidine (PEPCID) 10 MG tablet Take 10 mg by mouth daily.  Marland Kitchen HYDROcodone-acetaminophen (NORCO) 5-325 MG tablet Take 1-2 tablets by mouth every 4 (four) hours as needed.  Marland Kitchen lisinopril (PRINIVIL,ZESTRIL) 5 MG tablet Take 1 tablet (5 mg total) by mouth daily.    BP 122/73   Pulse 91   Physical Exam  Constitutional: She is oriented to person, place, and time. She appears well-developed and well-nourished.  Neurological: She is alert and oriented to person, place, and time.  Psychiatric: She has a normal mood and affect. Judgment normal.  Vitals reviewed.   Ortho Exam  Left foot alignment is normal.  Ankle motion is normal.  Active and passive ankle is stable.  Skin does not show erythema.  No lymphadenopathy.  Patient unable to walk  SHE has a puncture wound which is closed there is swelling and tenderness around the posterior aspect of the heel there is no drainage but there is swelling neurovascular exam is intact  MEDICAL DECISION SECTION  Data:  [facility]   PLAIN FILMS:  CLINICAL DATA:  Left foot run over by lawnmower, abrasions   EXAM: LEFT FOOT - COMPLETE 3+ VIEW   COMPARISON:  04/29/2018   FINDINGS: No fracture or malalignment. No periostitis. No radiopaque foreign body. No soft tissue gas.   IMPRESSION: No acute osseous abnormality.     Electronically Signed   By: Jasmine PangKim  Fujinaga M.D.   On: 05/02/2018 02:13  MRI/ IMPRESSION: Expected MR findings from the recent penetrating injury with an open fracture involving the calcaneus. No findings to suggest drainable soft tissue abscess or cellulitis.     Electronically Signed   By: Rudie MeyerP.  Gallerani M.D.   On: 05/02/2018 10:53  My independent reading of xrays:  Puncture area through the left heel into the bone on MRI x-ray was negative  No diagnosis found.  PLAN: (Rx., injectx, surgery, frx, mri/ct) I Am  sending the patient back to the emergency room.  Dr.McKinney irrigated the wound clean that he should be consulted for further management.  I am also not sure why the health department took the patient off all antibiotics and left her off of the antibiotics for over a week.  Again I am not sure why she came here for follow-up  No orders of the defined types were placed in this encounter.   Fuller CanadaStanley Harrison, MD  05/18/2018 2:22 PM

## 2018-05-18 NOTE — ED Provider Notes (Signed)
[facility] EMERGENCY DEPARTMENT Provider Note   CSN: [id number] Arrival date & time: 05/18/18  1549     History   Chief Complaint Chief Complaint  Patient presents with  . Wound Infection    HPI Katrina Good is a 47 y.o. female.  HPI Patient sent in from Dr. Romeo Apple for infection of her left heel.  Recent admission to the hospital after around 3 weeks ago had a piece of wire get thrown into her left heel from a lawnmower.  End up being admitted to the hospital for infection of the heel.  Had been on Cipro and then Cipro and Augmentin.  Has been off any antibiotics for the last week by her primary care doctor.  States she is having fevers and chills again.  More painful in the left heel.  Dr. Roger Kill from podiatry saw the patient in the hospital on July 3 and did a washout, however he put in for the patient to follow-up with orthopedic surgery and not him.  Patient now just followed up with Ortho.  In the office Dr. Romeo Apple wondered why the patient did not follow-up with the podiatrist.  Has had no drainage was states there is more swelling now in the heel.  Has been more painful. Past Medical History:  Diagnosis Date  . Acid reflux   . Asthma   . Bronchitis   . Diabetes mellitus   . Hypertension   . Substance abuse (HCC)    pt denies  . Suicidal thoughts    pt denies    Patient Active Problem List   Diagnosis Date Noted  . Puncture wound of left foot with complication 05/03/2018  . Calcaneus fracture, left 05/03/2018  . Cocaine abuse (HCC) 05/03/2018  . Cellulitis of left foot 05/02/2018  . Hypokalemia   . Colles' fracture of right radius 03/13/2012    Past Surgical History:  Procedure Laterality Date  . ABDOMINAL HYSTERECTOMY    . CESAREAN SECTION     x 2     OB History    Gravida  5   Para  2   Term  2   Preterm      AB  3   Living  2     SAB  2   TAB  1   Ectopic      Multiple      Live Births               Home Medications     Prior to Admission medications   Medication Sig Start Date End Date Taking? Authorizing Provider  albuterol (PROVENTIL HFA;VENTOLIN HFA) 108 (90 BASE) MCG/ACT inhaler Inhale 2 puffs into the lungs every 6 (six) hours as needed for wheezing.    [provider]  albuterol (PROVENTIL) (2.5 MG/3ML) 0.083% nebulizer solution Take 2.5 mg by nebulization every 6 (six) hours as needed for wheezing or shortness of breath.    [provider]  amLODipine (NORVASC) 5 MG tablet Take 1 tablet (5 mg total) by mouth daily. 11/20/17   Fayrene Helper, PA-C  amoxicillin-clavulanate (AUGMENTIN) 875-125 MG tablet Take 1 tablet by mouth 2 (two) times daily. 05/18/18   Benjiman Core, MD  aspirin EC 81 MG tablet Take 81 mg by mouth daily.    [provider]  ciprofloxacin (CIPRO) 500 MG tablet Take 1 tablet (500 mg total) by mouth 2 (two) times daily. One po bid x 7 days 05/18/18   Benjiman Core, MD  diclofenac (VOLTAREN) 75  MG EC tablet Take 1 tablet (75 mg total) by mouth 2 (two) times daily. Take with food Patient not taking: Reported on 05/03/2018 06/20/17   Triplett, Tammy, PA-C  famotidine (PEPCID) 10 MG tablet Take 10 mg by mouth daily.    [provider]  HYDROcodone-acetaminophen (NORCO) 5-325 MG tablet Take 1-2 tablets by mouth every 4 (four) hours as needed. 05/18/18   Benjiman Core, MD  lisinopril (PRINIVIL,ZESTRIL) 5 MG tablet Take 1 tablet (5 mg total) by mouth daily. 11/20/17   Fayrene Helper, PA-C  ondansetron (ZOFRAN ODT) 4 MG disintegrating tablet 4mg  ODT q4 hours prn nausea/vomit Patient not taking: Reported on 05/18/2018 04/29/18   Blane Ohara, MD    Family History Family History  Problem Relation Age of Onset  . Diabetes Other   . Heart disease Unknown   . Arthritis Unknown   . Lung disease Unknown   . Cancer Unknown     Social History Social History   Tobacco Use  . Smoking status: Current Every Day Smoker    Packs/day: 1.00    Years: 25.00     Pack years: 25.00    Types: Cigarettes  . Smokeless tobacco: Never Used  . Tobacco comment: 2-3 packs  Substance Use Topics  . Alcohol use: No    Frequency: Never  . Drug use: Yes    Types: Marijuana, Cocaine    Comment: smoked crack yesterday      Allergies   Doxycycline   Review of Systems Review of Systems  Constitutional: Positive for chills and fever.  HENT: Negative for congestion.   Respiratory: Negative for shortness of breath.   Cardiovascular: Negative for chest pain.  Gastrointestinal: Negative for abdominal pain.  Genitourinary: Negative for flank pain.  Musculoskeletal:       Left heel pain.  Skin: Positive for wound.  Neurological: Positive for weakness.  Psychiatric/Behavioral: Negative for confusion.     Physical Exam Updated Vital Signs BP (!) 146/82 (BP Location: Right Arm)   Pulse 75   Temp 97.9 F (36.6 C) (Temporal)   Resp 18   Ht 5\' 9"  (1.753 m)   Wt 108.9 kg (240 lb)   SpO2 98%   BMI 35.44 kg/m   Physical Exam  Constitutional: She appears well-developed.  HENT:  Head: Normocephalic.  Neck: Neck supple.  Cardiovascular: Normal rate.  Pulmonary/Chest: Effort normal.  Abdominal: Soft.  Musculoskeletal: She exhibits tenderness.  Puncture wound to left lateral heel.  Has scab over it but there is some swelling and tenderness in this area.  No real erythema.  Skin: Skin is warm. Capillary refill takes less than 2 seconds.  Psychiatric: She has a normal mood and affect.     ED Treatments / Results  Labs (all labs ordered are listed, but only abnormal results are displayed) Labs Reviewed  CBC WITH DIFFERENTIAL/PLATELET - Abnormal; Notable for the following components:      Result Value   RDW 16.3 (*)    All other components within normal limits  BASIC METABOLIC PANEL - Abnormal; Notable for the following components:   Glucose, Bld 111 (*)    Creatinine, Ser 1.12 (*)    Calcium 8.5 (*)    GFR calc non Af Amer 58 (*)    All other  components within normal limits  SEDIMENTATION RATE  C-REACTIVE PROTEIN    EKG None  Radiology Dg Os Calcis Left  Result Date: 05/18/2018 CLINICAL DATA:  Injury several weeks ago. Continued calcaneal pain laterally. EXAM: LEFT  OS CALCIS - 2+ VIEW COMPARISON:  05/02/2018 FINDINGS: Small lucency noted in the posterior calcaneus in the area of prior penetrating foreign body on prior plain film and area of prior open fracture noted by MRI. No residual radiopaque foreign body or new bony abnormality. IMPRESSION: Lucency in the posterior calcaneus in the area of prior foreign body and open fracture seen on MRI. Electronically Signed   By: Charlett NoseKevin  Dover M.D.   On: 05/18/2018 19:55    Procedures Procedures (including critical care time)  Medications Ordered in ED Medications  amoxicillin-clavulanate (AUGMENTIN) 875-125 MG per tablet 1 tablet (1 tablet Oral Given 05/18/18 2109)  ciprofloxacin (CIPRO) tablet 500 mg (500 mg Oral Given 05/18/18 2109)     Initial Impression / Assessment and Plan / ED Course  I have reviewed the triage vital signs and the nursing notes.  Pertinent labs & imaging results that were available during my care of the patient were reviewed by me and considered in my medical decision making (see chart for details).     Patient with potential infection of left heel.  Recent open fracture with foreign body.  Had been off antibiotics.  Had seen Ortho but sad to follow-up with podiatry instead.  Discussed with Dr. Nolen MuMcKinney.  He will see in the office on the 23rd.  Will restart antibiotics.  Doubt severe osteomyelitis with normal sed rate however.  Final Clinical Impressions(s) / ED Diagnoses   Final diagnoses:  Wound infection    ED Discharge Orders        Ordered    amoxicillin-clavulanate (AUGMENTIN) 875-125 MG tablet  2 times daily     05/18/18 2146    ciprofloxacin (CIPRO) 500 MG tablet  2 times daily     05/18/18 2146    HYDROcodone-acetaminophen (NORCO) 5-325  MG tablet  Every 4 hours PRN     05/18/18 2146       Benjiman CorePickering, Alyaan Budzynski, MD 05/18/18 2148

## 2018-05-18 NOTE — ED Notes (Signed)
Patient has been given several snacks and diet soda.

## 2018-05-18 NOTE — Discharge Instructions (Signed)
It looks like you have a continued infection of your left heel.  Watch for fevers or worsening infection.  Follow-up in the office on the 23rd as planned.

## 2018-05-18 NOTE — ED Triage Notes (Signed)
Seen Dr Romeo AppleHarrison today and was told to come to ED for wound infection to LT heel

## 2018-05-18 NOTE — Consult Note (Signed)
Telephone Consult Note  Paged by Dr. Alvino Chapel.  Patient saw Dr. Aline Brochure earlier today and was instructed to go to ED.    Brief History: Patient initially presented to ED on 04/29/2018.  Per Dr. Nuala Alpha note, "discussed with orthopedics who agreed with removal in the ED...follow-up as needed in the clinic."  Foreign body was removed.  Received Tdap.  Placed on ciprofloxacin and provided crutches.    Patient returned to ED on 05/02/2018.  Admitted with WBC of 12.5.  Patient placed on vancomycin and Zosyn.  Per Dr. Ruthe Mannan noted dated 05/03/2018 he was contacted but was out of town.  I was then consulted by hospitalist.  Irrigated puncture wound at bedside.  Placed patient in BK posterior splint.  Instructed her to remain NWB.  WBC improved to 6.9.  Patient discharged on ciprofloxacin and Augmentin.  Instructed to follow-up with orthopedics in one week.   Patient saw Dr. Aline Brochure earlier today and was instructed to go to ED.  Apparently, patient was taken off of antibiotics by provider at [facility].  Reason unclear.   Vital signs in last 24 hours:   Temp:  [97.9 F (36.6 C)] 97.9 F (36.6 C) (07/18 1554) Pulse Rate:  [75-93] 75 (07/18 1950) Resp:  [16-18] 18 (07/18 1950) BP: (122-151)/(73-95) 146/82 (07/18 1950) SpO2:  [96 %-99 %] 98 % (07/18 1950) Weight:  [240 lb (108.9 kg)] 240 lb (108.9 kg) (07/18 1553)  Lab/Test Results:   Recent Labs    05/18/18 1747  WBC 7.7  HGB 13.1  HCT 40.7  PLT 240  NA 141  K 3.8  CL 109  CO2 27  BUN 17  CREATININE 1.12*  GLUCOSE 111*  CALCIUM 8.5*   ESR is 7.  No results found for this or any previous visit (from the past 240 hour(s)).   Dg Os Calcis Left  Result Date: 05/18/2018 CLINICAL DATA:  Injury several weeks ago. Continued calcaneal pain laterally. EXAM: LEFT OS CALCIS - 2+ VIEW COMPARISON:  05/02/2018 FINDINGS: Small lucency noted in the posterior calcaneus in the area of prior penetrating foreign body on prior plain film  and area of prior open fracture noted by MRI. No residual radiopaque foreign body or new bony abnormality. IMPRESSION: Lucency in the posterior calcaneus in the area of prior foreign body and open fracture seen on MRI. Electronically Signed   By: Rolm Baptise M.D.   On: 05/18/2018 19:55   Recommendations: Resume ciprofloxacin and Augmentin.  Remain NWB.  Follow-up with me at my [place] office on Tuesday at 05/23/2018 at 3:40 PM.  Brita Romp 05/18/2018, 8:46 PM

## 2018-05-18 NOTE — ED Notes (Signed)
Patient resting at this time. Patient comfortable. Hourly rounding done. Patient given diet coke to drink.

## 2018-05-28 ENCOUNTER — Emergency Department (HOSPITAL_COMMUNITY)
Admission: EM | Admit: 2018-05-28 | Discharge: 2018-05-28 | Disposition: A | Discharge status: Home / Self Care | Payer: Self-pay | Attending: Emergency Medicine | Admitting: Emergency Medicine

## 2018-05-28 ENCOUNTER — Other Ambulatory Visit: Payer: Self-pay

## 2018-05-28 ENCOUNTER — Encounter (HOSPITAL_COMMUNITY): Payer: Self-pay | Admitting: Emergency Medicine

## 2018-05-28 DIAGNOSIS — F1010 Alcohol abuse, uncomplicated: Secondary | ICD-10-CM | POA: Insufficient documentation

## 2018-05-28 DIAGNOSIS — G8929 Other chronic pain: Secondary | ICD-10-CM | POA: Insufficient documentation

## 2018-05-28 DIAGNOSIS — I10 Essential (primary) hypertension: Secondary | ICD-10-CM | POA: Insufficient documentation

## 2018-05-28 DIAGNOSIS — Z7982 Long term (current) use of aspirin: Secondary | ICD-10-CM | POA: Insufficient documentation

## 2018-05-28 DIAGNOSIS — J45909 Unspecified asthma, uncomplicated: Secondary | ICD-10-CM | POA: Insufficient documentation

## 2018-05-28 DIAGNOSIS — E119 Type 2 diabetes mellitus without complications: Secondary | ICD-10-CM | POA: Insufficient documentation

## 2018-05-28 DIAGNOSIS — F17210 Nicotine dependence, cigarettes, uncomplicated: Secondary | ICD-10-CM | POA: Insufficient documentation

## 2018-05-28 DIAGNOSIS — M79671 Pain in right foot: Secondary | ICD-10-CM | POA: Insufficient documentation

## 2018-05-28 DIAGNOSIS — F1410 Cocaine abuse, uncomplicated: Secondary | ICD-10-CM | POA: Insufficient documentation

## 2018-05-28 DIAGNOSIS — Z79899 Other long term (current) drug therapy: Secondary | ICD-10-CM | POA: Insufficient documentation

## 2018-05-28 HISTORY — DX: Other chronic pain: G89.29

## 2018-05-28 HISTORY — DX: Pain in left foot: M79.672

## 2018-05-28 LAB — BASIC METABOLIC PANEL
Anion gap: 6 (ref 5–15)
BUN: 19 mg/dL (ref 6–20)
CALCIUM: 9 mg/dL (ref 8.9–10.3)
CO2: 26 mmol/L (ref 22–32)
CREATININE: 0.88 mg/dL (ref 0.44–1.00)
Chloride: 108 mmol/L (ref 98–111)
GFR calc non Af Amer: >60 mL/min (ref >60)
GLUCOSE: 110 mg/dL — AB (ref 70–99)
Potassium: 3.7 mmol/L (ref 3.5–5.1)
Sodium: 140 mmol/L (ref 135–145)

## 2018-05-28 MED ORDER — KETOROLAC TROMETHAMINE 60 MG/2ML IM SOLN
60.0000 mg | Freq: Once | INTRAMUSCULAR | Status: AC
  Administered 2018-05-28: 60 mg via INTRAMUSCULAR
  Filled 2018-05-28: qty 2

## 2018-05-28 MED ORDER — MELOXICAM 7.5 MG PO TABS: 15.0000 mg | ORAL_TABLET | Freq: Every day | ORAL | 0 refills | Status: DC
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
## 2018-05-28 NOTE — Discharge Instructions (Signed)
Your foot is healing well, please avoid using crack cocaine or other drugs, please avoid smoking, this causes poor wound healing.  If you should develop redness swelling or fevers return to the emergency department immediately.  Mobic twice a day for pain, keep your foot elevated, do not walk on it, continue to use the wheelchair.

## 2018-05-28 NOTE — ED Triage Notes (Signed)
[facility] to [facility]. Pt with previous ankle injury approximately one month ago to same ankle. Pt denies any new injury to the ankle.

## 2018-05-30 ENCOUNTER — Other Ambulatory Visit (HOSPITAL_COMMUNITY)
Admission: RE | Admit: 2018-05-30 | Discharge: 2018-05-30 | Disposition: A | Discharge status: Home / Self Care | Payer: Self-pay | Source: Other Acute Inpatient Hospital | Attending: Podiatry | Admitting: Podiatry

## 2018-05-30 DIAGNOSIS — X58XXXD Exposure to other specified factors, subsequent encounter: Secondary | ICD-10-CM | POA: Insufficient documentation

## 2018-05-30 DIAGNOSIS — S91332D Puncture wound without foreign body, left foot, subsequent encounter: Secondary | ICD-10-CM | POA: Insufficient documentation

## 2018-05-30 DIAGNOSIS — E1142 Type 2 diabetes mellitus with diabetic polyneuropathy: Secondary | ICD-10-CM | POA: Insufficient documentation

## 2018-05-30 LAB — CBC WITH DIFFERENTIAL/PLATELET
BASOS ABS: 0 10*3/uL (ref 0.0–0.1)
BASOS PCT: 1 %
EOS ABS: 0.1 10*3/uL (ref 0.0–0.7)
EOS PCT: 1 %
HCT: 41 % (ref 36.0–46.0)
HEMOGLOBIN: 13.3 g/dL (ref 12.0–15.0)
Lymphocytes Relative: 38 %
Lymphs Abs: 3.2 10*3/uL (ref 0.7–4.0)
MCH: 27.2 pg (ref 26.0–34.0)
MCHC: 32.4 g/dL (ref 30.0–36.0)
MCV: 83.8 fL (ref 78.0–100.0)
Monocytes Absolute: 1 10*3/uL (ref 0.1–1.0)
Monocytes Relative: 12 %
NEUTROS PCT: 48 %
Neutro Abs: 4 10*3/uL (ref 1.7–7.7)
PLATELETS: 178 10*3/uL (ref 150–400)
RBC: 4.89 MIL/uL (ref 3.87–5.11)
RDW: 15.9 % — ABNORMAL HIGH (ref 11.5–15.5)
WBC: 8.3 10*3/uL (ref 4.0–10.5)

## 2018-05-30 LAB — COMPREHENSIVE METABOLIC PANEL WITH GFR
ALT: 14 U/L (ref 0–44)
AST: 13 U/L — ABNORMAL LOW (ref 15–41)
Albumin: 3.8 g/dL (ref 3.5–5.0)
Alkaline Phosphatase: 89 U/L (ref 38–126)
Anion gap: 6 (ref 5–15)
BUN: 14 mg/dL (ref 6–20)
CO2: 24 mmol/L (ref 22–32)
Calcium: 9 mg/dL (ref 8.9–10.3)
Chloride: 110 mmol/L (ref 98–111)
Creatinine, Ser: 0.93 mg/dL (ref 0.44–1.00)
GFR calc Af Amer: >60 mL/min
GFR calc non Af Amer: >60 mL/min
Glucose, Bld: 107 mg/dL — ABNORMAL HIGH (ref 70–99)
Potassium: 3.6 mmol/L (ref 3.5–5.1)
Sodium: 140 mmol/L (ref 135–145)
Total Bilirubin: 0.6 mg/dL (ref 0.3–1.2)
Total Protein: 7.5 g/dL (ref 6.5–8.1)

## 2018-05-30 LAB — SEDIMENTATION RATE: Sed Rate: 8 mm/h (ref 0–22)

## 2018-06-23 ENCOUNTER — Telehealth (HOSPITAL_COMMUNITY): Payer: Self-pay | Admitting: Physical Therapy

## 2018-06-23 NOTE — Telephone Encounter (Signed)
Pt called wanting to know if Dr. Nolen MuMcKinney sent in a Referral - and was given Financial Assistance number. Pt will call Financial Assis and MD on Monday. We do not have a referral for her at this time. NF 06/23/18

## 2018-06-25 ENCOUNTER — Other Ambulatory Visit: Payer: Self-pay

## 2018-06-25 ENCOUNTER — Encounter (HOSPITAL_COMMUNITY): Payer: Self-pay | Admitting: Emergency Medicine

## 2018-06-25 ENCOUNTER — Emergency Department (HOSPITAL_COMMUNITY)
Admission: EM | Admit: 2018-06-25 | Discharge: 2018-06-25 | Disposition: A | Discharge status: Home / Self Care | Payer: Self-pay | Attending: Emergency Medicine | Admitting: Emergency Medicine

## 2018-06-25 DIAGNOSIS — R1084 Generalized abdominal pain: Secondary | ICD-10-CM | POA: Insufficient documentation

## 2018-06-25 DIAGNOSIS — Z5321 Procedure and treatment not carried out due to patient leaving prior to being seen by health care provider: Secondary | ICD-10-CM | POA: Insufficient documentation

## 2018-06-25 LAB — URINALYSIS, ROUTINE W REFLEX MICROSCOPIC
BACTERIA UA: NONE SEEN
Bilirubin Urine: NEGATIVE
GLUCOSE, UA: NEGATIVE mg/dL
KETONES UR: NEGATIVE mg/dL
Leukocytes, UA: NEGATIVE
Nitrite: NEGATIVE
PROTEIN: NEGATIVE mg/dL
Specific Gravity, Urine: 1.02 (ref 1.005–1.030)
pH: 5 (ref 5.0–8.0)

## 2018-06-25 NOTE — ED Triage Notes (Signed)
Pt c/o R flank pain worse with movement and urination. States urine is dark red/orange, denies pyridum use. CBG has been running in the 300s per pt.

## 2018-06-26 LAB — URINE CULTURE

## 2018-06-26 NOTE — ED Notes (Signed)
Follow up call made  Pt states she is going to return for tx  06/26/18  0933  s Devynn Scheff rn

## 2018-09-11 ENCOUNTER — Encounter (HOSPITAL_COMMUNITY): Payer: Self-pay | Admitting: Physical Therapy

## 2018-09-11 ENCOUNTER — Other Ambulatory Visit: Payer: Self-pay

## 2018-09-11 ENCOUNTER — Ambulatory Visit (HOSPITAL_COMMUNITY): Payer: Self-pay | Attending: *Deleted | Admitting: Physical Therapy

## 2018-09-11 DIAGNOSIS — R262 Difficulty in walking, not elsewhere classified: Secondary | ICD-10-CM | POA: Insufficient documentation

## 2018-09-11 DIAGNOSIS — M79672 Pain in left foot: Secondary | ICD-10-CM | POA: Insufficient documentation
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
## 2018-09-11 NOTE — Patient Instructions (Addendum)
ROM: Inversion / Eversion    With left leg relaxed, gently turn ankle and foot in and out. Move through full range of motion. Avoid pain. Repeat 10____ times per set. Do __1__ sets per session. Do _3___ sessions per day.  http://orth.exer.us/36   Copyright  VHI. All rights reserved.  ROM: Plantar / Dorsiflexion    With left leg relaxed, gently flex and extend ankle. Move through full range of motion. Avoid pain. Repeat __10__ times per set. Do _1___ sets per session. Do __3__ sessions per day.  http://orth.exer.us/34   Copyright  VHI. All rights reserved.  Dorsiflexion: Resisted    Facing anchor, tubing around left foot, pull toward face.  Repeat ____ times per set. Do ____ sets per session. Do ____ sessions per day.  http://orth.exer.us/8   Copyright  VHI. All rights reserved.  Eversion: Resisted    With right foot in tubing loop, hold tubing around other foot to resist and turn foot out. Repeat _10___ times per set. Do _1___ sets per session. Do _2___ sessions per day.  http://orth.exer.us/14   Copyright  VHI. All rights reserved.  Inversion: Resisted    Cross legs with right leg underneath, foot in tubing loop. Hold tubing around other foot to resist and turn foot in. Repeat _10___ times per set. Do _1___ sets per session. Do _2___ sessions per day.  http://orth.exer.us/12   Copyright  VHI. All rights reserved.  Plantar Flexion: Resisted    Anchor behind, tubing around left foot, press down. Repeat __10__ times per set. Do _1___ sets per session. Do __2__ sessions per day.  http://orth.exer.us/10   Copyright  VHI. All rights reserved.  Toe Curl: Unilateral    With right foot resting on towel, slowly bunch up towel by curling toes. Repeat __20__ times per set. Do __12__ sets per session. Do _2___ sessions per day.  http://orth.exer.us/18   Copyright  VHI. All rights reserved.

## 2018-09-19 ENCOUNTER — Telehealth (HOSPITAL_COMMUNITY): Payer: Self-pay | Admitting: Physical Therapy

## 2018-09-19 ENCOUNTER — Ambulatory Visit (HOSPITAL_COMMUNITY): Payer: Self-pay | Admitting: Physical Therapy

## 2018-09-19 NOTE — Telephone Encounter (Signed)
Called pt re missed appointment.  PT was not available but spoke to daughter who stated that she would remind pt of her next appointment.   Katrina Good, PT CLT [phone]

## 2018-09-25 ENCOUNTER — Ambulatory Visit (HOSPITAL_COMMUNITY): Payer: Self-pay | Admitting: Physical Therapy

## 2018-09-25 DIAGNOSIS — M79672 Pain in left foot: Secondary | ICD-10-CM

## 2018-09-25 DIAGNOSIS — R262 Difficulty in walking, not elsewhere classified: Secondary | ICD-10-CM

## 2018-09-25 NOTE — Therapy (Signed)
[facility] [address] [place], [place], [address] Phone: [phone]   Fax:  [phone]  Physical Therapy Treatment  Patient Details  Name: Katrina Good MRN: [id number] Date of Birth: 08-15-1971 Referring Provider (PT): Ferman Hamming    Encounter Date: 09/25/2018  PT End of Session - 09/25/18 1154    Visit Number  2    Number of Visits  4    Date for PT Re-Evaluation  10/11/18    Authorization Type  no insurance     Authorization - Visit Number  2    Authorization - Number of Visits  4    PT Start Time  1045    PT Stop Time  1123    PT Time Calculation (min)  38 min    Activity Tolerance  Patient tolerated treatment well    Behavior During Therapy  [facility] for tasks assessed/performed       Past Medical History:  Diagnosis Date  . Acid reflux   . Asthma   . Bronchitis   . Chronic heel pain, left   . Diabetes mellitus   . Hypertension   . Substance abuse (HCC)    pt denies  . Suicidal thoughts    pt denies    Past Surgical History:  Procedure Laterality Date  . ABDOMINAL HYSTERECTOMY    . CESAREAN SECTION     x 2    There were no vitals filed for this visit.  Subjective Assessment - 09/25/18 1053    Subjective  Pt states no pain currently.  States she is weaning herself off the cam boot, wearing it only several hours at the time.  States she did not wear it all day on saturday or sunday.      Currently in Pain?  No/denies                       [facility] Adult PT Treatment/Exercise - 09/25/18 0001      Ankle Exercises: Standing   SLS  16" max of 3    Heel Raises  10 reps    Other Standing Ankle Exercises  tandem stance 30" each X 2 no UE's    Other Standing Ankle Exercises  squats in front of chair for form 10 reps      Ankle Exercises: Seated   Towel Crunch  Limitations   warmup during subjective intake   Toe Raise  10 reps    Other Seated Ankle Exercises  reveiwed theraband all  directions for HEP             PT Education - 09/25/18 1159    Education Details  reviewed goals and HEP. Instructed to wear her regular shoe to therapy.    Person(s) Educated  Patient    Methods  Explanation;Demonstration    Comprehension  Verbalized understanding;Returned demonstration;Need further instruction       PT Short Term Goals - 09/25/18 1055      PT SHORT TERM GOAL #1   Title  Pt to be wearing the cam boot for only 3-4 hours throughout the day.     Time  2    Period  Weeks    Status  Achieved      PT SHORT TERM GOAL #2   Title  PT to be able to stand for 20 mintues at a time in normal shoes     Time  2    Period  Weeks  Status  On-going      PT SHORT TERM GOAL #3   Title  Pt to be able to walk for up to 30 minutes at a time with pain no greater than a 5/10     Time  2    Period  Weeks    Status  On-going        PT Long Term Goals - 09/25/18 1055      PT LONG TERM GOAL #1   Title  PT to no longer be wearing the camboot     Time  4    Period  Weeks    Status  On-going      PT LONG TERM GOAL #2   Title  PT to be able to stand for 40 minutes at a time with pain no greater than a 2/10    Time  4    Period  Weeks    Status  On-going      PT LONG TERM GOAL #3   Title  PT ankle strength to be at least 4+/5 to allow pt  to be able to walk for 45 minutes without increased pain to be able to gain employment.     Time  4    Period  Weeks    Status  On-going            Plan - 09/25/18 1155    Clinical Impression Statement  PT was late for appointment this session.  Reviewed goals per evaluation.   Reported she was unsure if she has been doing her HEP correctly.  Reviewed with mod assist for correct form and hold times with theraband exercises.  Pt did not bring a shoe so did not work on gait this session.  Began standing SLS and tandem stance as well as heelraises and squats.  Verbal cues needed with all to complete in correct form and decrease UE  assist.  Pt c/o discomfort in her forefoot during exercises, otherwise no issues.  Did not add to HEP this session as patient is a slow learner and will need further instruction with HEP already given.      Rehab Potential  Good    PT Frequency  1x / week    PT Duration  4 weeks    PT Treatment/Interventions  ADLs/Self Care Home Management;Patient/family education;Therapeutic exercise;Balance training;Therapeutic activities;Functional mobility training;Stair training;Gait training    PT Next Visit Plan  Progress balance activity and LE strengthening. Begin working on normalizing gt outside of camboot if remembers her normal shoe next session. Update HEP as appropriate, review theraband exercises.      PT Home Exercise Plan  ROM, t-band for all motion as well as toe curls.     Consulted and Agree with Plan of Care  Patient       Patient will benefit from skilled therapeutic intervention in order to improve the following deficits and impairments:  Decreased activity tolerance, Decreased balance, Increased edema, Pain, Decreased strength  Visit Diagnosis: Pain in left foot  Difficulty in walking, not elsewhere classified     Problem List Patient Active Problem List   Diagnosis Date Noted  . Puncture wound of left foot with complication 05/03/2018  . Calcaneus fracture, left 05/03/2018  . Cocaine abuse (HCC) 05/03/2018  . Cellulitis of left foot 05/02/2018  . Hypokalemia   . Colles' fracture of right radius 03/13/2012   Katrina Good, PTA/CLT [phone]  Emeline Gins B 09/25/2018, 12:02 PM  [name]  [facility] [address] [place], [place], [address] Phone: [phone]   Fax:  [phone]  Name: Katrina Good MRN: [id number] Date of Birth: 11/04/1970

## 2018-10-02 ENCOUNTER — Telehealth (HOSPITAL_COMMUNITY): Payer: Self-pay | Admitting: *Deleted

## 2018-10-02 ENCOUNTER — Ambulatory Visit (HOSPITAL_COMMUNITY): Payer: Self-pay | Admitting: Physical Therapy

## 2018-10-02 NOTE — Telephone Encounter (Signed)
10/02/18  pt cx said that she was moving today

## 2018-10-09 ENCOUNTER — Telehealth (HOSPITAL_COMMUNITY): Payer: Self-pay | Admitting: Physical Therapy

## 2018-10-09 ENCOUNTER — Ambulatory Visit (HOSPITAL_COMMUNITY): Payer: Self-pay | Attending: *Deleted | Admitting: Physical Therapy

## 2018-10-09 DIAGNOSIS — R262 Difficulty in walking, not elsewhere classified: Secondary | ICD-10-CM

## 2018-10-09 DIAGNOSIS — M79672 Pain in left foot: Secondary | ICD-10-CM

## 2018-10-09 NOTE — Therapy (Signed)
[facility] [address] [place], [place], [address] Phone: [phone]   Fax:  [phone]  Physical Therapy Treatment  Patient Details  Name: Katrina Good MRN: [id number] Date of Birth: May 20, 1971 Referring Provider (PT): Ferman Hamming    Encounter Date: 10/09/2018  PT End of Session - 10/09/18 1433    Visit Number  3    Number of Visits  4    Date for PT Re-Evaluation  10/11/18    Authorization Type  no insurance     Authorization - Visit Number  3    Authorization - Number of Visits  4    PT Start Time  1350    PT Stop Time  1430    PT Time Calculation (min)  40 min    Activity Tolerance  Patient tolerated treatment well    Behavior During Therapy  [facility] for tasks assessed/performed       Past Medical History:  Diagnosis Date  . Acid reflux   . Asthma   . Bronchitis   . Chronic heel pain, left   . Diabetes mellitus   . Hypertension   . Substance abuse (HCC)    pt denies  . Suicidal thoughts    pt denies    Past Surgical History:  Procedure Laterality Date  . ABDOMINAL HYSTERECTOMY    . CESAREAN SECTION     x 2    There were no vitals filed for this visit.  Subjective Assessment - 10/09/18 1357    Subjective  pt returns today after missing 2 weeks of therapy.  comes without CAM boot, wearing regular tennis shoes and antalgic.  Pt states she has not worn her boot since Friday (3 days) and it feels terrible to day 7/10 pain in back of calf..      Currently in Pain?  Yes    Pain Score  7     Pain Location  Calf    Pain Orientation  Left    Pain Descriptors / Indicators  Throbbing    Pain Type  Acute pain         [facility] PT Assessment - 10/09/18 0001      Assessment   Medical Diagnosis  s/p left calcaneous fx with difficulty walking     Referring Provider (PT)  Ferman Hamming     Onset Date/Surgical Date  04/29/18    Next MD Visit  unknown      Restrictions   Weight Bearing Restrictions  No      ROM / Strength   AROM / PROM / Strength  AROM;Strength      AROM   AROM Assessment Site  Ankle    Right/Left Ankle  Left;Right    Right Ankle Dorsiflexion  15    Right Ankle Plantar Flexion  45    Right Ankle Inversion  30    Right Ankle Eversion  25    Left Ankle Dorsiflexion  0   was WNL   Left Ankle Plantar Flexion  45    Left Ankle Inversion  22   was WNL   Left Ankle Eversion  10   was WNL     Strength   Right/Left Ankle  Left    Left Ankle Dorsiflexion  3+/5   was 4/5   Left Ankle Plantar Flexion  3+/5   was 3+/5   Left Ankle Inversion  3/5   was 4/5   Left Ankle Eversion  3/5   was  4/5                  [facility] Adult PT Treatment/Exercise - 10/09/18 0001      Ankle Exercises: Seated   Towel Crunch  Limitations   1 minute   Towel Inversion/Eversion  5 reps    Heel Raises  10 reps    Toe Raise  10 reps             PT Education - 10/09/18 1442    Education Details  see assessment.  educated on complaince, decline in ROM/strength, wearing CAM boot    Person(s) Educated  Patient    Methods  Explanation    Comprehension  Verbalized understanding       PT Short Term Goals - 10/09/18 1416      PT SHORT TERM GOAL #1   Title  Pt to be wearing the cam boot for only 3-4 hours throughout the day.     Time  2    Period  Weeks    Status  Achieved      PT SHORT TERM GOAL #2   Title  PT to be able to stand for 20 mintues at a time in normal shoes     Baseline  12/9: only in bedroom shoes or cam boot    Time  2    Period  Weeks    Status  On-going      PT SHORT TERM GOAL #3   Title  Pt to be able to walk for up to 30 minutes at a time with pain no greater than a 5/10     Time  2    Period  Weeks    Status  On-going        PT Long Term Goals - 10/09/18 1417      PT LONG TERM GOAL #1   Title  PT to no longer be wearing the camboot     Time  4    Period  Weeks    Status  On-going      PT LONG TERM GOAL #2   Title  PT to be able to  stand for 40 minutes at a time with pain no greater than a 2/10    Time  4    Period  Weeks    Status  On-going      PT LONG TERM GOAL #3   Title  PT ankle strength to be at least 4+/5 to allow pt  to be able to walk for 45 minutes without increased pain to be able to gain employment.     Time  4    Period  Weeks    Status  On-going            Plan - 10/09/18 1434    Clinical Impression Statement  Pt has made 3 visits in the past 4 weeks due to she has been moving.  Pt returned this session with antalgic gait and noted decline in ROM and strength of Lt ankle.  Pt was suppose to wean out of her CAM boot and she decided to quit wearing it on Friday and has not worn it since.  Noted increased soreness, pain and dysfunction.  Educated she has to wean out and to go back to 4 hours until the soreness/pain reduces.  Also educated that her ROM should be priority at this point and given exercises to help this.  Educated that she will need to come regularly and  complete her HEP to make gains.  Pt verbalized understanding.     Rehab Potential  Good    PT Frequency  1x / week    PT Duration  4 weeks    PT Treatment/Interventions  ADLs/Self Care Home Management;Patient/family education;Therapeutic exercise;Balance training;Therapeutic activities;Functional mobility training;Stair training;Gait training    PT Next Visit Plan  Focus on restoring normal ROM in Lt ankle then progress to LE strengthening and balance. Begin working on normalizing gt outside of cam boot when soreness decreases.     PT Home Exercise Plan  ROM, t-band for all motion as well as toe curls.     Consulted and Agree with Plan of Care  Patient       Patient will benefit from skilled therapeutic intervention in order to improve the following deficits and impairments:  Decreased activity tolerance, Decreased balance, Increased edema, Pain, Decreased strength  Visit Diagnosis: Pain in left foot  Difficulty in walking, not  elsewhere classified     Problem List Patient Active Problem List   Diagnosis Date Noted  . Puncture wound of left foot with complication 05/03/2018  . Calcaneus fracture, left 05/03/2018  . Cocaine abuse (HCC) 05/03/2018  . Cellulitis of left foot 05/02/2018  . Hypokalemia   . Colles' fracture of right radius 03/13/2012   Lurena NidaAmy B Frazier, PTA/CLT [phone]  Lurena NidaFrazier, Amy B 10/09/2018, 2:45 PM  [facility] [address] [place], [place], [address] Phone: [phone]   Fax:  [phone]  Name: Josetta HuddleKimberly A Boccio MRN: [id number] Date of Birth: 02-23-1971

## 2018-10-09 NOTE — Telephone Encounter (Signed)
Pt requested to come in later due to car not starting this morning. NF

## 2018-10-09 NOTE — Telephone Encounter (Signed)
Called pt re her missed appointment.  Left message with family member that her next appointment is on 12/16 and if she can not make it please call as there are individuals on the wait list who would benefit from coming to treatment.  Katrina Good, PT CLT [phone]

## 2018-10-16 ENCOUNTER — Ambulatory Visit (HOSPITAL_COMMUNITY): Payer: Self-pay | Admitting: Physical Therapy

## 2018-10-17 ENCOUNTER — Ambulatory Visit (HOSPITAL_COMMUNITY): Payer: Self-pay

## 2018-10-17 ENCOUNTER — Encounter (HOSPITAL_COMMUNITY): Payer: Self-pay

## 2018-10-17 DIAGNOSIS — R262 Difficulty in walking, not elsewhere classified: Secondary | ICD-10-CM

## 2018-10-17 DIAGNOSIS — M79672 Pain in left foot: Secondary | ICD-10-CM

## 2018-10-17 NOTE — Therapy (Addendum)
[facility] [address] [place], [place], [address] Phone: [phone]   Fax:  [phone]  Physical Therapy Treatment  Patient Details  Name: Katrina HuddleKimberly A Kattner MRN: [id number] Date of Birth: 04/03/1971 Referring Provider (PT): Ferman HammingBenjamin McKinney    Encounter Date: 10/17/2018  PT End of Session - 10/17/18 1309    Visit Number  4    Number of Visits  8    Date for PT Re-Evaluation  11/15/18    Authorization Type  no insurance     Authorization Time Period  cert: 56/38-->7/56/4312/09-->11/15/18    Authorization - Visit Number  4    Authorization - Number of Visits  8    PT Start Time  1301    PT Stop Time  1342    PT Time Calculation (min)  41 min    Activity Tolerance  Patient tolerated treatment well;No increased pain    Behavior During Therapy  [facility] for tasks assessed/performed       Past Medical History:  Diagnosis Date  . Acid reflux   . Asthma   . Bronchitis   . Chronic heel pain, left   . Diabetes mellitus   . Hypertension   . Substance abuse (HCC)    pt denies  . Suicidal thoughts    pt denies    Past Surgical History:  Procedure Laterality Date  . ABDOMINAL HYSTERECTOMY    . CESAREAN SECTION     x 2    There were no vitals filed for this visit.  Subjective Assessment - 10/17/18 1302    Subjective  Pt reports she returned to CAM boot to wearing 4 hr then without for an hour and plans to gradually progress.  Current pain scale 5-6/10 intermittent sharp and throbbing pain, feels the weather plays a part in the rain.  Reports decreased in edema but increased cramping in her foot    Pertinent History  DM, HTN    Patient Stated Goals  less pain     Currently in Pain?  Yes    Pain Score  6     Pain Location  Calf    Pain Orientation  Left    Pain Descriptors / Indicators  Throbbing    Pain Onset  More than a month ago    Pain Frequency  Intermittent    Aggravating Factors   walking,weight bearing    Pain Relieving Factors   rest, self massage    Effect of Pain on Daily Activities  limits                       [facility] Adult PT Treatment/Exercise - 10/17/18 0001      Ambulation/Gait   Ambulation Distance (Feet)  226 Feet    Assistive device  None    Gait Pattern  Step-through pattern;Decreased dorsiflexion - left;Decreased stance time - right;Decreased stride length    Gait Comments  cueing for heel to toe mechanics      Ankle Exercises: Seated   Heel Raises  10 reps    Toe Raise  10 reps      Ankle Exercises: Standing   Vector Stance  Right;Left;3 reps;5 seconds    SLS  Rt 19", Lt 20" wiht 2 intermittent HHA    Rocker Board  2 minutes   lateral and DF/PF   Heel Raises  10 reps   incline slope   Toe Raise  10 reps   incline slope  Other Standing Ankle Exercises  tandem stance on foam 30" each X 2 no UE's    Other Standing Ankle Exercises  squats in front of chair with cueing  for form 10 reps      Ankle Exercises: Stretches   Slant Board Stretch  3 reps;30 seconds             PT Education - 10/17/18 1306    Education Details  Discussion held with weaning out of CAM boot, compliance with attendance, and paperwork for financial aid- reports complete just need to turn in.      Methods  Explanation    Comprehension  Verbalized understanding       PT Short Term Goals - 10/09/18 1416      PT SHORT TERM GOAL #1   Title  Pt to be wearing the cam boot for only 3-4 hours throughout the day.     Time  2    Period  Weeks    Status  Achieved      PT SHORT TERM GOAL #2   Title  PT to be able to stand for 20 mintues at a time in normal shoes     Baseline  12/9: only in bedroom shoes or cam boot    Time  2    Period  Weeks    Status  On-going      PT SHORT TERM GOAL #3   Title  Pt to be able to walk for up to 30 minutes at a time with pain no greater than a 5/10     Time  2    Period  Weeks    Status  On-going        PT Long Term Goals - 10/09/18 1417      PT LONG  TERM GOAL #1   Title  PT to no longer be wearing the camboot     Time  4    Period  Weeks    Status  On-going      PT LONG TERM GOAL #2   Title  PT to be able to stand for 40 minutes at a time with pain no greater than a 2/10    Time  4    Period  Weeks    Status  On-going      PT LONG TERM GOAL #3   Title  PT ankle strength to be at least 4+/5 to allow pt  to be able to walk for 45 minutes without increased pain to be able to gain employment.     Time  4    Period  Weeks    Status  On-going            Plan - 10/17/18 1309    Clinical Impression Statement  Beginning of session discussing attendance, weaning out of CAM boot and financial aide paperwork, reports she has completed paperwork just needs to turn in later today.  Pt stated she has wishes to continue therapy and she continues to have pain and difficulty with gait and standing.  Session focus on ankle mobility and improving gait mechanics.  Add rockerboard and gastroc stretches to improve weight distribution with gait and mobility.  Min cueing for heel strike and equal stance phase with gait.  Added balance activities for ankle stability as well.  No reports of increased pain through session.      Rehab Potential  Good    PT Frequency  1x / week  PT Duration  4 weeks   1x/week for 4 more weeks   PT Treatment/Interventions  ADLs/Self Care Home Management;Patient/family education;Therapeutic exercise;Balance training;Therapeutic activities;Functional mobility training;Stair training;Gait training    PT Next Visit Plan  Focus on restoring normal ROM in Lt ankle then progress to LE strengthening and balance. Begin working on normalizing gt outside of cam boot when soreness decreases.     PT Home Exercise Plan  ROM, t-band for all motion as well as toe curls.        Patient will benefit from skilled therapeutic intervention in order to improve the following deficits and impairments:  Decreased activity tolerance, Decreased  balance, Increased edema, Pain, Decreased strength  Visit Diagnosis: Pain in left foot  Difficulty in walking, not elsewhere classified     Problem List Patient Active Problem List   Diagnosis Date Noted  . Puncture wound of left foot with complication 05/03/2018  . Calcaneus fracture, left 05/03/2018  . Cocaine abuse (HCC) 05/03/2018  . Cellulitis of left foot 05/02/2018  . Hypokalemia   . Colles' fracture of right radius 03/13/2012   Becky Sax, LPTA; CBIS [phone]  Juel Burrow 10/17/2018, 3:14 PM  [facility] [address] [place], [place], [address] Phone: [phone]   Fax:  [phone]  Name: PHILIPPA VESSEY MRN: [id number] Date of Birth: 04-28-1971

## 2018-10-23 ENCOUNTER — Telehealth (HOSPITAL_COMMUNITY): Payer: Self-pay | Admitting: Physical Therapy

## 2018-10-23 ENCOUNTER — Ambulatory Visit (HOSPITAL_COMMUNITY): Payer: Self-pay | Admitting: Physical Therapy

## 2018-10-23 NOTE — Telephone Encounter (Signed)
She is not feeling well and is heading to the ED.

## 2018-11-02 ENCOUNTER — Ambulatory Visit (HOSPITAL_COMMUNITY): Payer: Self-pay

## 2018-11-02 ENCOUNTER — Telehealth (HOSPITAL_COMMUNITY): Payer: Self-pay

## 2018-11-02 NOTE — Telephone Encounter (Signed)
Cx today lack of transportaton r/s for Friday. NF 11/02/18

## 2018-11-03 ENCOUNTER — Ambulatory Visit (HOSPITAL_COMMUNITY): Payer: Self-pay

## 2018-11-07 ENCOUNTER — Encounter (HOSPITAL_COMMUNITY): Payer: Self-pay | Admitting: Physical Therapy

## 2018-11-08 ENCOUNTER — Telehealth (HOSPITAL_COMMUNITY): Payer: Self-pay

## 2018-11-08 ENCOUNTER — Ambulatory Visit (HOSPITAL_COMMUNITY): Payer: Self-pay | Attending: *Deleted

## 2018-11-08 DIAGNOSIS — R262 Difficulty in walking, not elsewhere classified: Secondary | ICD-10-CM | POA: Insufficient documentation

## 2018-11-08 NOTE — Telephone Encounter (Signed)
No show.  Called contact number and spoke to daughter as she was away from home.  Included next apt date and time with contact information.  Also educated no-show policy and needs to schedule apts. one at a time.    [address] LPTA; CBIS [phone]

## 2018-11-10 ENCOUNTER — Telehealth (HOSPITAL_COMMUNITY): Payer: Self-pay | Admitting: Physical Therapy

## 2018-11-10 NOTE — Telephone Encounter (Signed)
Patient called concerning her no shows. I explain to her that she can only make one appt at a time.

## 2018-11-14 ENCOUNTER — Telehealth (HOSPITAL_COMMUNITY): Payer: Self-pay | Admitting: Physical Therapy

## 2018-11-14 ENCOUNTER — Ambulatory Visit (HOSPITAL_COMMUNITY): Payer: Self-pay | Admitting: Physical Therapy

## 2018-11-14 NOTE — Telephone Encounter (Signed)
Patient don't have any transportation and made an appt for 11-22-18

## 2018-11-21 ENCOUNTER — Encounter (HOSPITAL_COMMUNITY): Payer: Self-pay | Admitting: Physical Therapy

## 2018-11-22 ENCOUNTER — Encounter (HOSPITAL_COMMUNITY): Payer: Self-pay | Admitting: Physical Therapy

## 2018-11-22 ENCOUNTER — Ambulatory Visit (HOSPITAL_COMMUNITY): Payer: Self-pay | Admitting: Physical Therapy

## 2018-11-22 DIAGNOSIS — R262 Difficulty in walking, not elsewhere classified: Secondary | ICD-10-CM
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
## 2018-11-22 NOTE — Patient Instructions (Addendum)
Knee Extension (Sitting)    Place __2__ pound weight on left ankle and straighten knee fully, lower slowly. Repeat ___10_ times per set. Do _1___ sets per session. Do _3___ sessions per day.  http://orth.exer.us/732   Copyright  VHI. All rights reserved.  Heel Raise: Bilateral (Standing)   Holding onto the kitchen counter  Rise on balls of feet. Repeat 10____ times per set. Do 1____ sets per session. Do ____2 sessions per day.  http://orth.exer.us/38   Copyright  VHI. All rights reserved.  Functional Quadriceps: Chair Squat    Keeping feet flat on floor, shoulder width apart, squat as low as is comfortable. Use support as necessary. Repeat _10___ times per set. Do __1__ sets per session. Do ___2_ sessions per day.  http://orth.exer.us/736   Copyright  VHI. All rights reserved.  Functional Quadriceps: Sit to Stand    Sit on edge of chair, feet flat on floor. Stand upright, extending knees fully. Repeat _10___ times per set. Do _1___ sets per session. Do __2__ sessions per day.  http://orth.exer.us/734   Copyright  VHI. All rights reserved.  Bridging    Slowly raise buttocks from floor, keeping stomach tight. Repeat _10___ times per set. Do _1__ sets per session. Do _2___ sessions per day.  http://orth.exer.us/1096   Copyright  VHI. All rights reserved.  Strengthening: Straight Leg Raise (Phase 1)    Tighten muscles on front of right thigh, then lift leg _18___ inches from surface, keeping knee locked.  Repeat __10__ times per set. Do _1___ sets per session. Do __2__ sessions per day.2  http://orth.exer.us/614   Copyright  VHI. All rights reserved.

## 2019-09-18 ENCOUNTER — Encounter (HOSPITAL_COMMUNITY): Payer: Self-pay | Admitting: *Deleted

## 2019-09-18 ENCOUNTER — Other Ambulatory Visit: Payer: Self-pay

## 2019-09-18 ENCOUNTER — Emergency Department (HOSPITAL_COMMUNITY): Payer: Self-pay

## 2019-09-18 ENCOUNTER — Emergency Department (HOSPITAL_COMMUNITY)
Admission: EM | Admit: 2019-09-18 | Discharge: 2019-09-18 | Disposition: A | Discharge status: Home / Self Care | Payer: Self-pay | Attending: Emergency Medicine | Admitting: Emergency Medicine

## 2019-09-18 DIAGNOSIS — Z20828 Contact with and (suspected) exposure to other viral communicable diseases: Secondary | ICD-10-CM | POA: Insufficient documentation

## 2019-09-18 DIAGNOSIS — Z7982 Long term (current) use of aspirin: Secondary | ICD-10-CM | POA: Insufficient documentation

## 2019-09-18 DIAGNOSIS — F17210 Nicotine dependence, cigarettes, uncomplicated: Secondary | ICD-10-CM | POA: Insufficient documentation

## 2019-09-18 DIAGNOSIS — R519 Headache, unspecified: Secondary | ICD-10-CM | POA: Insufficient documentation

## 2019-09-18 DIAGNOSIS — Z20822 Contact with and (suspected) exposure to covid-19: Secondary | ICD-10-CM

## 2019-09-18 DIAGNOSIS — R05 Cough: Secondary | ICD-10-CM | POA: Insufficient documentation

## 2019-09-18 DIAGNOSIS — R509 Fever, unspecified: Secondary | ICD-10-CM | POA: Insufficient documentation

## 2019-09-18 DIAGNOSIS — J45909 Unspecified asthma, uncomplicated: Secondary | ICD-10-CM | POA: Insufficient documentation

## 2019-09-18 DIAGNOSIS — E119 Type 2 diabetes mellitus without complications: Secondary | ICD-10-CM | POA: Insufficient documentation

## 2019-09-18 DIAGNOSIS — Z79899 Other long term (current) drug therapy: Secondary | ICD-10-CM | POA: Insufficient documentation

## 2019-09-18 DIAGNOSIS — I10 Essential (primary) hypertension: Secondary | ICD-10-CM | POA: Insufficient documentation

## 2019-09-18 LAB — INFLUENZA PANEL BY PCR (TYPE A & B)
Influenza A By PCR: NEGATIVE
Influenza B By PCR: NEGATIVE

## 2019-09-18 MED ORDER — ACETAMINOPHEN 325 MG PO TABS
650.0000 mg | ORAL_TABLET | Freq: Once | ORAL | Status: AC
  Administered 2019-09-18: 21:00:00 650 mg via ORAL
  Filled 2019-09-18: qty 2

## 2019-09-18 MED ORDER — PREDNISONE 10 MG PO TABS: 40.0000 mg | ORAL_TABLET | Freq: Every day | ORAL | 0 refills | Status: AC

## 2019-09-18 MED ORDER — IBUPROFEN 800 MG PO TABS
800.0000 mg | ORAL_TABLET | Freq: Once | ORAL | Status: AC
  Administered 2019-09-18: 800 mg via ORAL

## 2019-09-18 NOTE — ED Triage Notes (Signed)
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

## 2019-09-18 NOTE — Discharge Instructions (Signed)
You are seen today for cough, fever, body aches.  It is suspected that you have COVID-19.  Your flu test was negative.  Your x-ray did not show any acute findings.  I am giving you a course of some steroid medication.  I want you to take your albuterol inhaler every 4 hours.  Make sure you quarantine yourself until you have had symptoms for at least 10 days and you have not had a fever for 72 hours.  If you have any new or worsening symptoms then you should return to the emergency department for reevaluation.

## 2019-09-18 NOTE — ED Provider Notes (Signed)
[facility] EMERGENCY DEPARTMENT Provider Note   CSN: [id number] Arrival date & time: 09/18/19  1957     History   Chief Complaint Chief Complaint  Patient presents with  . Generalized Body Aches    HPI Katrina Good is a 48 y.o. female.     Patient is a 48 year old female with past medical history of hypertension, diabetes, asthma presenting to the emergency department for fever, chills, headache, cough, congestion.  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  She is a Lawyer.  Denies any shortness of breath, loss of taste or smell.  She is able to keep down fluids.  She has not tried anything for relief.     Past Medical History:  Diagnosis Date  . Acid reflux   . Asthma   . Bronchitis   . Chronic heel pain, left   . Diabetes mellitus   . Hypertension   . Substance abuse ([facility])    pt denies  . Suicidal thoughts    pt denies    Patient Active Problem List   Diagnosis Date Noted  . Puncture wound of left foot with complication 05/03/2018  . Calcaneus fracture, left 05/03/2018  . Cocaine abuse ([facility]) 05/03/2018  . Cellulitis of left foot 05/02/2018  . Hypokalemia   . Colles' fracture of right radius 03/13/2012    Past Surgical History:  Procedure Laterality Date  . ABDOMINAL HYSTERECTOMY    . CESAREAN SECTION     x 2     OB History    Gravida  5   Para  2   Term  2   Preterm      AB  3   Living  2     SAB  2   TAB  1   Ectopic      Multiple      Live Births               Home Medications    Prior to Admission medications   Medication Sig Start Date End Date Taking? Authorizing Provider  albuterol (PROVENTIL HFA;VENTOLIN HFA) 108 (90 BASE) MCG/ACT inhaler Inhale 2 puffs into the lungs every 6 (six) hours as needed for wheezing.   Yes [provider]  amLODipine (NORVASC) 5 MG tablet Take 1 tablet (5 mg total) by mouth daily. 11/20/17  Yes  Fayrene Helper, PA-C  aspirin EC 81 MG tablet Take 81 mg by mouth daily.   Yes [provider]  DM-Doxylamine-Acetaminophen (NYQUIL COLD & FLU PO) Take 10-15 mLs by mouth daily as needed (for cough).   Yes [provider]  famotidine (PEPCID) 10 MG tablet Take 10 mg by mouth 2 (two) times daily.    Yes [provider]  guaiFENesin (MUCINEX CHEST CONGESTION CHILD) 100 MG/5ML liquid Take 200 mg by mouth 3 (three) times daily as needed for cough.   Yes [provider]  lisinopril (PRINIVIL,ZESTRIL) 5 MG tablet Take 1 tablet (5 mg total) by mouth daily. 11/20/17  Yes Fayrene Helper, PA-C  albuterol (PROVENTIL) (2.5 MG/3ML) 0.083% nebulizer solution Take 2.5 mg by nebulization every 6 (six) hours as needed for wheezing or shortness of breath.    [provider]  predniSONE (DELTASONE) 10 MG tablet Take 4 tablets (40 mg total) by mouth daily for 5 days. 09/18/19 09/23/19  Arlyn Dunning, PA-C    Family History Family History  Problem Relation Age  of Onset  . Diabetes Other   . Heart disease Other   . Arthritis Other   . Lung disease Other   . Cancer Other     Social History Social History   Tobacco Use  . Smoking status: Current Every Day Smoker    Packs/day: 1.00    Years: 25.00    Pack years: 25.00    Types: Cigarettes  . Smokeless tobacco: Never Used  . Tobacco comment: 2-3 packs  Substance Use Topics  . Alcohol use: No    Frequency: Never  . Drug use: Yes    Types: Marijuana, Cocaine    Comment: smoked crack yesterday      Allergies   Doxycycline and Strawberry extract   Review of Systems Review of Systems  Constitutional: Positive for appetite change, chills, fatigue and fever. Negative for activity change.  HENT: Positive for congestion, rhinorrhea and sore throat. Negative for dental problem.   Respiratory: Positive for cough. Negative for shortness of breath.   Cardiovascular: Negative for chest pain.  Gastrointestinal:  Positive for diarrhea and nausea. Negative for abdominal distention, abdominal pain, constipation and vomiting.  Genitourinary: Negative for dysuria.  Musculoskeletal: Positive for myalgias. Negative for arthralgias, back pain, gait problem, joint swelling, neck pain and neck stiffness.  Skin: Negative for rash.  Allergic/Immunologic: Negative for immunocompromised state.  Neurological: Positive for headaches. Negative for dizziness.  Hematological: Does not bruise/bleed easily.     Physical Exam Updated Vital Signs BP 134/78 (BP Location: Left Arm)   Pulse 72   Temp 99 F (37.2 C) (Oral)   Resp 16   Ht 5\' 9"  (1.753 m)   Wt 108.9 kg   SpO2 99%   BMI 35.45 kg/m   Physical Exam Vitals signs and nursing note reviewed.  Constitutional:      General: She is not in acute distress.    Appearance: Normal appearance. She is not ill-appearing, toxic-appearing or diaphoretic.  HENT:     Head: Normocephalic.     Nose: Nose normal.     Mouth/Throat:     Mouth: Mucous membranes are moist.  Eyes:     Conjunctiva/sclera: Conjunctivae normal.  Cardiovascular:     Rate and Rhythm: Normal rate and regular rhythm.  Pulmonary:     Effort: Pulmonary effort is normal.     Breath sounds: Normal breath sounds.  Abdominal:     General: Abdomen is flat. There is no distension.     Tenderness: There is no abdominal tenderness.  Skin:    General: Skin is dry.  Neurological:     Mental Status: She is alert.  Psychiatric:        Mood and Affect: Mood normal.      ED Treatments / Results  Labs (all labs ordered are listed, but only abnormal results are displayed) Labs Reviewed  SARS CORONAVIRUS 2 (TAT 6-24 HRS)  INFLUENZA PANEL BY PCR (TYPE A & B)    EKG None  Radiology Dg Chest Portable 1 View  Result Date: 09/18/2019 CLINICAL DATA:  Cough, fever EXAM: PORTABLE CHEST 1 VIEW COMPARISON:  05/02/2018 FINDINGS: The heart size and mediastinal contours are within normal limits. Again  noted are prominent interstitial markings bilaterally. No focal airspace consolidation. No pleural effusion or pneumothorax. The visualized skeletal structures are unremarkable. IMPRESSION: Findings suggestive of bronchitic change/small airways disease. No focal consolidation. Electronically Signed   By: Davina Poke M.D.   On: 09/18/2019 21:58    Procedures Procedures (including critical care time)  Medications Ordered in ED Medications  ibuprofen (ADVIL) tablet 800 mg (800 mg Oral Given 09/18/19 2100)  acetaminophen (TYLENOL) tablet 650 mg (650 mg Oral Given 09/18/19 2101)     Initial Impression / Assessment and Plan / ED Course  I have reviewed the triage vital signs and the nursing notes.  Pertinent labs & imaging results that were available during my care of the patient were reviewed by me and considered in my medical decision making (see chart for details).  Clinical Course as of Sep 18 2347  Tue Sep 18, 2019  2219 Patient presenting with Covid/flu symptoms since this past Saturday.  Exposure to a person with similar symptoms but unknown Covid status.  Her flu test here is negative today x-ray does not show any pneumonia but does show some bronchitic changes.  She is mildly hypertensive on exam but otherwise afebrile and satting normally on room air.  I am going to give her a course of some steroid medicine and she was advised to continue her albuterol inhaler every 4 hours as needed.  Advised on strict return precautions.  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   [KM]    Clinical Course User Index [KM] Arlyn DunningMcLean, Tajuanna Burnett A, PA-C       Based on review of vitals, medical screening exam, lab work and/or imaging, there does not appear to be an acute, emergent etiology for the patient's symptoms. Counseled pt on good return precautions and encouraged both PCP and ED follow-up as needed.  Prior to  discharge, I also discussed incidental imaging findings with patient in detail and advised appropriate, recommended follow-up in detail.  Clinical Impression: 1. Suspected COVID-19 virus infection     Disposition: Discharge  Prior to providing a prescription for a controlled substance, I independently reviewed the patient's recent prescription history on the [place] Controlled Substance Reporting System. The patient had no recent or regular prescriptions and was deemed appropriate for a brief, less than 3 day prescription of narcotic for acute analgesia.  This note was prepared with assistance of Conservation officer, historic buildingsDragon voice recognition software. Occasional wrong-word or sound-a-like substitutions may have occurred due to the inherent limitations of voice recognition software.   Final Clinical Impressions(s) / ED Diagnoses   Final diagnoses:  Suspected COVID-19 virus infection    ED Discharge Orders         Ordered    predniSONE (DELTASONE) 10 MG tablet  Daily     09/18/19 2239           Jeral PinchMcLean, Ashawna Hanback A, PA-C 09/18/19 2350    Derwood KaplanNanavati, Ankit, MD 09/22/19 1627

## 2019-09-19 LAB — SARS CORONAVIRUS 2 (TAT 6-24 HRS): SARS Coronavirus 2: NEGATIVE

## 2019-09-21 ENCOUNTER — Telehealth: Payer: Self-pay | Admitting: *Deleted

## 2019-09-21 NOTE — Telephone Encounter (Signed)
Negative COVID results given. Patient results "NOT Detected." Caller expressed understanding. ° °

## 2020-01-04 ENCOUNTER — Encounter (HOSPITAL_COMMUNITY): Payer: Self-pay

## 2020-01-04 ENCOUNTER — Emergency Department (HOSPITAL_COMMUNITY): Payer: Self-pay

## 2020-01-04 ENCOUNTER — Emergency Department (HOSPITAL_COMMUNITY)
Admission: EM | Admit: 2020-01-04 | Discharge: 2020-01-04 | Disposition: A | Discharge status: Home / Self Care | Payer: Self-pay | Attending: Emergency Medicine | Admitting: Emergency Medicine

## 2020-01-04 ENCOUNTER — Other Ambulatory Visit: Payer: Self-pay

## 2020-01-04 DIAGNOSIS — E119 Type 2 diabetes mellitus without complications: Secondary | ICD-10-CM | POA: Insufficient documentation

## 2020-01-04 DIAGNOSIS — M549 Dorsalgia, unspecified: Secondary | ICD-10-CM | POA: Insufficient documentation

## 2020-01-04 DIAGNOSIS — F17210 Nicotine dependence, cigarettes, uncomplicated: Secondary | ICD-10-CM | POA: Insufficient documentation

## 2020-01-04 DIAGNOSIS — Z79899 Other long term (current) drug therapy: Secondary | ICD-10-CM | POA: Insufficient documentation

## 2020-01-04 DIAGNOSIS — J45909 Unspecified asthma, uncomplicated: Secondary | ICD-10-CM | POA: Insufficient documentation

## 2020-01-04 DIAGNOSIS — R911 Solitary pulmonary nodule: Secondary | ICD-10-CM | POA: Insufficient documentation

## 2020-01-04 DIAGNOSIS — M542 Cervicalgia: Secondary | ICD-10-CM | POA: Insufficient documentation

## 2020-01-04 DIAGNOSIS — I10 Essential (primary) hypertension: Secondary | ICD-10-CM | POA: Insufficient documentation

## 2020-01-04 DIAGNOSIS — Z7982 Long term (current) use of aspirin: Secondary | ICD-10-CM | POA: Insufficient documentation

## 2020-01-04 LAB — COMPREHENSIVE METABOLIC PANEL
ALT: 23 U/L (ref 0–44)
AST: 21 U/L (ref 15–41)
Albumin: 3.6 g/dL (ref 3.5–5.0)
Alkaline Phosphatase: 124 U/L (ref 38–126)
Anion gap: 5 (ref 5–15)
BUN: 17 mg/dL (ref 6–20)
CO2: 28 mmol/L (ref 22–32)
Calcium: 8.8 mg/dL — ABNORMAL LOW (ref 8.9–10.3)
Chloride: 110 mmol/L (ref 98–111)
Creatinine, Ser: 0.9 mg/dL (ref 0.44–1.00)
GFR calc Af Amer: >60 mL/min (ref >60)
GFR calc non Af Amer: >60 mL/min (ref >60)
Glucose, Bld: 123 mg/dL — ABNORMAL HIGH (ref 70–99)
Potassium: 3.6 mmol/L (ref 3.5–5.1)
Sodium: 143 mmol/L (ref 135–145)
Total Bilirubin: 0.4 mg/dL (ref 0.3–1.2)
Total Protein: 7.3 g/dL (ref 6.5–8.1)

## 2020-01-04 LAB — CBC
HCT: 43.5 % (ref 36.0–46.0)
Hemoglobin: 13.7 g/dL (ref 12.0–15.0)
MCH: 26.9 pg (ref 26.0–34.0)
MCHC: 31.5 g/dL (ref 30.0–36.0)
MCV: 85.5 fL (ref 80.0–100.0)
Platelets: 191 10*3/uL (ref 150–400)
RBC: 5.09 MIL/uL (ref 3.87–5.11)
RDW: 15.3 % (ref 11.5–15.5)
WBC: 5.7 10*3/uL (ref 4.0–10.5)
nRBC: 0 % (ref 0.0–0.2)

## 2020-01-04 MED ORDER — KETOROLAC TROMETHAMINE 30 MG/ML IJ SOLN
15.0000 mg | Freq: Once | INTRAMUSCULAR | Status: AC
  Administered 2020-01-04: 15 mg via INTRAVENOUS
  Filled 2020-01-04: qty 1

## 2020-01-04 MED ORDER — NAPROXEN 500 MG PO TABS: 500.0000 mg | ORAL_TABLET | Freq: Two times a day (BID) | ORAL | 0 refills | Status: DC

## 2020-01-04 MED ORDER — METHOCARBAMOL 500 MG PO TABS: 500.0000 mg | ORAL_TABLET | Freq: Three times a day (TID) | ORAL | 0 refills | Status: DC | PRN

## 2020-01-04 MED ORDER — FENTANYL CITRATE (PF) 100 MCG/2ML IJ SOLN
50.0000 ug | Freq: Once | INTRAMUSCULAR | Status: AC
  Administered 2020-01-04: 50 ug via INTRAVENOUS
  Filled 2020-01-04: qty 2

## 2020-01-04 MED ORDER — IOHEXOL 300 MG/ML  SOLN
100.0000 mL | Freq: Once | INTRAMUSCULAR | Status: AC | PRN
  Administered 2020-01-04: 135 mL via INTRAVENOUS
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
## 2020-03-11 ENCOUNTER — Encounter (HOSPITAL_COMMUNITY): Payer: Self-pay

## 2020-03-11 ENCOUNTER — Emergency Department (HOSPITAL_COMMUNITY)
Admission: EM | Admit: 2020-03-11 | Discharge: 2020-03-11 | Disposition: A | Discharge status: Home / Self Care | Payer: Self-pay | Attending: Emergency Medicine | Admitting: Emergency Medicine

## 2020-03-11 ENCOUNTER — Other Ambulatory Visit: Payer: Self-pay

## 2020-03-11 DIAGNOSIS — Z5321 Procedure and treatment not carried out due to patient leaving prior to being seen by health care provider: Secondary | ICD-10-CM | POA: Insufficient documentation

## 2020-03-11 DIAGNOSIS — M79661 Pain in right lower leg: Secondary | ICD-10-CM | POA: Insufficient documentation

## 2020-03-11 NOTE — ED Notes (Signed)
Not in WR when called 

## 2020-03-11 NOTE — ED Triage Notes (Signed)
Pt presents to ED with complaints of right calf pain. Pt states been going on about 2 days. Pt denies swelling or redness at calf site but states her right knee has been swollen some.

## 2020-06-01 ENCOUNTER — Other Ambulatory Visit: Payer: Self-pay

## 2020-06-01 ENCOUNTER — Emergency Department (HOSPITAL_COMMUNITY): Payer: Self-pay

## 2020-06-01 ENCOUNTER — Encounter (HOSPITAL_COMMUNITY): Payer: Self-pay

## 2020-06-01 ENCOUNTER — Emergency Department (HOSPITAL_COMMUNITY)
Admission: EM | Admit: 2020-06-01 | Discharge: 2020-06-01 | Disposition: A | Discharge status: Home / Self Care | Payer: Self-pay | Attending: Emergency Medicine | Admitting: Emergency Medicine

## 2020-06-01 DIAGNOSIS — J45909 Unspecified asthma, uncomplicated: Secondary | ICD-10-CM | POA: Insufficient documentation

## 2020-06-01 DIAGNOSIS — M79641 Pain in right hand: Secondary | ICD-10-CM

## 2020-06-01 DIAGNOSIS — Z79899 Other long term (current) drug therapy: Secondary | ICD-10-CM | POA: Insufficient documentation

## 2020-06-01 DIAGNOSIS — F17210 Nicotine dependence, cigarettes, uncomplicated: Secondary | ICD-10-CM | POA: Insufficient documentation

## 2020-06-01 DIAGNOSIS — I10 Essential (primary) hypertension: Secondary | ICD-10-CM | POA: Insufficient documentation

## 2020-06-01 DIAGNOSIS — F1210 Cannabis abuse, uncomplicated: Secondary | ICD-10-CM | POA: Insufficient documentation

## 2020-06-01 DIAGNOSIS — E119 Type 2 diabetes mellitus without complications: Secondary | ICD-10-CM | POA: Insufficient documentation

## 2020-06-01 DIAGNOSIS — Z7982 Long term (current) use of aspirin: Secondary | ICD-10-CM | POA: Insufficient documentation

## 2020-06-01 MED ORDER — NAPROXEN 500 MG PO TABS: 500.0000 mg | ORAL_TABLET | Freq: Two times a day (BID) | ORAL | 0 refills | Status: DC

## 2020-06-01 MED ORDER — HYDROCODONE-ACETAMINOPHEN 5-325 MG PO TABS
1.0000 | ORAL_TABLET | Freq: Once | ORAL | Status: AC
  Administered 2020-06-01: 1 via ORAL
  Filled 2020-06-01: qty 1
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
## 2020-06-01 NOTE — Discharge Instructions (Addendum)
You were seen today for hand pain.  Your x-rays do not show any evidence of a broken bone.  Use ice and take naproxen for pain.

## 2020-06-15 ENCOUNTER — Emergency Department (HOSPITAL_COMMUNITY)
Admission: EM | Admit: 2020-06-15 | Discharge: 2020-06-15 | Disposition: A | Discharge status: Home / Self Care | Payer: Self-pay

## 2020-06-15 ENCOUNTER — Other Ambulatory Visit: Payer: Self-pay

## 2020-06-15 NOTE — ED Notes (Signed)
Pt requesting covid test.  Informed pt she would have to be seen by a provider and have a medical screening exam and she said she will go to [facility] to get just a covid test performed.  Pt aware she has the right to a medical screening exam and instructed to return if needed.

## 2020-08-28 ENCOUNTER — Encounter (HOSPITAL_COMMUNITY): Payer: Self-pay | Admitting: Emergency Medicine

## 2020-08-28 ENCOUNTER — Emergency Department (HOSPITAL_COMMUNITY)
Admission: EM | Admit: 2020-08-28 | Discharge: 2020-08-28 | Disposition: A | Discharge status: Home / Self Care | Payer: Self-pay | Attending: Emergency Medicine | Admitting: Emergency Medicine

## 2020-08-28 ENCOUNTER — Other Ambulatory Visit: Payer: Self-pay

## 2020-08-28 ENCOUNTER — Emergency Department (HOSPITAL_COMMUNITY): Payer: Self-pay

## 2020-08-28 DIAGNOSIS — I10 Essential (primary) hypertension: Secondary | ICD-10-CM | POA: Insufficient documentation

## 2020-08-28 DIAGNOSIS — J069 Acute upper respiratory infection, unspecified: Secondary | ICD-10-CM | POA: Insufficient documentation

## 2020-08-28 DIAGNOSIS — Z20822 Contact with and (suspected) exposure to covid-19: Secondary | ICD-10-CM | POA: Insufficient documentation

## 2020-08-28 DIAGNOSIS — E119 Type 2 diabetes mellitus without complications: Secondary | ICD-10-CM | POA: Insufficient documentation

## 2020-08-28 DIAGNOSIS — F17210 Nicotine dependence, cigarettes, uncomplicated: Secondary | ICD-10-CM | POA: Insufficient documentation

## 2020-08-28 DIAGNOSIS — Z7982 Long term (current) use of aspirin: Secondary | ICD-10-CM | POA: Insufficient documentation

## 2020-08-28 DIAGNOSIS — J45909 Unspecified asthma, uncomplicated: Secondary | ICD-10-CM | POA: Insufficient documentation

## 2020-08-28 DIAGNOSIS — Z79899 Other long term (current) drug therapy: Secondary | ICD-10-CM | POA: Insufficient documentation

## 2020-08-28 DIAGNOSIS — R569 Unspecified convulsions: Secondary | ICD-10-CM | POA: Insufficient documentation

## 2020-08-28 LAB — COMPREHENSIVE METABOLIC PANEL
ALT: 19 U/L (ref 0–44)
AST: 15 U/L (ref 15–41)
Albumin: 3.6 g/dL (ref 3.5–5.0)
Alkaline Phosphatase: 111 U/L (ref 38–126)
Anion gap: 8 (ref 5–15)
BUN: 8 mg/dL (ref 6–20)
CO2: 26 mmol/L (ref 22–32)
Calcium: 9 mg/dL (ref 8.9–10.3)
Chloride: 104 mmol/L (ref 98–111)
Creatinine, Ser: 0.76 mg/dL (ref 0.44–1.00)
GFR, Estimated: >60 mL/min (ref >60)
Glucose, Bld: 98 mg/dL (ref 70–99)
Potassium: 3.9 mmol/L (ref 3.5–5.1)
Sodium: 138 mmol/L (ref 135–145)
Total Bilirubin: 0.9 mg/dL (ref 0.3–1.2)
Total Protein: 7.7 g/dL (ref 6.5–8.1)

## 2020-08-28 LAB — CBC WITH DIFFERENTIAL/PLATELET
Abs Immature Granulocytes: 0.03 10*3/uL (ref 0.00–0.07)
Basophils Absolute: 0.1 10*3/uL (ref 0.0–0.1)
Basophils Relative: 1 %
Eosinophils Absolute: 0.2 10*3/uL (ref 0.0–0.5)
Eosinophils Relative: 2 %
HCT: 47.1 % — ABNORMAL HIGH (ref 36.0–46.0)
Hemoglobin: 15.3 g/dL — ABNORMAL HIGH (ref 12.0–15.0)
Immature Granulocytes: 0 %
Lymphocytes Relative: 24 %
Lymphs Abs: 2.3 10*3/uL (ref 0.7–4.0)
MCH: 26.9 pg (ref 26.0–34.0)
MCHC: 32.5 g/dL (ref 30.0–36.0)
MCV: 82.8 fL (ref 80.0–100.0)
Monocytes Absolute: 1 10*3/uL (ref 0.1–1.0)
Monocytes Relative: 11 %
Neutro Abs: 5.9 10*3/uL (ref 1.7–7.7)
Neutrophils Relative %: 62 %
Platelets: 198 10*3/uL (ref 150–400)
RBC: 5.69 MIL/uL — ABNORMAL HIGH (ref 3.87–5.11)
RDW: 16.8 % — ABNORMAL HIGH (ref 11.5–15.5)
WBC: 9.5 10*3/uL (ref 4.0–10.5)
nRBC: 0 % (ref 0.0–0.2)

## 2020-08-28 LAB — URINALYSIS, ROUTINE W REFLEX MICROSCOPIC
Bacteria, UA: NONE SEEN
Bilirubin Urine: NEGATIVE
Glucose, UA: NEGATIVE mg/dL
Ketones, ur: NEGATIVE mg/dL
Leukocytes,Ua: NEGATIVE
Nitrite: NEGATIVE
Protein, ur: NEGATIVE mg/dL
Specific Gravity, Urine: 1.005 (ref 1.005–1.030)
pH: 7 (ref 5.0–8.0)

## 2020-08-28 LAB — RAPID URINE DRUG SCREEN, HOSP PERFORMED
Amphetamines: NOT DETECTED
Barbiturates: NOT DETECTED
Benzodiazepines: NOT DETECTED
Cocaine: POSITIVE — AB
Opiates: NOT DETECTED
Tetrahydrocannabinol: NOT DETECTED

## 2020-08-28 LAB — RESPIRATORY PANEL BY RT PCR (FLU A&B, COVID)
Influenza A by PCR: NEGATIVE
Influenza B by PCR: NEGATIVE
SARS Coronavirus 2 by RT PCR: NEGATIVE

## 2020-08-28 MED ORDER — METOCLOPRAMIDE HCL 5 MG/ML IJ SOLN
10.0000 mg | Freq: Once | INTRAMUSCULAR | Status: AC
  Administered 2020-08-28: 10 mg via INTRAVENOUS
  Filled 2020-08-28: qty 2

## 2020-08-28 MED ORDER — LEVETIRACETAM 500 MG PO TABS: 500.0000 mg | ORAL_TABLET | Freq: Two times a day (BID) | ORAL | 0 refills | Status: DC

## 2020-08-28 MED ORDER — KETOROLAC TROMETHAMINE 30 MG/ML IJ SOLN
15.0000 mg | Freq: Once | INTRAMUSCULAR | Status: AC
  Administered 2020-08-28: 15 mg via INTRAVENOUS
  Filled 2020-08-28: qty 1

## 2020-08-28 MED ORDER — GADOBUTROL 1 MMOL/ML IV SOLN
10.0000 mL | Freq: Once | INTRAVENOUS | Status: AC | PRN
  Administered 2020-08-28: 10 mL via INTRAVENOUS

## 2020-08-28 MED ORDER — SODIUM CHLORIDE 0.9 % IV BOLUS
500.0000 mL | Freq: Once | INTRAVENOUS | Status: AC
  Administered 2020-08-28: 500 mL via INTRAVENOUS

## 2020-08-28 MED ORDER — DIPHENHYDRAMINE HCL 50 MG/ML IJ SOLN
25.0000 mg | Freq: Once | INTRAMUSCULAR | Status: AC
  Administered 2020-08-28: 25 mg via INTRAVENOUS
  Filled 2020-08-28: qty 1

## 2020-08-28 MED ORDER — LEVETIRACETAM IN NACL 1000 MG/100ML IV SOLN
1000.0000 mg | Freq: Once | INTRAVENOUS | Status: AC
  Administered 2020-08-28: 1000 mg via INTRAVENOUS
  Filled 2020-08-28: qty 100

## 2020-08-28 NOTE — Discharge Instructions (Addendum)
Your head imaging and lab work were reassuring today.  I spoke with Dr. Gerilyn Pilgrim with neurology and he would like for you to start taking Keppra 500 mg twice daily, it is very important that you take this medication regularly for it to be effective.  Until you have had further evaluation with neurology regarding these possible seizures you are not to drive a car, no swimming or anything else that would be potentially dangerous if you were to have a seizure while doing that activity.  Please call to schedule close follow-up with Dr. Gerilyn Pilgrim.  Your Covid and flu test were negative today.  Suspect you have a viral respiratory infection, please treat supportively with Motrin, Tylenol and over-the-counter cough medications.  If your symptoms are not improving you should have a repeat Covid test in a few days.  Return for any new or worsening symptoms.

## 2020-08-28 NOTE — ED Triage Notes (Signed)
Patient brought in [facility] from home. Alert and oriented. Airway patent. Patient c/o cough, fever, nausea, headache, and body achess. Mucinex DM, Nightquil, ibuprofen, and advil. Patient last took medication at 4am with relief.

## 2020-08-28 NOTE — ED Provider Notes (Signed)
[facility] EMERGENCY DEPARTMENT Provider Note   CSN: [id number] Arrival date & time: 08/28/20  2130     History Chief Complaint  Patient presents with  . Cough    Katrina Good is a 49 y.o. female.  Katrina Good is a 49 y.o. female with a hypertension, diabetes, asthma and substance abuse, who presents for evaluation of cough.  Patient states that for the past 3 days she has been feeling poorly with reported productive cough, chills, body aches, headaches.  Patient denies any known sick contacts, but is not vaccinated for Covid.  She has not had any noted fevers and is not febrile on arrival.  She states cough is productive of mucus.  She states chest pain is present only with coughing spells, and that she has felt intermittently short of breath, primarily with coughing.  She reports poor appetite and that she has had a few episodes of vomiting, but denies focal abdominal pain, just states she feels sore from coughing.  Has had a few episodes of diarrhea, no blood in the stool.  Also complains of headaches, but denies any neck pain or stiffness.  Patient has been taking over-the-counter medications such as Mucinex, NyQuil and ibuprofen, she does report some relief in her symptoms, last took medicine at 4 AM.  Arrives via EMS.  Patient then mentions that she thinks she has been having seizure-like episodes for the past week, she denies any prior history but reports that even before she started feeling poorly she has had 6 episodes in the past week where she states that it starts in her right foot and she feels like she cannot control the movement of her foot and toes and then the sensation moves up the right side of her body into her arm as well and she states that her arm seems to bend up and hands bend up and she cannot control her movements.  She states she remembers some aspects of these episodes but not in their entirety, she is not sure if she completely loses consciousness, but states  that she does think she fell to the ground during one of the episodes, she states that she is urinated on herself during these episodes and afterwards feels tired and confused.  She is not sure how long they last but states that her boyfriend watch one of the episodes and estimated it was about 5 minutes.  She denies any recent head trauma.  Again no prior history of seizures.  She has not been confused, no changes in speech, facial asymmetry, numbness tingling or weakness.  She denies any alcohol use but does openly endorse use of crack cocaine which she last used yesterday, denies any other drug use.        Past Medical History:  Diagnosis Date  . Acid reflux   . Asthma   . Bronchitis   . Chronic heel pain, left   . Diabetes mellitus   . Hypertension   . Substance abuse (HCC)    pt denies  . Suicidal thoughts    pt denies    Patient Active Problem List   Diagnosis Date Noted  . Puncture wound of left foot with complication 05/03/2018  . Calcaneus fracture, left 05/03/2018  . Cocaine abuse (HCC) 05/03/2018  . Cellulitis of left foot 05/02/2018  . Hypokalemia   . Colles' fracture of right radius 03/13/2012    Past Surgical History:  Procedure Laterality Date  . ABDOMINAL HYSTERECTOMY    . CESAREAN  SECTION     x 2     OB History    Gravida  5   Para  2   Term  2   Preterm      AB  3   Living  2     SAB  2   TAB  1   Ectopic      Multiple      Live Births              Family History  Problem Relation Age of Onset  . Diabetes Other   . Heart disease Other   . Arthritis Other   . Lung disease Other   . Cancer Other     Social History   Tobacco Use  . Smoking status: Current Every Day Smoker    Packs/day: 1.00    Years: 25.00    Pack years: 25.00    Types: Cigarettes  . Smokeless tobacco: Never Used  . Tobacco comment: 2-3 packs  Vaping Use  . Vaping Use: Never used  Substance Use Topics  . Alcohol use: Not Currently  . Drug use:  Yes    Types: Marijuana    Comment:  crack yesterday     Home Medications Prior to Admission medications   Medication Sig Start Date End Date Taking? Authorizing Provider  albuterol (PROVENTIL HFA;VENTOLIN HFA) 108 (90 BASE) MCG/ACT inhaler Inhale 2 puffs into the lungs every 6 (six) hours as needed for wheezing.    [provider]  amLODipine (NORVASC) 5 MG tablet Take 1 tablet (5 mg total) by mouth daily. 11/20/17   Fayrene Helper, PA-C  aspirin EC 81 MG tablet Take 81 mg by mouth daily.    [provider]  lisinopril (PRINIVIL,ZESTRIL) 5 MG tablet Take 1 tablet (5 mg total) by mouth daily. 11/20/17   Fayrene Helper, PA-C  methocarbamol (ROBAXIN) 500 MG tablet Take 1 tablet (500 mg total) by mouth every 8 (eight) hours as needed for muscle spasms. 01/04/20   Petrucelli, Samantha R, PA-C  naproxen (NAPROSYN) 500 MG tablet Take 1 tablet (500 mg total) by mouth 2 (two) times daily. 01/04/20   Petrucelli, Samantha R, PA-C  naproxen (NAPROSYN) 500 MG tablet Take 1 tablet (500 mg total) by mouth 2 (two) times daily. 06/01/20   Horton, Mayer Masker, MD    Allergies    Doxycycline and Strawberry extract  Review of Systems   Review of Systems  Constitutional: Positive for appetite change, chills and fatigue. Negative for fever.  HENT: Positive for congestion and rhinorrhea.   Eyes: Negative for visual disturbance.  Respiratory: Positive for cough and shortness of breath.   Cardiovascular: Negative for chest pain, palpitations and leg swelling.  Gastrointestinal: Positive for diarrhea, nausea and vomiting. Negative for abdominal pain and blood in stool.  Genitourinary: Negative for dysuria and frequency.  Musculoskeletal: Positive for myalgias. Negative for arthralgias, back pain and neck pain.  Skin: Negative for color change and rash.  Neurological: Positive for seizures and headaches. Negative for dizziness, tremors, syncope, facial asymmetry, speech difficulty, weakness, light-headedness  and numbness.    Physical Exam Updated Vital Signs BP (!) 177/90   Pulse 85   Temp 98.5 F (36.9 C) (Oral)   Resp 17   Ht 5\' 9"  (1.753 m)   Wt 106.1 kg   SpO2 99%   BMI 34.56 kg/m   Physical Exam Vitals and nursing note reviewed.  Constitutional:      General: She is not in acute  distress.    Appearance: Normal appearance. She is well-developed. She is ill-appearing. She is not diaphoretic.     Comments: Alert, somewhat ill-appearing but in no acute distress  HENT:     Head: Normocephalic and atraumatic.     Nose: Congestion and rhinorrhea present.     Mouth/Throat:     Mouth: Mucous membranes are moist.     Pharynx: Oropharynx is clear.  Eyes:     General:        Right eye: No discharge.        Left eye: No discharge.     Pupils: Pupils are equal, round, and reactive to light.  Neck:     Comments: No nuchal rigidity, full rom w/o pain Cardiovascular:     Rate and Rhythm: Normal rate and regular rhythm.     Heart sounds: Normal heart sounds. No murmur heard.  No friction rub. No gallop.   Pulmonary:     Effort: Pulmonary effort is normal. No respiratory distress.     Breath sounds: Normal breath sounds. No wheezing or rales.     Comments: Respirations equal and unlabored, patient able to speak in full sentences, lungs clear to auscultation bilaterally Chest:     Chest wall: No tenderness.  Abdominal:     General: Bowel sounds are normal. There is no distension.     Palpations: Abdomen is soft. There is no mass.     Tenderness: There is no abdominal tenderness. There is no guarding.     Comments: Abdomen soft, nondistended, nontender to palpation in all quadrants without guarding or peritoneal signs  Musculoskeletal:        General: No deformity.     Cervical back: Neck supple. No rigidity.     Right lower leg: No edema.     Left lower leg: No edema.  Skin:    General: Skin is warm and dry.     Capillary Refill: Capillary refill takes less than 2 seconds.      Findings: No rash.  Neurological:     Mental Status: She is alert and oriented to person, place, and time.     Coordination: Coordination normal.     Comments: Speech is clear, able to follow commands CN III-XII intact Normal strength in upper and lower extremities bilaterally including dorsiflexion and plantar flexion, strong and equal grip strength Sensation normal to light and sharp touch Moves extremities without ataxia, coordination intact No tremors or seizure like activity noted  Psychiatric:        Mood and Affect: Mood normal.        Behavior: Behavior normal.     ED Results / Procedures / Treatments   Labs (all labs ordered are listed, but only abnormal results are displayed) Labs Reviewed  CBC WITH DIFFERENTIAL/PLATELET - Abnormal; Notable for the following components:      Result Value   RBC 5.69 (*)    Hemoglobin 15.3 (*)    HCT 47.1 (*)    RDW 16.8 (*)    All other components within normal limits  URINALYSIS, ROUTINE W REFLEX MICROSCOPIC - Abnormal; Notable for the following components:   Color, Urine STRAW (*)    Hgb urine dipstick MODERATE (*)    All other components within normal limits  RAPID URINE DRUG SCREEN, HOSP PERFORMED - Abnormal; Notable for the following components:   Cocaine POSITIVE (*)    All other components within normal limits  RESPIRATORY PANEL BY RT PCR (FLU A&B, COVID)  COMPREHENSIVE METABOLIC PANEL    EKG EKG Interpretation  Date/Time:  Thursday August 28 2020 11:59:37 EDT Ventricular Rate:  85 PR Interval:    QRS Duration: 82 QT Interval:  395 QTC Calculation: 470 R Axis:   73 Text Interpretation: Sinus rhythm Confirmed by Vanetta Mulders [phone]) on 08/28/2020 12:13:05 PM   Radiology CT Head Wo Contrast  Result Date: 08/28/2020 CLINICAL DATA:  Seizure EXAM: CT HEAD WITHOUT CONTRAST TECHNIQUE: Contiguous axial images were obtained from the base of the skull through the vertex without intravenous contrast. COMPARISON:   01/04/2020 FINDINGS: Brain: There is no acute intracranial hemorrhage, mass effect, or edema. Gray-white differentiation is preserved. There is no extra-axial fluid collection. Ventricles and sulci are within normal limits in size and configuration. Vascular: No hyperdense vessel or unexpected calcification. Skull: Calvarium is unremarkable. Sinuses/Orbits: Lobular mucosal thickening. Other: None. IMPRESSION: No acute intracranial abnormality. Electronically Signed   By: Guadlupe Spanish M.D.   On: 08/28/2020 14:02   MR Brain W and Wo Contrast  Result Date: 08/28/2020 CLINICAL DATA:  Seizure, abnormal neuro exam. EXAM: MRI HEAD WITHOUT AND WITH CONTRAST TECHNIQUE: Multiplanar, multiecho pulse sequences of the brain and surrounding structures were obtained without and with intravenous contrast. CONTRAST:  11mL GADAVIST GADOBUTROL 1 MMOL/ML IV SOLN COMPARISON:  08/28/2020 head CT and prior. FINDINGS: Brain: No diffusion-weighted signal abnormality. No intracranial hemorrhage. No midline shift, ventriculomegaly or extra-axial fluid collection. No mass lesion. No abnormal enhancement. Cerebral volume is within normal limits. No significant white matter disease. Incidental partially CSF filled sella turcica. The bilateral hippocampal complexes are symmetric demonstrating normal signal intensity and morphology. Vascular: Proximally preserved major intracranial flow voids. Skull and upper cervical spine: Normal marrow signal. Sinuses/Orbits: Normal orbits. Mild to moderate pansinus disease. Trace right mastoid effusion. Other: Please note some image sequences are degraded by motion artifact. IMPRESSION: No acute intracranial process.  Normal appearance of the hippocampi. Mild to moderate pansinus disease. Electronically Signed   By: Stana Bunting M.D.   On: 08/28/2020 16:16   DG Chest Port 1 View  Result Date: 08/28/2020 CLINICAL DATA:  Cough, fever. EXAM: PORTABLE CHEST 1 VIEW COMPARISON:  September 18, 2019.  FINDINGS: The heart size and mediastinal contours are within normal limits. Both lungs are clear. The visualized skeletal structures are unremarkable. IMPRESSION: No active disease. Electronically Signed   By: Lupita Raider M.D.   On: 08/28/2020 12:12    Procedures Procedures (including critical care time)  Medications Ordered in ED Medications  levETIRAcetam (KEPPRA) IVPB 1000 mg/100 mL premix (0 mg Intravenous Stopped 08/28/20 1653)  sodium chloride 0.9 % bolus 500 mL (500 mLs Intravenous New Bag/Given 08/28/20 1626)  ketorolac (TORADOL) 30 MG/ML injection 15 mg (15 mg Intravenous Given 08/28/20 1630)  metoCLOPramide (REGLAN) injection 10 mg (10 mg Intravenous Given 08/28/20 1629)  diphenhydrAMINE (BENADRYL) injection 25 mg (25 mg Intravenous Given 08/28/20 1631)  gadobutrol (GADAVIST) 1 MMOL/ML injection 10 mL (10 mLs Intravenous Contrast Given 08/28/20 1529)    ED Course  I have reviewed the triage vital signs and the nursing notes.  Pertinent labs & imaging results that were available during my care of the patient were reviewed by me and considered in my medical decision making (see chart for details).    MDM Rules/Calculators/A&P                          49 year old female presents with cough, congestion, body aches and malaise for 3 days  but also reports that over the past week she has had 6 episodes of what she reports is seizure-like activity over the right side of her body where she cannot control movement, will urinate on herself and then feels tired and somewhat confused afterwards.  Patient has no prior history of seizures.  She is unsure how long these episodes last, last had an episode earlier this morning.  Does endorse crack cocaine use but denies other drug or alcohol use.  On arrival patient is hypertensive but vitals are otherwise normal, she is afebrile, and appears as though she feels poorly but is in no acute distress.  She is alert and oriented with no focal  neurologic deficits.  Lungs are clear and abdomen is nontender.  Will check Covid and flu swab as well as chest x-ray and will also evaluate for potential causes of seizure-like activity will check labs, UDS, and head CT.  Patient does not have any nuchal rigidity she is alert and oriented without confusion or focal neurologic deficits low suspicion for meningitis or infectious etiology of patient's neurologic symptoms.  Cocaine use could certainly be contributing.  I have independently ordered, reviewed and interpreted all labs and imaging: CBC: No leukocytosis, slightly elevated hemoglobin suggesting hemoconcentration CMP: No significant electrolyte derangements, normal renal and liver function. UA: No evidence of infection UDS: Positive for cocaine as expected, no other substances found Covid/flu: Negative  Chest x-ray: No evidence of pneumonia, no other active cardiopulmonary disease.  CT of the head: No acute intracranial abnormalities.  Patient has been observed here in the ED thus far with no further seizure-like activity, but given multiple possible seizure episodes reported in the week will discuss with neurology for further recommendations   Case discussed with Dr. Gerilyn Pilgrimoonquah with neurology who recommends getting MRI with and without contrast of the brain, if this is normal he does not think patient will require admission, but does recommend starting patient on Keppra with IV loading dose and then 500 twice daily.  He will see the patient outpatient for follow-up.  Does not recommend any further emergent work-up in the ED.  MRI with no noted acute intracranial abnormalities.  Patient given loading dose of Keppra and IV headache cocktail and reports complete resolution of her headache and that she is feeling much better.  She has been observed for several hours in the ED with no witnessed seizure-like activity.  At this time she is stable for discharge home I have prescribed her Keppra and  discussed seizure precautions, and discussed that she should not drive until she has had further evaluation with neurology.  Follow-up discussed as well as return precautions.  Discharged home in good condition.   Final Clinical Impression(s) / ED Diagnoses Final diagnoses:  Seizure-like activity (HCC)  Viral URI with cough    Rx / DC Orders ED Discharge Orders         Ordered    levETIRAcetam (KEPPRA) 500 MG tablet  2 times daily        08/28/20 1752           Dartha LodgeFord, Adisa Litt N, [place] JerseyPA-C 08/29/20 [id number]    Vanetta MuldersZackowski, Scott, MD 08/29/20 2212

## 2020-09-17 ENCOUNTER — Ambulatory Visit: Payer: Self-pay | Admitting: Physician Assistant

## 2020-09-18 ENCOUNTER — Encounter: Payer: Self-pay | Admitting: Student

## 2020-09-29 ENCOUNTER — Ambulatory Visit: Payer: Self-pay | Admitting: Physician Assistant

## 2020-09-29 ENCOUNTER — Telehealth: Payer: Self-pay

## 2020-09-29 ENCOUNTER — Encounter: Payer: Self-pay | Admitting: Physician Assistant

## 2020-09-29 VITALS — BP 136/90 | HR 81 | Temp 97.0°F | Ht 68.0 in | Wt 208.5 lb

## 2020-09-29 DIAGNOSIS — F1410 Cocaine abuse, uncomplicated: Secondary | ICD-10-CM

## 2020-09-29 DIAGNOSIS — J449 Chronic obstructive pulmonary disease, unspecified: Secondary | ICD-10-CM

## 2020-09-29 DIAGNOSIS — Z1239 Encounter for other screening for malignant neoplasm of breast: Secondary | ICD-10-CM

## 2020-09-29 DIAGNOSIS — Z13220 Encounter for screening for lipoid disorders: Secondary | ICD-10-CM

## 2020-09-29 DIAGNOSIS — Z131 Encounter for screening for diabetes mellitus: Secondary | ICD-10-CM

## 2020-09-29 DIAGNOSIS — R918 Other nonspecific abnormal finding of lung field: Secondary | ICD-10-CM

## 2020-09-29 DIAGNOSIS — F17200 Nicotine dependence, unspecified, uncomplicated: Secondary | ICD-10-CM

## 2020-09-29 DIAGNOSIS — Z7689 Persons encountering health services in other specified circumstances: Secondary | ICD-10-CM

## 2020-09-29 DIAGNOSIS — G40909 Epilepsy, unspecified, not intractable, without status epilepticus: Secondary | ICD-10-CM

## 2020-09-29 DIAGNOSIS — E669 Obesity, unspecified: Secondary | ICD-10-CM

## 2020-09-29 MED ORDER — ALBUTEROL SULFATE HFA 108 (90 BASE) MCG/ACT IN AERS: 2.0000 | INHALATION_SPRAY | Freq: Four times a day (QID) | RESPIRATORY_TRACT | 0 refills | Status: DC | PRN

## 2020-09-29 MED ORDER — LEVETIRACETAM 500 MG PO TABS: 500.0000 mg | ORAL_TABLET | Freq: Two times a day (BID) | ORAL | 0 refills | Status: DC

## 2020-09-29 MED ORDER — AMLODIPINE BESYLATE 5 MG PO TABS: 5.0000 mg | ORAL_TABLET | Freq: Every day | ORAL | 0 refills | Status: DC

## 2020-09-29 NOTE — Patient Instructions (Addendum)
[facility] -assist with CAFA/[employer] financial assistance application -transportation for covid vaccination -transportation to neurology appointment   Labs/bloodwork -tomorrow- fasting
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
## 2020-09-29 NOTE — Telephone Encounter (Signed)
Attempted call to follow up with client after her first appointment at [facility] in [place]. Also was calling after referral from provider that client would like transportation to get her 2nd Covid vaccine. Call did not go through, message stated due to restrictions in place.    Francee Nodal RN Case Manager [facility]

## 2020-10-06 ENCOUNTER — Other Ambulatory Visit: Payer: Self-pay

## 2020-10-06 ENCOUNTER — Other Ambulatory Visit: Payer: Self-pay | Admitting: Student

## 2020-10-06 ENCOUNTER — Other Ambulatory Visit (HOSPITAL_COMMUNITY)
Admission: RE | Admit: 2020-10-06 | Discharge: 2020-10-06 | Disposition: A | Discharge status: Home / Self Care | Payer: Self-pay | Source: Ambulatory Visit | Attending: Physician Assistant | Admitting: Physician Assistant

## 2020-10-06 DIAGNOSIS — Z1239 Encounter for other screening for malignant neoplasm of breast: Secondary | ICD-10-CM

## 2020-10-06 DIAGNOSIS — R69 Illness, unspecified: Secondary | ICD-10-CM | POA: Insufficient documentation

## 2020-10-06 LAB — COMPREHENSIVE METABOLIC PANEL
ALT: 16 U/L (ref 0–44)
AST: 15 U/L (ref 15–41)
Albumin: 3.6 g/dL (ref 3.5–5.0)
Alkaline Phosphatase: 122 U/L (ref 38–126)
Anion gap: 7 (ref 5–15)
BUN: 14 mg/dL (ref 6–20)
CO2: 28 mmol/L (ref 22–32)
Calcium: 9 mg/dL (ref 8.9–10.3)
Chloride: 105 mmol/L (ref 98–111)
Creatinine, Ser: 1.17 mg/dL — ABNORMAL HIGH (ref 0.44–1.00)
GFR, Estimated: 57 mL/min — ABNORMAL LOW (ref >60)
Glucose, Bld: 101 mg/dL — ABNORMAL HIGH (ref 70–99)
Potassium: 3.8 mmol/L (ref 3.5–5.1)
Sodium: 140 mmol/L (ref 135–145)
Total Bilirubin: 0.3 mg/dL (ref 0.3–1.2)
Total Protein: 7.4 g/dL (ref 6.5–8.1)

## 2020-10-06 LAB — LIPID PANEL
Cholesterol: 145 mg/dL (ref 0–200)
HDL: 40 mg/dL — ABNORMAL LOW (ref >40)
LDL Cholesterol: 82 mg/dL (ref 0–99)
Total CHOL/HDL Ratio: 3.6 RATIO
Triglycerides: 115 mg/dL (ref <150)
VLDL: 23 mg/dL (ref 0–40)

## 2020-10-06 LAB — HEMOGLOBIN A1C
Hgb A1c MFr Bld: 6.1 % — ABNORMAL HIGH (ref 4.8–5.6)
Mean Plasma Glucose: 128.37 mg/dL

## 2020-10-13 NOTE — Congregational Nurse Program (Unsigned)
Client to Katrina Good by phone, has been followed by [facility] [employer].  Client is unable to afford her Keppra for her seizures. It is not available through [facility] and her new primary care is referring her to Neurologist for evaluation and management of seizures.  Will provide a one time assistance from [employer] to [employer]. Client states her mother can take her to pick it up. Voucher sent by fax to [employer]. [facility] only prescribed one month supply. Discussed that once client is seen by neurologist will seek other alternatives for medication dependant on specialist orders. Client agreeable.  Will continue to follow for any further needs.  Francee Nodal RN [facility]

## 2020-10-13 NOTE — Congregational Nurse Program (Signed)
Client came by [facility] today to bring paperwork back needed for CAFA.  Was able to follow up with client to assure that she was able to get her Keppra on 10/06/20 from [facility] with help of [employer].   Client states she did get her medication.  Discussed that she has a neurologist appointment in Jan that she will need transportation for. Will plan to arrange transportation through [facility].  Will leave paperwork for D. Leavy Cella of [facility] for [facility] financial assistance applications(CAFA).  Will follow client as needed.  Francee Nodal RN [facility]

## 2020-10-13 NOTE — Congregational Nurse Program (Signed)
Pt presented to [facility] to be connected to the Care Connect program for uninsured and to complete enrollment.   In addition to this service, she also requested assistance with helping getting an appointment scheduled for her 2nd dose pfizer vaccine.    An appointment was scheduled for patient using the online  vaccine scheduler for the date of Katrina Good) Oct 16, 2020@ 2:15pm

## 2020-10-13 NOTE — Congregational Nurse Program (Signed)
Pt came by office to complete CAFA application with [facility], [employer]ay [employer]. Pt asked about getting help with obtaining her 2nd Dose [employer].   Pt was then referred to clinic nurse, W.Yomara Toothman, to provide assistance with vaccine appt request.  Plan Pt was scheduled an appt at the [facility] for [employer]. Dec. 16 @ 2:15pm  by way of using the [facility] Health Vaccine- Online Scheduling Center.    Appt info was  printed out and provided to the pt for her records  Pt understood understood appt information and left clinic

## 2020-10-16 ENCOUNTER — Ambulatory Visit: Payer: Self-pay

## 2020-10-21 ENCOUNTER — Telehealth: Payer: Self-pay

## 2020-10-21 NOTE — Telephone Encounter (Signed)
client called concerned about transportation for 11/05/19 to neuro. explained to client that if called too far ahead that increases chance of getting overlooked. normally call when possible no more than 1 week and otherwise at least 24 to 48 hours or more when possible, if appointment is the same week that RN is notified of need. I explained that I will call for [facility]  transportation next week and let her know it was arranged and then [facility] transportation gives her a call to confirm when they will pick her up. Client reports understanding.  Francee Nodal RN [facility] [employer]

## 2020-10-23 ENCOUNTER — Ambulatory Visit: Payer: Self-pay | Attending: Internal Medicine

## 2020-10-23 DIAGNOSIS — Z23 Encounter for immunization: Secondary | ICD-10-CM

## 2020-10-23 NOTE — Progress Notes (Signed)
   Covid-19 Vaccination Clinic  Name:  Katrina Good    MRN: [id number] DOB: 12-12-1970  10/23/2020  [name] was observed post Covid-19 immunization for 15 minutes without incident. She was provided with Vaccine Information Sheet and instruction to access the V-Safe system.   [name] was instructed to call 911 with any severe reactions post vaccine: Marland Kitchen Difficulty breathing  . Swelling of face and throat  . A fast heartbeat  . A bad rash all over body  . Dizziness and weakness   Immunizations Administered    Name Date Dose VIS Date Route   Pfizer COVID-19 Vaccine 10/23/2020  3:10 PM 0.3 mL 08/20/2020 Intramuscular   Manufacturer: [employer]   Lot: [id number]   NDC: [address]-2

## 2020-10-27 ENCOUNTER — Ambulatory Visit (HOSPITAL_COMMUNITY): Payer: Self-pay

## 2020-11-04 ENCOUNTER — Encounter: Payer: Self-pay | Admitting: Diagnostic Neuroimaging

## 2020-11-04 ENCOUNTER — Ambulatory Visit: Payer: Self-pay | Admitting: Diagnostic Neuroimaging

## 2020-11-06 ENCOUNTER — Other Ambulatory Visit: Payer: Self-pay | Admitting: Physician Assistant

## 2020-11-06 DIAGNOSIS — N289 Disorder of kidney and ureter, unspecified: Secondary | ICD-10-CM
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
## 2020-11-13 ENCOUNTER — Other Ambulatory Visit: Payer: Self-pay | Admitting: Physician Assistant

## 2020-11-13 MED ORDER — LEVETIRACETAM 500 MG PO TABS
500.0000 mg | ORAL_TABLET | Freq: Two times a day (BID) | ORAL | 1 refills | Status: DC
Start: 2020-11-13 — End: 2021-01-14

## 2020-11-14 ENCOUNTER — Telehealth: Payer: Self-pay

## 2020-11-14 NOTE — Telephone Encounter (Signed)
Called client to follow up regarding her Keppra prescription. Confirmed that [facility] to [facility].  [facility] can offer a 10$ [employer] Gift card to assist with affordability of prescription. Client states she can pick up gift card from [facility] office 11/14/20.  Discussed with client the need to keep her appointment in March with Neurologist in order for them to manager her seizures and her medication for seizures. Client reports understanding.  Will continue to follow for support and CM as needed.  Francee Nodal RN [facility]

## 2020-11-19 ENCOUNTER — Ambulatory Visit (HOSPITAL_COMMUNITY)
Admission: RE | Admit: 2020-11-19 | Discharge: 2020-11-19 | Disposition: A | Discharge status: Home / Self Care | Payer: Self-pay | Source: Ambulatory Visit | Attending: Physician Assistant | Admitting: Physician Assistant

## 2020-11-19 ENCOUNTER — Other Ambulatory Visit: Payer: Self-pay

## 2020-11-19 DIAGNOSIS — Z1239 Encounter for other screening for malignant neoplasm of breast: Secondary | ICD-10-CM | POA: Insufficient documentation

## 2020-11-20 ENCOUNTER — Telehealth: Payer: Self-pay

## 2020-11-20 NOTE — Telephone Encounter (Signed)
Pt contacted our program (Care Connect/Clara Gunn) to update  regarding her completing the application related to the [employer] phone program that our onsite Social Work intern helped connect her with.    Pt was asked if she was able to get her medications (keppra) ok with the gift card provided during her last visit with [place], she stated everything went well with her picking up at the pharmacist.  Pt also was asked of any other needs to be addressed while on the call and she stated none.  Pt also advised of awareness of covid testing kits initiative and if she completed the enrollment form.   She stated she would be interested in receiving the link to complete form on own.  Plan  - Sent Link for Covid kits initiative sign up form by text for patient to complete on own, per her requests  Pt understood and phone call ended

## 2020-11-21 ENCOUNTER — Telehealth: Payer: Self-pay

## 2020-11-21 NOTE — Telephone Encounter (Signed)
Attempted to return call to client from message left today. Attempted both numbers listed and was unable to connect x 2 Plan to attempt to call later today again.  Francee Nodal RN [facility] [employer]

## 2020-11-24 ENCOUNTER — Other Ambulatory Visit: Payer: Self-pay

## 2020-11-24 ENCOUNTER — Other Ambulatory Visit (HOSPITAL_COMMUNITY)
Admission: RE | Admit: 2020-11-24 | Discharge: 2020-11-24 | Disposition: A | Discharge status: Home / Self Care | Payer: Self-pay | Source: Ambulatory Visit | Attending: Physician Assistant | Admitting: Physician Assistant

## 2020-11-24 ENCOUNTER — Encounter: Payer: Self-pay | Admitting: Physician Assistant

## 2020-11-24 ENCOUNTER — Ambulatory Visit: Payer: Self-pay | Admitting: Physician Assistant

## 2020-11-24 VITALS — BP 126/84 | HR 83 | Temp 96.2°F | Ht 68.0 in | Wt 203.0 lb

## 2020-11-24 DIAGNOSIS — G40909 Epilepsy, unspecified, not intractable, without status epilepticus: Secondary | ICD-10-CM

## 2020-11-24 DIAGNOSIS — N289 Disorder of kidney and ureter, unspecified: Secondary | ICD-10-CM

## 2020-11-24 DIAGNOSIS — F17200 Nicotine dependence, unspecified, uncomplicated: Secondary | ICD-10-CM

## 2020-11-24 DIAGNOSIS — R918 Other nonspecific abnormal finding of lung field: Secondary | ICD-10-CM

## 2020-11-24 DIAGNOSIS — F1410 Cocaine abuse, uncomplicated: Secondary | ICD-10-CM

## 2020-11-24 DIAGNOSIS — E669 Obesity, unspecified: Secondary | ICD-10-CM

## 2020-11-24 DIAGNOSIS — R351 Nocturia: Secondary | ICD-10-CM

## 2020-11-24 DIAGNOSIS — J449 Chronic obstructive pulmonary disease, unspecified: Secondary | ICD-10-CM

## 2020-11-24 DIAGNOSIS — I10 Essential (primary) hypertension: Secondary | ICD-10-CM

## 2020-11-24 DIAGNOSIS — Z13220 Encounter for screening for lipoid disorders: Secondary | ICD-10-CM | POA: Insufficient documentation

## 2020-11-24 DIAGNOSIS — Z131 Encounter for screening for diabetes mellitus: Secondary | ICD-10-CM | POA: Insufficient documentation

## 2020-11-24 LAB — COMPREHENSIVE METABOLIC PANEL
ALT: 19 U/L (ref 0–44)
AST: 16 U/L (ref 15–41)
Albumin: 3.7 g/dL (ref 3.5–5.0)
Alkaline Phosphatase: 118 U/L (ref 38–126)
Anion gap: 9 (ref 5–15)
BUN: 19 mg/dL (ref 6–20)
CO2: 28 mmol/L (ref 22–32)
Calcium: 9.4 mg/dL (ref 8.9–10.3)
Chloride: 103 mmol/L (ref 98–111)
Creatinine, Ser: 1.41 mg/dL — ABNORMAL HIGH (ref 0.44–1.00)
GFR, Estimated: 46 mL/min — ABNORMAL LOW (ref >60)
Glucose, Bld: 99 mg/dL (ref 70–99)
Potassium: 3.8 mmol/L (ref 3.5–5.1)
Sodium: 140 mmol/L (ref 135–145)
Total Bilirubin: 0.5 mg/dL (ref 0.3–1.2)
Total Protein: 7.9 g/dL (ref 6.5–8.1)

## 2020-11-24 LAB — LIPID PANEL
Cholesterol: 164 mg/dL (ref 0–200)
HDL: 48 mg/dL (ref >40)
LDL Cholesterol: 86 mg/dL (ref 0–99)
Total CHOL/HDL Ratio: 3.4 RATIO
Triglycerides: 152 mg/dL — ABNORMAL HIGH (ref <150)
VLDL: 30 mg/dL (ref 0–40)

## 2020-11-24 LAB — POCT URINALYSIS DIPSTICK
Bilirubin, UA: NEGATIVE
Glucose, UA: NEGATIVE
Ketones, UA: NEGATIVE
Leukocytes, UA: NEGATIVE
Nitrite, UA: NEGATIVE
Protein, UA: NEGATIVE
Spec Grav, UA: >=1.03 — AB (ref 1.010–1.025)
Urobilinogen, UA: 0.2 E.U./dL
pH, UA: 5.5 (ref 5.0–8.0)

## 2020-11-24 LAB — HEMOGLOBIN A1C
Hgb A1c MFr Bld: 5.9 % — ABNORMAL HIGH (ref 4.8–5.6)
Mean Plasma Glucose: 122.63 mg/dL

## 2020-11-24 NOTE — Progress Notes (Signed)
BP 126/84   Pulse 83   Temp (!) 96.2 F (35.7 C)   Ht 5\' 8"  (1.727 m)   Wt 203 lb (92.1 kg)   SpO2 99%   BMI 30.87 kg/m    Subjective:    Patient ID: , female    DOB: 25-Feb-1971, 50 y.o.   MRN: [id number]  HPI: Katrina Good is a 50 y.o. female presenting on 11/24/2020 for Follow-up   HPI   Pt had a negative covid 19 screening questionnaire.   Pt is 49yoF who presents for routine follow up She didn't get labs drawn She says she is feling better She was a no-show to her neurology appointment on 1/4.  She has appt 3/16.  She is scheduled to be seen for seizures.  She is still using drugs.   She says she been talking with her case manager about some classes.    She C/o LBP.    xrays 3/21- unremarkale except for atherosclerotic changes.    She got her 2nd covid vaccination.  She states nocturia.  No pain or dysuria  Pt Asks questions about seizures.  She says she Doesn't understand about them    Relevant past medical, surgical, family and social history reviewed and updated as indicated. Interim medical history since our last visit reviewed. Allergies and medications reviewed and updated.   Current Outpatient Medications:  .  acetaminophen (TYLENOL) 325 MG tablet, Take 325 mg by mouth 2 (two) times daily as needed., Disp: , Rfl:  .  albuterol (VENTOLIN HFA) 108 (90 Base) MCG/ACT inhaler, Inhale 2 puffs into the lungs every 6 (six) hours as needed for wheezing or shortness of breath., Disp: 3 each, Rfl: 0 .  amLODipine (NORVASC) 5 MG tablet, Take 1 tablet (5 mg total) by mouth daily., Disp: 90 tablet, Rfl: 0 .  diphenhydrAMINE HCl (BENADRYL ALLERGY PO), Take 1 tablet by mouth in the morning and at bedtime., Disp: , Rfl:  .  levETIRAcetam (KEPPRA) 500 MG tablet, Take 1 tablet (500 mg total) by mouth 2 (two) times daily., Disp: 60 tablet, Rfl: 1 .  aspirin EC 81 MG tablet, Take 243 mg by mouth daily. Take 3 tabs daily (243 mg) (Patient not taking: Reported  on 11/24/2020), Disp: , Rfl:      Review of Systems  Per HPI unless specifically indicated above     Objective:    BP 126/84   Pulse 83   Temp (!) 96.2 F (35.7 C)   Ht 5\' 8"  (1.727 m)   Wt 203 lb (92.1 kg)   SpO2 99%   BMI 30.87 kg/m   Wt Readings from Last 3 Encounters:  11/24/20 203 lb (92.1 kg)  09/29/20 208 lb 8 oz (94.6 kg)  08/28/20 234 lb (106.1 kg)    Physical Exam Vitals reviewed.  Constitutional:      General: She is not in acute distress.    Appearance: She is well-developed and well-nourished. She is not toxic-appearing.  HENT:     Head: Normocephalic and atraumatic.  Cardiovascular:     Rate and Rhythm: Normal rate and regular rhythm.  Pulmonary:     Effort: Pulmonary effort is normal.     Breath sounds: Normal breath sounds.  Abdominal:     General: Bowel sounds are normal.     Palpations: Abdomen is soft. There is no hepatosplenomegaly or mass.     Tenderness: There is no abdominal tenderness.  Musculoskeletal:  General: No edema.     Cervical back: Neck supple.     Right lower leg: No edema.     Left lower leg: No edema.  Lymphadenopathy:     Cervical: No cervical adenopathy.  Skin:    General: Skin is warm and dry.  Neurological:     Mental Status: She is alert and oriented to person, place, and time.  Psychiatric:        Mood and Affect: Mood and affect normal.        Behavior: Behavior normal.    Urinalysis    Component Value Date/Time   COLORURINE STRAW (A) 08/28/2020 1317   APPEARANCEUR CLEAR 08/28/2020 1317   LABSPEC 1.005 08/28/2020 1317   PHURINE 7.0 08/28/2020 1317   GLUCOSEU NEGATIVE 08/28/2020 1317   HGBUR MODERATE (A) 08/28/2020 1317   BILIRUBINUR NEG 11/24/2020 1003   KETONESUR NEGATIVE 08/28/2020 1317   PROTEINUR Negative 11/24/2020 1003   PROTEINUR NEGATIVE 08/28/2020 1317   UROBILINOGEN 0.2 11/24/2020 1003   UROBILINOGEN 0.2 07/24/2015 1234   NITRITE NEG 11/24/2020 1003   NITRITE NEGATIVE 08/28/2020 1317    LEUKOCYTESUR Negative 11/24/2020 1003   LEUKOCYTESUR NEGATIVE 08/28/2020 1317            Assessment & Plan:    Encounter Diagnoses  Name Primary?  . Impaired renal function Yes  . Seizure disorder (HCC)   . Nocturia   . Cocaine abuse (HCC)   . Pulmonary nodules   . Obesity, unspecified classification, unspecified obesity type, unspecified whether serious comorbidity present   . Tobacco use disorder   . Chronic obstructive pulmonary disease, unspecified COPD type (HCC)   . Primary hypertension        -will place order for Chest ct which is due for  1 year f/u (due after 3/5) for follow up pulmonary nodules.  She Has cafa -she has appointment for  screening mammogram in february -Bmp need to be drawn.  She will call results -pt is given Reading information on seizures to help her knowledge.  She is encouraged to make it to her scheduled neurology appointment -pt is given Back exercises -Reminded pt to avoid drugs to reduce seizure risks -pt to follow upF/u 3 months.  She is to contact office sooner prn

## 2020-11-24 NOTE — Patient Instructions (Signed)
Seizure, Adult A seizure is a sudden burst of abnormal electrical and chemical activity in the brain. Seizures usually last from 30 seconds to 2 minutes. The abnormal activity temporarily interrupts normal brain function. Many types of seizures can affect adults. A seizure can cause many different symptoms depending on where in the brain it starts. What are the causes? Common causes of this condition include:  Fever or infection.  Brain injury, head trauma, bleeding in the brain, or a brain tumor.  Low levels of blood sugar or salt (sodium).  Kidney problems or liver problems.  Metabolic disorders or other conditions that are passed from parent to child (are inherited).  Reaction to a substance, such as a drug or a medicine, or suddenly stopping the use of a substance (withdrawal).  A stroke.  Developmental disorders such as autism spectrum disorder or cerebral palsy. In some cases, the cause of a seizure may not be known. Some people who have a seizure never have another one. A person who has repeated seizures over time without a clear cause has a condition called epilepsy. What increases the risk? You are more likely to develop this condition if:  You have a family history of epilepsy.  You have had a tonic-clonic seizure before. This type of seizure causes tightening (contraction) of the muscles of the whole body and loss of consciousness.  You have a history of head trauma, lack of oxygen at birth, or strokes. What are the signs or symptoms? There are many different types of seizures. The symptoms vary depending on the type of seizure you have. Symptoms occur during the seizure. They may also occur before a seizure (aura) and after a seizure (postictal). Symptoms may include the following: Symptoms during a seizure  Uncontrollable shaking (convulsions) with fast, jerky movements of muscles.  Stiffening of the body.  Breathing problems.  Confusion, staring, or  unresponsiveness.  Head nodding, eye blinking or fluttering, or rapid eye movements.  Drooling, grunting, or making clicking sounds with your mouth.  Loss of bladder control and bowel control. Symptoms before a seizure  Fear or anxiety.  Nausea.  Vertigo. This is a feeling like: ? You are moving when you are not. ? Your surroundings are moving when they are not.  Dj vu. This is a feeling of having seen or heard something before.  Odd tastes or smells.  Changes in vision, such as seeing flashing lights or spots. Symptoms after a seizure  Confusion.  Sleepiness.  Headache.  Sore muscles. How is this diagnosed? This condition may be diagnosed based on:  A description of your symptoms. Video of your seizures can be helpful.  Your medical history.  A physical exam. You may also have tests, including:  Blood tests.  CT scan.  MRI.  Electroencephalogram (EEG). This test measures electrical activity in the brain. An EEG can predict whether seizures will return.  A spinal tap, also called a lumbar puncture. This is the removal and testing of fluid that surrounds the brain and spinal cord. How is this treated? Most seizures will stop on their own in less than 5 minutes, and no treatment is needed. Seizures that last longer than 5 minutes will usually need treatment. Seizures may be treated with:  Medicines given through an IV.  Avoiding known triggers, such as medicines that you take for another condition.  Medicines to control seizures or prevent future seizures (antiepileptics), if epilepsy caused your seizures.  Medical devices to prevent and control seizures.  Surgery   to stop seizures or to reduce how often seizures happen, if you have epilepsy that does not respond to medicines.  A diet low in carbohydrates and high in fat (ketogenic diet). Follow these instructions at home: Medicines  Take over-the-counter and prescription medicines only as told by  your health care provider.  Avoid any substances that may prevent your medicine from working properly, such as alcohol. Activity  Follow instructions about activities, such as driving or swimming, that would be dangerous if you had another seizure. Wait until your health care provider says it is safe to do them.  If you live in the [place] check with your local department of motor vehicles [facility]) to find out about local driving laws. Each state has specific rules about when you can legally drive again.  Get enough rest. Lack of sleep can make seizures more likely to occur. Educating others  Teach friends and family what to do if you have a seizure. They should: ? Help you get down to the ground, to prevent a fall. ? Cushion your head and move items away from your body. ? Loosen any tight clothing around your neck. ? Turn you on your side. If you vomit, this helps keep your airway clear. ? Know whether or not you need emergency care. ? Stay with you until you recover.  Also, tell them what not to do if you have a seizure. Tell them: ? They should not hold you down. Holding you down will not stop the seizure. ? They should not put anything in your mouth.   General instructions  Avoid anything that has ever triggered a seizure for you.  Keep a seizure diary. Record what you remember about each seizure, especially anything that might have triggered it.  Keep all follow-up visits. This is important. Contact a health care provider if:  You have another seizure or seizures. Call each time you have a seizure.  Your seizure pattern changes.  You continue to have seizures with treatment.  You have symptoms of an infection or illness. Either of these might increase your risk of having a seizure.  You are unable to take your medicine. Get help right away if:  You have: ? A seizure that does not stop after 5 minutes. ? Several seizures in a row without a complete recovery between  seizures. ? A seizure that makes it harder to breathe. ? A seizure that leaves you unable to speak or use a part of your body.  You do not wake up right away after a seizure.  You injure yourself during a seizure.  You have confusion or pain right after a seizure. These symptoms may represent a serious problem that is an emergency. Do not wait to see if the symptoms will go away. Get medical help right away. Call your local emergency services (911 in the [place] Do not drive yourself to the hospital. Summary  Seizures are caused by abnormal electrical and chemical activity in the brain. The activity disrupts normal brain function and can cause various symptoms.  Seizures have many causes, including illness, head injuries, low levels of blood sugar or salt, and certain conditions.  Most seizures will stop on their own in less than 5 minutes. Seizures that last longer than 5 minutes are a medical emergency and need treatment right away.  Many medicines are used to treat seizures. Take over-the-counter and prescription medicines only as told by your health care provider. This information is not intended to replace advice  given to you by your health care provider. Make sure you discuss any questions you have with your health care provider. Document Revised: 04/25/2020 Document Reviewed: 04/25/2020 Elsevier Patient Education  2021 Elsevier Inc.  -------------------------------------------------------------   Back Exercises The following exercises strengthen the muscles that help to support the trunk and back. They also help to keep the lower back flexible. Doing these exercises can help to prevent back pain or lessen existing pain.  If you have back pain or discomfort, try doing these exercises 2-3 times each day or as told by your health care provider.  As your pain improves, do them once each day, but increase the number of times that you repeat the steps for each exercise (do more  repetitions).  To prevent the recurrence of back pain, continue to do these exercises once each day or as told by your health care provider. Do exercises exactly as told by your health care provider and adjust them as directed. It is normal to feel mild stretching, pulling, tightness, or discomfort as you do these exercises, but you should stop right away if you feel sudden pain or your pain gets worse. Exercises Single knee to chest Repeat these steps 3-5 times for each leg: 1. Lie on your back on a firm bed or the floor with your legs extended. 2. Bring one knee to your chest. Your other leg should stay extended and in contact with the floor. 3. Hold your knee in place by grabbing your knee or thigh with both hands and hold. 4. Pull on your knee until you feel a gentle stretch in your lower back or buttocks. 5. Hold the stretch for 10-30 seconds. 6. Slowly release and straighten your leg. Pelvic tilt Repeat these steps 5-10 times: 1. Lie on your back on a firm bed or the floor with your legs extended. 2. Bend your knees so they are pointing toward the ceiling and your feet are flat on the floor. 3. Tighten your lower abdominal muscles to press your lower back against the floor. This motion will tilt your pelvis so your tailbone points up toward the ceiling instead of pointing to your feet or the floor. 4. With gentle tension and even breathing, hold this position for 5-10 seconds. Cat-cow Repeat these steps until your lower back becomes more flexible: 1. Get into a hands-and-knees position on a firm surface. Keep your hands under your shoulders, and keep your knees under your hips. You may place padding under your knees for comfort. 2. Let your head hang down toward your chest. Contract your abdominal muscles and point your tailbone toward the floor so your lower back becomes rounded like the back of a cat. 3. Hold this position for 5 seconds. 4. Slowly lift your head, let your abdominal  muscles relax and point your tailbone up toward the ceiling so your back forms a sagging arch like the back of a cow. 5. Hold this position for 5 seconds.   Press-ups Repeat these steps 5-10 times: 1. Lie on your abdomen (face-down) on the floor. 2. Place your palms near your head, about shoulder-width apart. 3. Keeping your back as relaxed as possible and keeping your hips on the floor, slowly straighten your arms to raise the top half of your body and lift your shoulders. Do not use your back muscles to raise your upper torso. You may adjust the placement of your hands to make yourself more comfortable. 4. Hold this position for 5 seconds while you keep  your back relaxed. 5. Slowly return to lying flat on the floor.   Bridges Repeat these steps 10 times: 1. Lie on your back on a firm surface. 2. Bend your knees so they are pointing toward the ceiling and your feet are flat on the floor. Your arms should be flat at your sides, next to your body. 3. Tighten your buttocks muscles and lift your buttocks off the floor until your waist is at almost the same height as your knees. You should feel the muscles working in your buttocks and the back of your thighs. If you do not feel these muscles, slide your feet 1-2 inches farther away from your buttocks. 4. Hold this position for 3-5 seconds. 5. Slowly lower your hips to the starting position, and allow your buttocks muscles to relax completely. If this exercise is too easy, try doing it with your arms crossed over your chest.   Abdominal crunches Repeat these steps 5-10 times: 1. Lie on your back on a firm bed or the floor with your legs extended. 2. Bend your knees so they are pointing toward the ceiling and your feet are flat on the floor. 3. Cross your arms over your chest. 4. Tip your chin slightly toward your chest without bending your neck. 5. Tighten your abdominal muscles and slowly raise your trunk (torso) high enough to lift your shoulder  blades a tiny bit off the floor. Avoid raising your torso higher than that because it can put too much stress on your low back and does not help to strengthen your abdominal muscles. 6. Slowly return to your starting position. Back lifts Repeat these steps 5-10 times: 1. Lie on your abdomen (face-down) with your arms at your sides, and rest your forehead on the floor. 2. Tighten the muscles in your legs and your buttocks. 3. Slowly lift your chest off the floor while you keep your hips pressed to the floor. Keep the back of your head in line with the curve in your back. Your eyes should be looking at the floor. 4. Hold this position for 3-5 seconds. 5. Slowly return to your starting position. Contact a health care provider if:  Your back pain or discomfort gets much worse when you do an exercise.  Your worsening back pain or discomfort does not lessen within 2 hours after you exercise. If you have any of these problems, stop doing these exercises right away. Do not do them again unless your health care provider says that you can. Get help right away if:  You develop sudden, severe back pain. If this happens, stop doing the exercises right away. Do not do them again unless your health care provider says that you can. This information is not intended to replace advice given to you by your health care provider. Make sure you discuss any questions you have with your health care provider. Document Revised: 02/22/2019 Document Reviewed: 07/20/2018 Elsevier Patient Education  2021 [employer].

## 2020-12-17 ENCOUNTER — Telehealth: Payer: Self-pay

## 2020-12-17 ENCOUNTER — Other Ambulatory Visit: Payer: Self-pay | Admitting: Physician Assistant

## 2020-12-17 ENCOUNTER — Telehealth: Payer: Self-pay | Admitting: Physician Assistant

## 2020-12-17 NOTE — Telephone Encounter (Signed)
Called client to follow up if [facility]. She states she did and "she told me to go somewhere". Female voice in the background stated " she told you to go the emergency room" client now states she is having chest pains also. Confirmed above with providers note in Epic. Client confirms she will go to the emergency room when she gets dressed. Emphasized she needs to go now as directed by [facility] provider. Client reports she will go to the ER.  Plan follow up later and client is agreeable.  Note: Transportation for 12/19/20 Mammogram scheduled with [employer], client aware.  Called [facility] for client and per [facility] she has no refills on her albuterol inhaler nor Amlodipine. [facility] states they will contact [facility] provider to request refills.   Francee Nodal RN [facility] [employer]

## 2020-12-17 NOTE — Telephone Encounter (Signed)
Pt called saying she is having chest pains and kidney pains.   Pt has HTN, substance abuse issues (cocaine) and prediabetes.  She is currently on keppra and has upcoming appt with neurology for evaluation of seizure like activity.   In light of pt complaints of CP with her history, she is encouraged to go directly to ER now for evaluation.  She agrees and says she will go to the ER now for her CP.

## 2020-12-17 NOTE — Telephone Encounter (Addendum)
Client called wanting assistance with arranging transportation for mammogram on 12/19/20 at 10am. Client sounds congested. She states she has had a cough and shortness of breath.  Asked client if she had contacted her primary care provider for guidance and she states she has not. She also states she has not received her amlodipine or albuterol inhaler from [facility]. Inquired if she had called [facility] to refill and she has not. Discussed how to call in medications.  Expressed to client that she needs to hang up now and call [facility] to discuss her illness and now she is stating she is having worsening "kidney pain" appointment with [facility] scheduled for 01/08/21.  Instructed client to call her medical provider now and discuss her cough and shortness of breath as well as her increased "kidney" pain for medical guidance, discussed that they are her medical provider and she should contact them for medical concerns. She states she has the number and will call.  Plan: follow up call this morning to determine if [facility] has [facility]   Francee Nodal RN [employer]

## 2020-12-19 ENCOUNTER — Other Ambulatory Visit: Payer: Self-pay

## 2020-12-19 ENCOUNTER — Ambulatory Visit (HOSPITAL_COMMUNITY)
Admission: RE | Admit: 2020-12-19 | Discharge: 2020-12-19 | Disposition: A | Discharge status: Home / Self Care | Payer: Self-pay | Source: Ambulatory Visit | Attending: Physician Assistant | Admitting: Physician Assistant

## 2020-12-19 DIAGNOSIS — Z1239 Encounter for other screening for malignant neoplasm of breast: Secondary | ICD-10-CM | POA: Insufficient documentation

## 2020-12-23 ENCOUNTER — Telehealth: Payer: Self-pay

## 2020-12-23 NOTE — Telephone Encounter (Signed)
Returning call to client who called and stated she was out of her blood pressure medicine and inhalers. Per Epic provider at [facility] sent in refills for both to [facility] on 12/17/20 Called [facility] and confirmed medication was mailed out of 12/19/20 and per tracking it should arrive today.  Client notified of above. Client states she did not go to ER on 12/17/20 as advised by her provider and this RN due to complaints of chest pains and increased kidney pain. Client reports she decided to wait that "I know I'll have some pains". [facility]. Encouraged client to call [facility] if she has medical needs prior to her follow up for guidance or office visit. She reports understanding.  Client also mentioned her seizure medication at [facility] which provider prescribed enough medication until she is seen by Neurology on 01/14/21. Reminded client that she needs to keep that appointment for them to evaluate and treat her seizures. She reports understanding.   Will follow as needed.  Francee Nodal RN [facility]

## 2021-01-05 ENCOUNTER — Other Ambulatory Visit: Payer: Self-pay

## 2021-01-05 ENCOUNTER — Ambulatory Visit (HOSPITAL_COMMUNITY)
Admission: RE | Admit: 2021-01-05 | Discharge: 2021-01-05 | Disposition: A | Discharge status: Home / Self Care | Payer: Self-pay | Source: Ambulatory Visit | Attending: Physician Assistant | Admitting: Physician Assistant

## 2021-01-05 DIAGNOSIS — R918 Other nonspecific abnormal finding of lung field: Secondary | ICD-10-CM

## 2021-01-08 ENCOUNTER — Telehealth: Payer: Self-pay

## 2021-01-08 NOTE — Telephone Encounter (Signed)
Called to let pt know that CT chest shows multiple nodules in her lungs that have not changed since CT done in March 2021. Recommended repeat CT chest in 1 year. Strongly recommend stopping smoking. CT also shows some coronary artery calcifications.

## 2021-01-12 ENCOUNTER — Telehealth: Payer: Self-pay

## 2021-01-12 NOTE — Telephone Encounter (Signed)
Called to let pt know that CT chest shows multiple nodules in her lungs that have not changed since CT done in March 2021. Recommended repeat CT chest in 1 year. Strongly recommend stopping smoking. CT also shows some coronary artery calcifications.   

## 2021-01-14 ENCOUNTER — Encounter: Payer: Self-pay | Admitting: Diagnostic Neuroimaging

## 2021-01-14 ENCOUNTER — Ambulatory Visit (INDEPENDENT_AMBULATORY_CARE_PROVIDER_SITE_OTHER): Payer: Self-pay | Admitting: Diagnostic Neuroimaging

## 2021-01-14 VITALS — BP 148/83 | HR 79 | Ht 69.0 in | Wt 200.6 lb

## 2021-01-14 DIAGNOSIS — G40909 Epilepsy, unspecified, not intractable, without status epilepticus: Secondary | ICD-10-CM

## 2021-01-14 MED ORDER — LEVETIRACETAM 500 MG PO TABS
500.0000 mg | ORAL_TABLET | Freq: Two times a day (BID) | ORAL | 12 refills | Status: DC
Start: 2021-01-14 — End: 2021-03-10

## 2021-01-14 NOTE — Patient Instructions (Signed)
ABNORMAL SPELLS (possible seizure; pain in feet, speech and motor arrest; no convulsions, tongue biting or incontinence; some post-ictal fatigue)  - reviewed importance of stopping substance abuse (particularly crack cocaine)  - improved since LEV 500mg  twice a day; will continue for now  - According to  law, you can not drive unless you are seizure / syncope free for at least 6 months and under physician's care.   - Please maintain precautions. Do not participate in activities where a loss of awareness could harm you or someone else. No swimming alone, no tub bathing, no hot tubs, no driving, no operating motorized vehicles (cars, ATVs, motocycles, etc), lawnmowers, power tools or firearms. No standing at heights, such as rooftops, ladders or stairs. Avoid hot objects such as stoves, heaters, open fires. Wear a helmet when riding a bicycle, scooter, skateboard, etc. and avoid areas of traffic. Set your water heater to 120 degrees or less.

## 2021-01-14 NOTE — Progress Notes (Signed)
[facility]  PATIENT: Katrina Good DOB: 01-18-1971  REFERRING CLINICIAN: Jacquelin Hawking, PA-C HISTORY FROM: Patient and mother REASON FOR VISIT: New consult   HISTORICAL  CHIEF COMPLAINT:  Chief Complaint  Patient presents with  . Seizure-like activity    Rm 6 New Pt, mother- Annice Pih  "seizures started Oct 2021; don't know when I had the last one"    HISTORY OF PRESENT ILLNESS:   51 year old female here for evaluation of seizures.  October 2021 patient went to the emergency room for cough and upper respiratory viral infection symptoms, mentioned episodes of seizure-like activity.  Patient reports episodes where she is standing and walking, feels sudden pain in her feet, freezes, unable to talk or move.  Episodes can last 5 to 10 minutes and then resolve.  Afterwards she may feel somewhat tired and fatigued.  Based on these reports, she was empirically started on levetiracetam 5 mg twice a day.  Since that time episodes have reduced.  She is not sure when she has had her last episode.  Previously she was having several episodes per day and several episodes per week.  Patient also has history of substance abuse (tobacco, alcohol, crack cocaine) and continues to use.  She reports having chronic pain issues, incomplete bladder urination, low back pain, leg spasms, and uses substances to help reduce pain.  He has had extensive imaging of the abdomen, pelvis and lumbar spine which have been unremarkable.    REVIEW OF SYSTEMS: Full 14 system review of systems performed and negative with exception of: As per HPI.  ALLERGIES: Allergies  Allergen Reactions  . Doxycycline Hives  . Strawberry Extract Swelling    Other reaction(s): Facial swelling    HOME MEDICATIONS: Outpatient Medications Prior to Visit  Medication Sig Dispense Refill  . acetaminophen (TYLENOL) 325 MG tablet Take 325 mg by mouth 2 (two) times daily as needed.    Marland Kitchen amLODipine (NORVASC) 5 MG  tablet TAKE 1 Tablet BY MOUTH ONCE DAILY 90 tablet 0  . aspirin EC 81 MG tablet Take 243 mg by mouth daily. Take 3 tabs daily (243 mg)    . diphenhydrAMINE HCl (BENADRYL ALLERGY PO) Take 1 tablet by mouth in the morning and at bedtime.    Marland Kitchen PROVENTIL HFA 108 (90 Base) MCG/ACT inhaler INHALE 2 PUFFS BY MOUTH EVERY 6 HOURS AS NEEDED FOR COUGHING, WHEEZING, OR SHORTNESS OF BREATH 20.1 g 0  . levETIRAcetam (KEPPRA) 500 MG tablet Take 1 tablet (500 mg total) by mouth 2 (two) times daily. 60 tablet 1   No facility-administered medications prior to visit.    PAST MEDICAL HISTORY: Past Medical History:  Diagnosis Date  . Acid reflux   . Asthma   . Bronchitis   . Chronic heel pain, left   . Diabetes mellitus   . Hypertension   . Seizures (HCC)    onset was 08/2020  . Substance abuse (HCC)    pt denies  . Suicidal thoughts    pt denies    PAST SURGICAL HISTORY: Past Surgical History:  Procedure Laterality Date  . ABDOMINAL HYSTERECTOMY     partial  . CESAREAN SECTION     x 2    FAMILY HISTORY: Family History  Problem Relation Age of Onset  . Diabetes Mother   . Hypertension Mother   . Heart failure Mother   . Seizures Father   . Cancer Father   . Hypertension Father   . Heart failure Father   .  Asthma Father     SOCIAL HISTORY: Social History   Socioeconomic History  . Marital status: Single    Spouse name: Not on file  . Number of children: 2  . Years of education: 68  . Highest education level: Not on file  Occupational History    Employer: UNEMPLOYED  Tobacco Use  . Smoking status: Current Every Day Smoker    Packs/day: 2.00    Years: 33.00    Pack years: 66.00    Types: Cigarettes  . Smokeless tobacco: Never Used  . Tobacco comment: 2-3 packs  Vaping Use  . Vaping Use: Never used  Substance and Sexual Activity  . Alcohol use: Not Currently  . Drug use: Yes    Types: Marijuana, "Crack" cocaine    Comment: 01/14/21 used yesterday- crack cocaine; uses  crack (but not cocaine).  not MJ user  . Sexual activity: Not Currently    Birth control/protection: Surgical  Other Topics Concern  . Not on file  Social History Narrative   01/14/21 lives with mother   Social Determinants of Health   Financial Resource Strain: Not on file  Food Insecurity: Not on file  Transportation Needs: Unmet Transportation Needs  . Lack of Transportation (Medical): Yes  . Lack of Transportation (Non-Medical): Yes  Physical Activity: Not on file  Stress: Not on file  Social Connections: Not on file  Intimate Partner Violence: Not on file     PHYSICAL EXAM  GENERAL EXAM/CONSTITUTIONAL: Vitals:  Vitals:   01/14/21 0907  BP: (!) 148/83  Pulse: 79  Weight: 200 lb 9.6 oz (91 kg)  Height: 5\' 9"  (1.753 m)   Body mass index is 29.62 kg/m. Wt Readings from Last 3 Encounters:  01/14/21 200 lb 9.6 oz (91 kg)  11/24/20 203 lb (92.1 kg)  09/29/20 208 lb 8 oz (94.6 kg)    Patient is in no distress; well developed, nourished and groomed; neck is supple  CARDIOVASCULAR:  Examination of carotid arteries is normal; no carotid bruits  Regular rate and rhythm, no murmurs  Examination of peripheral vascular system by observation and palpation is normal  EYES:  Ophthalmoscopic exam of optic discs and posterior segments is normal; no papilledema or hemorrhages No exam data present  MUSCULOSKELETAL:  Gait, strength, tone, movements noted in Neurologic exam below  NEUROLOGIC: MENTAL STATUS:  No flowsheet data found.  awake, alert, oriented to person, place and time  recent and remote memory intact  normal attention and concentration  language fluent, comprehension intact, naming intact  fund of knowledge appropriate  CRANIAL NERVE:   2nd - no papilledema on fundoscopic exam  2nd, 3rd, 4th, 6th - pupils equal and reactive to light, visual fields full to confrontation, extraocular muscles intact, no nystagmus  5th - facial sensation  symmetric  7th - facial strength symmetric  8th - hearing intact  9th - palate elevates symmetrically, uvula midline  11th - shoulder shrug symmetric  12th - tongue protrusion midline  MOTOR:   normal bulk and tone, full strength in the BUE, BLE  SENSORY:   normal and symmetric to light touch, temperature, vibration  COORDINATION:   finger-nose-finger, fine finger movements normal  REFLEXES:   deep tendon reflexes present and symmetric  GAIT/STATION:   narrow based gait     DIAGNOSTIC DATA (LABS, IMAGING, TESTING) - I reviewed patient records, labs, notes, testing and imaging myself where available.  Lab Results  Component Value Date   WBC 9.5 08/28/2020   HGB  15.3 (H) 08/28/2020   HCT 47.1 (H) 08/28/2020   MCV 82.8 08/28/2020   PLT 198 08/28/2020      Component Value Date/Time   NA 140 11/24/2020 1217   K 3.8 11/24/2020 1217   CL 103 11/24/2020 1217   CO2 28 11/24/2020 1217   GLUCOSE 99 11/24/2020 1217   BUN 19 11/24/2020 1217   CREATININE 1.41 (H) 11/24/2020 1217   CALCIUM 9.4 11/24/2020 1217   PROT 7.9 11/24/2020 1217   ALBUMIN 3.7 11/24/2020 1217   AST 16 11/24/2020 1217   ALT 19 11/24/2020 1217   ALKPHOS 118 11/24/2020 1217   BILITOT 0.5 11/24/2020 1217   GFRNONAA 46 (L) 11/24/2020 1217   GFRAA >60 01/04/2020 1512   Lab Results  Component Value Date   CHOL 164 11/24/2020   HDL 48 11/24/2020   LDLCALC 86 11/24/2020   TRIG 152 (H) 11/24/2020   CHOLHDL 3.4 11/24/2020   Lab Results  Component Value Date   HGBA1C 5.9 (H) 11/24/2020   No results found for: VITAMINB12 No results found for: TSH   01/04/20 CT abd/pelvis IMPRESSION: 1. No signs of trauma to the chest, abdomen or pelvis. 2. Signs of hepatic steatosis. 3. Small pulmonary nodules in the right middle lobe. No follow-up needed if patient is low-risk (and has no known or suspected primary neoplasm). Non-contrast chest CT can be considered in 12 months if patient is high-risk.  This recommendation follows the consensus statement: Guidelines for Management of Incidental Pulmonary Nodules Detected on CT Images: From the Fleischner Society 2017; Radiology 2017; 294:765-465.   01/04/20 CT lumbar spine [I reviewed images myself and agree with interpretation. -VRP]  1. Unremarkable CT examination of the lumbar spine. 2. Age advanced atherosclerotic calcifications involving the aorta and iliac arteries.   08/28/20 MRI brain [I reviewed images myself and agree with interpretation. -VRP]  - No acute intracranial process.  Normal appearance of the hippocampi. 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    ASSESSMENT AND PLAN  50 y.o. year old female here with:  Dx:  1. Seizure disorder (HCC)      PLAN:  ABNORMAL SPELLS (possible seizure; pain in feet, speech and motor arrest; no convulsions, tongue biting or incontinence; some post-ictal fatigue)  - reviewed importance of stopping substance abuse (particularly crack cocaine)  - improved since LEV 500mg  twice a day; will continue for now  - According to [place] law, you can not drive unless you are seizure / syncope free for at least 6 months and under physician's care.   - Please maintain precautions. Do not participate in activities where a loss of awareness could harm you or someone else. No swimming alone, no tub bathing, no hot tubs, no driving, no operating motorized vehicles (cars, ATVs, motocycles, etc), lawnmowers, power tools or firearms. No standing at heights, such as rooftops, ladders or stairs. Avoid hot objects such as stoves, heaters, open fires. Wear a helmet when riding a bicycle, scooter, skateboard, etc. and avoid areas of traffic. Set your water heater to 120 degrees or less.   Meds ordered this encounter  Medications  . levETIRAcetam (KEPPRA) 500 MG tablet    Sig: Take 1 tablet (500 mg total) by mouth 2 (two) times daily.    Dispense:  60 tablet    Refill:  12   Return in about 1 year (around  01/14/2022) for with NP (Amy Lomax).    01/16/2022, MD 01/14/2021, 9:44 AM Certified in Neurology, Neurophysiology and Neuroimaging  [facility] [address] [place], [place] [address] [phone]

## 2021-02-06 ENCOUNTER — Telehealth: Payer: Self-pay

## 2021-02-06 ENCOUNTER — Other Ambulatory Visit: Payer: Self-pay

## 2021-02-06 ENCOUNTER — Inpatient Hospital Stay (HOSPITAL_COMMUNITY): Admit: 2021-02-06 | Payer: Self-pay

## 2021-02-06 ENCOUNTER — Other Ambulatory Visit (HOSPITAL_COMMUNITY)
Admission: RE | Admit: 2021-02-06 | Discharge: 2021-02-06 | Disposition: A | Discharge status: Home / Self Care | Payer: Self-pay | Source: Ambulatory Visit | Attending: Nephrology | Admitting: Nephrology

## 2021-02-06 DIAGNOSIS — N1831 Chronic kidney disease, stage 3a: Secondary | ICD-10-CM | POA: Insufficient documentation

## 2021-02-06 NOTE — Telephone Encounter (Signed)
Attempted to call client from a voicemail left on [facility] main line last evening after office hours regarding transportation for today.  No answer, left message to return call as soon as possible.   Francee Nodal RN [employer]

## 2021-02-06 NOTE — Telephone Encounter (Signed)
Called patient at 0900 regarding transportation that was arranged for her nephrology appointment. No answer left message that [employer] had been arranged to pick up client around 12 noon today to take her to her appointment by [employer].  Francee Nodal RN  [facility]ara [facility]

## 2021-02-09 ENCOUNTER — Other Ambulatory Visit: Payer: Self-pay

## 2021-02-09 ENCOUNTER — Other Ambulatory Visit (HOSPITAL_COMMUNITY)
Admission: RE | Admit: 2021-02-09 | Discharge: 2021-02-09 | Disposition: A | Discharge status: Home / Self Care | Payer: Self-pay | Source: Ambulatory Visit | Attending: Nephrology | Admitting: Nephrology

## 2021-02-09 DIAGNOSIS — N1831 Chronic kidney disease, stage 3a: Secondary | ICD-10-CM | POA: Insufficient documentation

## 2021-02-09 LAB — HIV ANTIBODY (ROUTINE TESTING W REFLEX): HIV Screen 4th Generation wRfx: NONREACTIVE

## 2021-02-09 LAB — IRON AND TIBC
Iron: 54 ug/dL (ref 28–170)
Saturation Ratios: 15 % (ref 10.4–31.8)
TIBC: 358 ug/dL (ref 250–450)
UIBC: 304 ug/dL

## 2021-02-09 LAB — HEPATITIS C ANTIBODY: HCV Ab: NONREACTIVE

## 2021-02-09 LAB — CBC WITH DIFFERENTIAL/PLATELET
Abs Immature Granulocytes: 0.03 10*3/uL (ref 0.00–0.07)
Basophils Absolute: 0.1 10*3/uL (ref 0.0–0.1)
Basophils Relative: 1 %
Eosinophils Absolute: 0.1 10*3/uL (ref 0.0–0.5)
Eosinophils Relative: 2 %
HCT: 46.4 % — ABNORMAL HIGH (ref 36.0–46.0)
Hemoglobin: 14.6 g/dL (ref 12.0–15.0)
Immature Granulocytes: 1 %
Lymphocytes Relative: 29 %
Lymphs Abs: 1.9 10*3/uL (ref 0.7–4.0)
MCH: 27.2 pg (ref 26.0–34.0)
MCHC: 31.5 g/dL (ref 30.0–36.0)
MCV: 86.6 fL (ref 80.0–100.0)
Monocytes Absolute: 0.7 10*3/uL (ref 0.1–1.0)
Monocytes Relative: 10 %
Neutro Abs: 3.8 10*3/uL (ref 1.7–7.7)
Neutrophils Relative %: 57 %
Platelets: 188 10*3/uL (ref 150–400)
RBC: 5.36 MIL/uL — ABNORMAL HIGH (ref 3.87–5.11)
RDW: 15.3 % (ref 11.5–15.5)
WBC: 6.6 10*3/uL (ref 4.0–10.5)
nRBC: 0 % (ref 0.0–0.2)

## 2021-02-09 LAB — COMPREHENSIVE METABOLIC PANEL
ALT: 14 U/L (ref 0–44)
AST: 15 U/L (ref 15–41)
Albumin: 3.7 g/dL (ref 3.5–5.0)
Alkaline Phosphatase: 116 U/L (ref 38–126)
Anion gap: 8 (ref 5–15)
BUN: 12 mg/dL (ref 6–20)
CO2: 23 mmol/L (ref 22–32)
Calcium: 8.9 mg/dL (ref 8.9–10.3)
Chloride: 108 mmol/L (ref 98–111)
Creatinine, Ser: 0.69 mg/dL (ref 0.44–1.00)
GFR, Estimated: >60 mL/min (ref >60)
Glucose, Bld: 106 mg/dL — ABNORMAL HIGH (ref 70–99)
Potassium: 3.6 mmol/L (ref 3.5–5.1)
Sodium: 139 mmol/L (ref 135–145)
Total Bilirubin: 0.4 mg/dL (ref 0.3–1.2)
Total Protein: 7.5 g/dL (ref 6.5–8.1)

## 2021-02-09 LAB — PHOSPHORUS: Phosphorus: 3.3 mg/dL (ref 2.5–4.6)

## 2021-02-09 LAB — HEMOGLOBIN A1C
Hgb A1c MFr Bld: 6.1 % — ABNORMAL HIGH (ref 4.8–5.6)
Mean Plasma Glucose: 128.37 mg/dL

## 2021-02-09 LAB — PROTEIN / CREATININE RATIO, URINE
Creatinine, Urine: 225.03 mg/dL
Protein Creatinine Ratio: 0.08 mg/mg{Cre} (ref 0.00–0.15)
Total Protein, Urine: 18 mg/dL

## 2021-02-09 LAB — CREATININE CLEARANCE, URINE, 24 HOUR
Collection Interval-CRCL: 24 hours
Creatinine Clearance: 70 mL/min — ABNORMAL LOW (ref 75–115)
Creatinine, 24H Ur: 693 mg/d (ref 600–1800)
Creatinine, Urine: 138.5 mg/dL
Urine Total Volume-CRCL: 500 mL

## 2021-02-09 LAB — CREATININE, URINE, 24 HOUR
Collection Interval-UCRE24: 24 hours
Creatinine, 24H Ur: 697 mg/d (ref 600–1800)
Creatinine, Urine: 139.37 mg/dL
Urine Total Volume-UCRE24: 500 mL

## 2021-02-09 LAB — VITAMIN D 25 HYDROXY (VIT D DEFICIENCY, FRACTURES): Vit D, 25-Hydroxy: 12.84 ng/mL — ABNORMAL LOW (ref 30–100)

## 2021-02-09 LAB — MAGNESIUM: Magnesium: 2.3 mg/dL (ref 1.7–2.4)

## 2021-02-09 LAB — HEPATITIS B SURFACE ANTIGEN: Hepatitis B Surface Ag: NONREACTIVE

## 2021-02-09 LAB — FERRITIN: Ferritin: 75 ng/mL (ref 11–307)

## 2021-02-09 LAB — VITAMIN B12: Vitamin B-12: 223 pg/mL (ref 180–914)

## 2021-02-09 LAB — URIC ACID: Uric Acid, Serum: 4.7 mg/dL (ref 2.5–7.1)

## 2021-02-10 LAB — PTH, INTACT AND CALCIUM
Calcium, Total (PTH): 9.2 mg/dL (ref 8.7–10.2)
PTH: 40 pg/mL (ref 15–65)

## 2021-02-10 LAB — KAPPA/LAMBDA LIGHT CHAINS
Kappa free light chain: 31.7 mg/L — ABNORMAL HIGH (ref 3.3–19.4)
Kappa, lambda light chain ratio: 1.73 — ABNORMAL HIGH (ref 0.26–1.65)
Lambda free light chains: 18.3 mg/L (ref 5.7–26.3)

## 2021-02-10 LAB — MISC LABCORP TEST (SEND OUT): Labcorp test code: 141330

## 2021-02-10 LAB — ANA: Anti Nuclear Antibody (ANA): NEGATIVE

## 2021-02-10 LAB — HEPATITIS B SURFACE ANTIBODY, QUANTITATIVE: Hep B S AB Quant (Post): <3.1 m[IU]/mL — ABNORMAL LOW (ref >9.9)

## 2021-02-10 LAB — C3 COMPLEMENT: C3 Complement: 144 mg/dL (ref 82–167)

## 2021-02-10 LAB — GLOMERULAR BASEMENT MEMBRANE ANTIBODIES: GBM Ab: 3 units (ref 0–20)

## 2021-02-10 LAB — C4 COMPLEMENT: Complement C4, Body Fluid: 33 mg/dL (ref 12–38)

## 2021-02-11 LAB — UIFE/LIGHT CHAINS/TP QN, 24-HR UR
FR KAPPA LT CH,24HR: 44.94 mg/24 hr
FR LAMBDA LT CH,24HR: 6.25 mg/24 hr
Free Kappa Lt Chains,Ur: 89.88 mg/L — ABNORMAL HIGH (ref 1.17–86.46)
Free Kappa/Lambda Ratio: 7.2 (ref 1.83–14.26)
Free Lambda Lt Chains,Ur: 12.49 mg/L (ref 0.27–15.21)
Total Protein, Urine-Ur/day: 80 mg/24 hr (ref 30–150)
Total Protein, Urine: 16 mg/dL
Total Volume: 500

## 2021-02-11 LAB — ANCA TITERS
Atypical P-ANCA titer: <1:20 {titer}
C-ANCA: <1:20 {titer}
P-ANCA: <1:20 {titer}

## 2021-02-11 LAB — PROTEIN ELECTROPHORESIS, SERUM
A/G Ratio: 1 (ref 0.7–1.7)
Albumin ELP: 3.5 g/dL (ref 2.9–4.4)
Alpha-1-Globulin: 0.2 g/dL (ref 0.0–0.4)
Alpha-2-Globulin: 0.7 g/dL (ref 0.4–1.0)
Beta Globulin: 1 g/dL (ref 0.7–1.3)
Gamma Globulin: 1.6 g/dL (ref 0.4–1.8)
Globulin, Total: 3.5 g/dL (ref 2.2–3.9)
Total Protein ELP: 7 g/dL (ref 6.0–8.5)

## 2021-02-11 LAB — IMMUNOFIXATION, URINE

## 2021-02-14 LAB — HEPATITIS C GENOTYPE

## 2021-02-23 ENCOUNTER — Ambulatory Visit: Payer: Self-pay | Admitting: Physician Assistant

## 2021-02-23 ENCOUNTER — Encounter: Payer: Self-pay | Admitting: Physician Assistant

## 2021-02-23 VITALS — BP 120/78 | HR 75 | Temp 97.3°F | Wt 193.0 lb

## 2021-02-23 DIAGNOSIS — F17200 Nicotine dependence, unspecified, uncomplicated: Secondary | ICD-10-CM

## 2021-02-23 DIAGNOSIS — F1910 Other psychoactive substance abuse, uncomplicated: Secondary | ICD-10-CM

## 2021-02-23 DIAGNOSIS — Z1211 Encounter for screening for malignant neoplasm of colon: Secondary | ICD-10-CM

## 2021-02-23 DIAGNOSIS — R918 Other nonspecific abnormal finding of lung field: Secondary | ICD-10-CM

## 2021-02-23 DIAGNOSIS — N289 Disorder of kidney and ureter, unspecified: Secondary | ICD-10-CM

## 2021-02-23 DIAGNOSIS — I10 Essential (primary) hypertension: Secondary | ICD-10-CM

## 2021-02-23 DIAGNOSIS — G40909 Epilepsy, unspecified, not intractable, without status epilepticus: Secondary | ICD-10-CM

## 2021-02-23 NOTE — Progress Notes (Signed)
BP 120/78   Pulse 75   Temp (!) 97.3 F (36.3 C)   Wt 193 lb (87.5 kg)   SpO2 93%   BMI 28.50 kg/m    Subjective:    Patient ID: Katrina Good, female    DOB: 1971/06/16, 50 y.o.   MRN: [id number]  HPI: Katrina Good is a 50 y.o. female presenting on 02/23/2021 for Hypertension   HPI   Pt had a negative covid 19 screening questionnaire.  Chief Complaint  Patient presents with  . Hypertension      She is currently seeing nephrologist for ckd and neurology for seizures.    Pt is still smoking but says she has cut down a lot.   CT done 01/05/21 showed pulmonary nodules and recomended to repeat 12 months.  She has cut back on drugs but is still using some.       Relevant past medical, surgical, family and social history reviewed and updated as indicated. Interim medical history since our last visit reviewed. Allergies and medications reviewed and updated.   Current Outpatient Medications:  .  acetaminophen (TYLENOL) 325 MG tablet, Take 325 mg by mouth 2 (two) times daily as needed., Disp: , Rfl:  .  amLODipine (NORVASC) 5 MG tablet, TAKE 1 Tablet BY MOUTH ONCE DAILY, Disp: 90 tablet, Rfl: 0 .  aspirin EC 81 MG tablet, Take 243 mg by mouth daily. Take 3 tabs daily (243 mg), Disp: , Rfl:  .  levETIRAcetam (KEPPRA) 500 MG tablet, Take 1 tablet (500 mg total) by mouth 2 (two) times daily., Disp: 60 tablet, Rfl: 12 .  PROVENTIL HFA 108 (90 Base) MCG/ACT inhaler, INHALE 2 PUFFS BY MOUTH EVERY 6 HOURS AS NEEDED FOR COUGHING, WHEEZING, OR SHORTNESS OF BREATH, Disp: 20.1 g, Rfl: 0 .  diphenhydrAMINE HCl (BENADRYL ALLERGY PO), Take 1 tablet by mouth in the morning and at bedtime. (Patient not taking: Reported on 02/23/2021), Disp: , Rfl:     Review of Systems  Per HPI unless specifically indicated above     Objective:    BP 120/78   Pulse 75   Temp (!) 97.3 F (36.3 C)   Wt 193 lb (87.5 kg)   SpO2 93%   BMI 28.50 kg/m   Wt Readings from Last 3 Encounters:   02/23/21 193 lb (87.5 kg)  01/14/21 200 lb 9.6 oz (91 kg)  11/24/20 203 lb (92.1 kg)    Physical Exam Vitals reviewed.  Constitutional:      General: She is not in acute distress.    Appearance: She is well-developed. She is not toxic-appearing.  HENT:     Head: Normocephalic and atraumatic.  Cardiovascular:     Rate and Rhythm: Normal rate and regular rhythm.  Pulmonary:     Effort: Pulmonary effort is normal.     Breath sounds: Normal breath sounds.  Abdominal:     General: Bowel sounds are normal.     Palpations: Abdomen is soft. There is no mass.     Tenderness: There is no abdominal tenderness.  Musculoskeletal:     Cervical back: Neck supple.     Right lower leg: No edema.     Left lower leg: No edema.  Lymphadenopathy:     Cervical: No cervical adenopathy.  Skin:    General: Skin is warm and dry.  Neurological:     Mental Status: She is alert and oriented to person, place, and time.  Psychiatric:  Behavior: Behavior normal.              Assessment & Plan:    Encounter Diagnoses  Name Primary?  . Primary hypertension Yes  . Impaired renal function   . Seizure disorder (HCC)   . Tobacco use disorder   . Pulmonary nodules   . Substance abuse (HCC)   . Screening for colon cancer     -pt to continue current medications -pt is given FIT test for colon cancer screening -pt to continue with her specialists per their recommendations -encouraged smoking cessation and abstaining from drugs -pt to follow up 3 months.  She is to contact office sooner prn

## 2021-02-24 ENCOUNTER — Other Ambulatory Visit: Payer: Self-pay | Admitting: Physician Assistant

## 2021-02-24 DIAGNOSIS — Z1211 Encounter for screening for malignant neoplasm of colon: Secondary | ICD-10-CM

## 2021-03-03 ENCOUNTER — Other Ambulatory Visit: Payer: Self-pay | Admitting: Physician Assistant

## 2021-03-05 ENCOUNTER — Other Ambulatory Visit: Payer: Self-pay | Admitting: Physician Assistant

## 2021-03-05 ENCOUNTER — Telehealth: Payer: Self-pay | Admitting: Diagnostic Neuroimaging

## 2021-03-05 NOTE — Telephone Encounter (Signed)
Called patient to inform her Dr Marjory Lies refilled levetiracetam in March x 1 year. No answer and VMB full. If patient calls back phone staff may advise her she can call pharmacy and get refill.

## 2021-03-05 NOTE — Telephone Encounter (Signed)
Pt is requesting a refill for levETIRAcetam (KEPPRA) 500 MG tablet.  Pharmacy:  [facility] [phone]

## 2021-03-05 NOTE — Telephone Encounter (Signed)
Patient called and I advised her there should be refills on file. She stated [facility] told her no refills. I advised I'll call them. Patient verbalized understanding, appreciation. Called [facility], spoke with Brett Canales who stated they have levetiracetam refills on file. He'll get refill ready for patient. Called her and it went to VM but VM not set up. If she calls back phone staff may please let her know Jordan Hawks is refillling it for her.

## 2021-03-09 ENCOUNTER — Telehealth: Payer: Self-pay

## 2021-03-09 LAB — IFOBT (OCCULT BLOOD): IFOBT: NEGATIVE

## 2021-03-09 NOTE — Telephone Encounter (Signed)
Client called needing transportation but she is not sure of date and time and to where she is going and she is not currently at home she will call back.  Also client calling asking for assistance with her seizure medication levetiracetam and ergocalciferol. Both are at [facility] and client has no income.  Seizure medication not available with [facility]. Called [facility] and it is not available at that pharmacy.  Will contact [facility] [facility] for any possible assistance. Will plan to contact client 03/10/21 with any new information.  Francee Nodal RN [employer]

## 2021-03-10 ENCOUNTER — Other Ambulatory Visit: Payer: Self-pay

## 2021-03-10 ENCOUNTER — Encounter (HOSPITAL_COMMUNITY): Payer: Self-pay | Admitting: Emergency Medicine

## 2021-03-10 ENCOUNTER — Emergency Department (HOSPITAL_COMMUNITY)
Admission: EM | Admit: 2021-03-10 | Discharge: 2021-03-10 | Disposition: A | Discharge status: Home / Self Care | Payer: Self-pay | Attending: Emergency Medicine | Admitting: Emergency Medicine

## 2021-03-10 DIAGNOSIS — G40909 Epilepsy, unspecified, not intractable, without status epilepticus: Secondary | ICD-10-CM | POA: Insufficient documentation

## 2021-03-10 DIAGNOSIS — J45909 Unspecified asthma, uncomplicated: Secondary | ICD-10-CM | POA: Insufficient documentation

## 2021-03-10 DIAGNOSIS — E119 Type 2 diabetes mellitus without complications: Secondary | ICD-10-CM | POA: Insufficient documentation

## 2021-03-10 DIAGNOSIS — R569 Unspecified convulsions: Secondary | ICD-10-CM

## 2021-03-10 DIAGNOSIS — I10 Essential (primary) hypertension: Secondary | ICD-10-CM | POA: Insufficient documentation

## 2021-03-10 DIAGNOSIS — F17210 Nicotine dependence, cigarettes, uncomplicated: Secondary | ICD-10-CM | POA: Insufficient documentation

## 2021-03-10 LAB — COMPREHENSIVE METABOLIC PANEL
ALT: 20 U/L (ref 0–44)
AST: 20 U/L (ref 15–41)
Albumin: 4 g/dL (ref 3.5–5.0)
Alkaline Phosphatase: 115 U/L (ref 38–126)
Anion gap: 8 (ref 5–15)
BUN: 20 mg/dL (ref 6–20)
CO2: 24 mmol/L (ref 22–32)
Calcium: 9.1 mg/dL (ref 8.9–10.3)
Chloride: 105 mmol/L (ref 98–111)
Creatinine, Ser: 0.83 mg/dL (ref 0.44–1.00)
GFR, Estimated: >60 mL/min (ref >60)
Glucose, Bld: 85 mg/dL (ref 70–99)
Potassium: 4 mmol/L (ref 3.5–5.1)
Sodium: 137 mmol/L (ref 135–145)
Total Bilirubin: 0.7 mg/dL (ref 0.3–1.2)
Total Protein: 7.9 g/dL (ref 6.5–8.1)

## 2021-03-10 LAB — CBC
HCT: 49.5 % — ABNORMAL HIGH (ref 36.0–46.0)
Hemoglobin: 15.7 g/dL — ABNORMAL HIGH (ref 12.0–15.0)
MCH: 27.3 pg (ref 26.0–34.0)
MCHC: 31.7 g/dL (ref 30.0–36.0)
MCV: 86.1 fL (ref 80.0–100.0)
Platelets: 179 10*3/uL (ref 150–400)
RBC: 5.75 MIL/uL — ABNORMAL HIGH (ref 3.87–5.11)
RDW: 14.7 % (ref 11.5–15.5)
WBC: 7.7 10*3/uL (ref 4.0–10.5)
nRBC: 0 % (ref 0.0–0.2)

## 2021-03-10 LAB — CBG MONITORING, ED: Glucose-Capillary: 93 mg/dL (ref 70–99)

## 2021-03-10 LAB — ETHANOL: Alcohol, Ethyl (B): 62 mg/dL — ABNORMAL HIGH (ref <10)

## 2021-03-10 MED ORDER — SODIUM CHLORIDE 0.9 % IV BOLUS
1000.0000 mL | Freq: Once | INTRAVENOUS | Status: AC
  Administered 2021-03-10: 1000 mL via INTRAVENOUS

## 2021-03-10 MED ORDER — SODIUM CHLORIDE 0.9 % IV SOLN: 3000.0000 mg | Freq: Once | INTRAVENOUS | Status: DC

## 2021-03-10 MED ORDER — LEVETIRACETAM IN NACL 1500 MG/100ML IV SOLN
1500.0000 mg | INTRAVENOUS | Status: AC
  Administered 2021-03-10 (×2): 1500 mg via INTRAVENOUS
  Filled 2021-03-10 (×2): qty 100

## 2021-03-10 MED ORDER — LEVETIRACETAM 500 MG PO TABS: 500.0000 mg | ORAL_TABLET | Freq: Two times a day (BID) | ORAL | 1 refills | Status: DC

## 2021-03-10 NOTE — ED Triage Notes (Signed)
Pt c/o having seizures at home and called ems. EMS witnessed questionable seizure activity and gave pt 5mg  versed. Pt has not had keppra in 2 days but has been drinking alcohol and doing drugs per son.

## 2021-03-10 NOTE — ED Provider Notes (Signed)
AP-EMERGENCY DEPT [facility] Emergency Department Provider Note MRN:  [id number]  Arrival date & time: 03/10/21     Chief Complaint   Seizures   History of Present Illness   Katrina Good is a 50 y.o. year-old female with a history of hypertension, diabetes, seizure disorder presenting to the ED with chief complaint of seizure.  Patient reportedly has not been taking her Keppra for the past 2 days, also drinking heavily.  Seizure activity at home, given Versed from EMS.  I was unable to obtain an accurate HPI, PMH, or ROS due to the patient's altered mental status.  Level 5 caveat.  Review of Systems  Positive for seizure behavior.  Patient's Health History    Past Medical History:  Diagnosis Date  . Acid reflux   . Asthma   . Bronchitis   . Chronic heel pain, left   . Diabetes mellitus   . Hypertension   . Seizures (HCC)    onset was 08/2020  . Substance abuse (HCC)    pt denies  . Suicidal thoughts    pt denies    Past Surgical History:  Procedure Laterality Date  . ABDOMINAL HYSTERECTOMY     partial  . CESAREAN SECTION     x 2    Family History  Problem Relation Age of Onset  . Diabetes Mother   . Hypertension Mother   . Heart failure Mother   . Seizures Father   . Cancer Father   . Hypertension Father   . Heart failure Father   . Asthma Father     Social History   Socioeconomic History  . Marital status: Single    Spouse name: Not on file  . Number of children: 2  . Years of education: 79  . Highest education level: Not on file  Occupational History    Employer: UNEMPLOYED  Tobacco Use  . Smoking status: Current Every Day Smoker    Packs/day: 2.00    Years: 33.00    Pack years: 66.00    Types: Cigarettes  . Smokeless tobacco: Never Used  . Tobacco comment: 2-3 packs  Vaping Use  . Vaping Use: Never used  Substance and Sexual Activity  . Alcohol use: Not Currently  . Drug use: Yes    Types: Marijuana, "Crack" cocaine     Comment: 01/14/21 used yesterday- crack cocaine; uses crack (but not cocaine).  not MJ user  . Sexual activity: Not Currently    Birth control/protection: Surgical  Other Topics Concern  . Not on file  Social History Narrative   01/14/21 lives with mother   Social Determinants of Health   Financial Resource Strain: Not on file  Food Insecurity: Not on file  Transportation Needs: Unmet Transportation Needs  . Lack of Transportation (Medical): Yes  . Lack of Transportation (Non-Medical): Yes  Physical Activity: Not on file  Stress: Not on file  Social Connections: Not on file  Intimate Partner Violence: Not on file     Physical Exam   Vitals:   03/10/21 0300 03/10/21 0330  BP: 127/79 (!) 148/92  Pulse: 85 80  Resp: 19 17  SpO2: 92% 94%    CONSTITUTIONAL: Well-appearing, NAD NEURO: Somnolent, difficult to wake, does follow commands, moves all extremities equally EYES:  eyes equal and reactive ENT/NECK:  no LAD, no JVD CARDIO: Regular rate, well-perfused, normal S1 and S2 PULM:  CTAB no wheezing or rhonchi GI/GU:  normal bowel sounds, non-distended, non-tender MSK/SPINE:  No gross deformities,  no edema SKIN:  no rash, atraumatic PSYCH:  Appropriate speech and behavior  *Additional and/or pertinent findings included in MDM below  Diagnostic and Interventional Summary    EKG Interpretation  Date/Time:    Ventricular Rate:    PR Interval:    QRS Duration:   QT Interval:    QTC Calculation:   R Axis:     Text Interpretation:        Labs Reviewed  CBC - Abnormal; Notable for the following components:      Result Value   RBC 5.75 (*)    Hemoglobin 15.7 (*)    HCT 49.5 (*)    All other components within normal limits  ETHANOL - Abnormal; Notable for the following components:   Alcohol, Ethyl (B) 62 (*)    All other components within normal limits  COMPREHENSIVE METABOLIC PANEL  CBG MONITORING, ED    No orders to display    Medications  sodium chloride 0.9  % bolus 1,000 mL (1,000 mLs Intravenous New Bag/Given 03/10/21 0319)  levETIRAcetam (KEPPRA) IVPB 1500 mg/ 100 mL premix (1,500 mg Intravenous New Bag/Given 03/10/21 0345)     Procedures  /  Critical Care Procedures  ED Course and Medical Decision Making  I have reviewed the triage vital signs, the nursing notes, and pertinent available records from the EMR.  Listed above are laboratory and imaging tests that I personally ordered, reviewed, and interpreted and then considered in my medical decision making (see below for details).  Seizure, likely due to medication noncompliance.  Also suspect alcohol intoxication.  Will load with Keppra.  Patient has Versed on board.  Protecting airway, vitals normal.     On reassessment patient is doing much better, conversant.  Normal neurological exam.  Work-up is reassuring, patient was loaded with Keppra, advised compliance at home.  Patient requesting discharge, will follow up with her regular doctors.  Elmer Sow. Pilar Plate, MD [facility] Emergency Medicine [facility] [email or web address]  Final Clinical Impressions(s) / ED Diagnoses     ICD-10-CM   1. Seizure (HCC)  R56.9     ED Discharge Orders         Ordered    levETIRAcetam (KEPPRA) 500 MG tablet  2 times daily        03/10/21 0401           Discharge Instructions Discussed with and Provided to Patient:     Discharge Instructions     You were evaluated in the Emergency Department and after careful evaluation, we did not find any emergent condition requiring admission or further testing in the hospital.  Your exam/testing today was overall reassuring.  It is important that you take your seizure medication at home as prescribed.  We also recommend avoiding alcohol.  Please return to the Emergency Department if you experience any worsening of your condition.  Thank you for allowing [place] to be a part of your care.        Sabas Sous, MD 03/10/21 [phone]

## 2021-03-10 NOTE — ED Notes (Signed)
Pt requests to leave and to speak to physician. Dr. Pilar Plate notified and in to speak with pt.

## 2021-03-10 NOTE — Discharge Instructions (Addendum)
You were evaluated in the Emergency Department and after careful evaluation, we did not find any emergent condition requiring admission or further testing in the hospital.  Your exam/testing today was overall reassuring.  It is important that you take your seizure medication at home as prescribed.  We also recommend avoiding alcohol.  Please return to the Emergency Department if you experience any worsening of your condition.  Thank you for allowing [place] to be a part of your care.

## 2021-03-10 NOTE — ED Notes (Signed)
Pt given paper scrubs due to her personal clothing being wet with urine. Pt yelling at significant other in room. Security called to dept in case of escalation.

## 2021-03-10 NOTE — ED Notes (Signed)
Pt placed on cardiac monitor with BP to set cycle every 30 minutes. Continuous pulse oximeter applied.  

## 2021-03-18 ENCOUNTER — Telehealth: Payer: Self-pay

## 2021-03-18 ENCOUNTER — Other Ambulatory Visit: Payer: Self-pay | Admitting: Physician Assistant

## 2021-03-18 MED ORDER — ALBUTEROL SULFATE HFA 108 (90 BASE) MCG/ACT IN AERS: INHALATION_SPRAY | RESPIRATORY_TRACT | 0 refills | Status: DC

## 2021-03-18 MED ORDER — AMLODIPINE BESYLATE 5 MG PO TABS: 1.0000 | ORAL_TABLET | Freq: Every day | ORAL | 0 refills | Status: DC

## 2021-03-18 NOTE — Telephone Encounter (Signed)
Client called stating she still cannot get her Levetiracetam (keppra) nor her Vit D medications at [facility].  This RN has called [facility] pharmacy as well as [facility] at [facility] and generic Keppra cannot be obtained at no charge, no patient assistance. Price at [facility] 9.00$ for one month Ergocalciferol is 13.21 at [facility], appears it may be available at [facility] for no charge, encouraged client to call her nephrologist to ask if they could send that prescription to [facility].  Will provide client [facility] gift card once more to assist with Keppra and ergocalciferol. Explained the importance of calling her provider regarding ergocalciferol .  Client also states she is out of her amlodipine and almost out of her "inhaler" at first she states she did not get "any in the mail from [facility]" after reviewing Epic Medication list determined [facility] provider sent both prescriptions to [facility] in 12/2020. Client then states that she did receive those, but she is due to get more. Asked client if she had called the number to call in her refills and explained [facility] works like any pharmacy,call for refills before you "run out" and if they need to contact the provider for refills they will send a request.  Client states she did not know that, explained the process again and urged client to keep her bottles or boxes with her prescription numbers and pharmacy phone number so she can call them in. She reports understanding. She states she is out of both, upon review, she had no refills at [facility]. Will contact [facility] to request provider send refills in of Amlodipine and albuterol inhaler, but in future she would need to call [facility] as explained. She reports understanding.  Client will get transportation to [employer] today for a one time assistance of [employer] gift card to assist in obtaining her Keppra and ergocalciferol. Client reports understanding.  Francee Nodal RN [facility]  [employer]

## 2021-03-19 ENCOUNTER — Telehealth: Payer: Self-pay

## 2021-03-19 NOTE — Telephone Encounter (Signed)
Called to confirmed with client that she was able to get her keppra and ergocalciferol. Client did state she has received those medications.  Reminded client that she will receive her albuterol and amlodipine through [facility]. Reminded client to save her bottles and to call that prescription into [facility] before she runs out next time. She states understanding.  Client states she will call her nephrologist regarding sending her Vit D to [facility].  Will follow as needed.  Francee Nodal RN [employer]

## 2021-03-24 ENCOUNTER — Encounter: Payer: Self-pay | Admitting: Physician Assistant

## 2021-03-24 ENCOUNTER — Other Ambulatory Visit: Payer: Self-pay

## 2021-03-24 ENCOUNTER — Ambulatory Visit: Payer: Self-pay | Admitting: Physician Assistant

## 2021-03-24 VITALS — BP 136/89 | HR 71 | Temp 97.2°F

## 2021-03-24 DIAGNOSIS — M25561 Pain in right knee: Secondary | ICD-10-CM

## 2021-03-24 DIAGNOSIS — M79651 Pain in right thigh: Secondary | ICD-10-CM

## 2021-03-24 MED ORDER — DICLOFENAC SODIUM 75 MG PO TBEC: 75.0000 mg | DELAYED_RELEASE_TABLET | Freq: Two times a day (BID) | ORAL | 0 refills | Status: DC | PRN

## 2021-03-24 NOTE — Progress Notes (Signed)
   BP 136/89   Pulse 71   Temp (!) 97.2 F (36.2 C)   SpO2 97%    Subjective:    Patient ID: Katrina Good, female    DOB: [birth year], 50 y.o.   MRN: [id number]  HPI: Katrina Good is a 50 y.o. female presenting on 03/24/2021 for No chief complaint on file.   HPI Pt had a negative covid 19 screening questionnaire.   Pt is in today with complaints of R knee pain x 2 wk.  Denies injury.  Reports cramps at times in the R knee and thigh also.    Pt seen in ER 03/10/21 with seizure and etoh.  Possible injury at that time but pt is not inclined to think so.   Relevant past medical, surgical, family and social history reviewed and updated as indicated. Interim medical history since our last visit reviewed. Allergies and medications reviewed and updated.  Review of Systems  Per HPI unless specifically indicated above     Objective:    BP 136/89   Pulse 71   Temp (!) 97.2 F (36.2 C)   SpO2 97%   Wt Readings from Last 3 Encounters:  03/10/21 198 lb 6.6 oz (90 kg)  02/23/21 193 lb (87.5 kg)  01/14/21 200 lb 9.6 oz (91 kg)    Physical Exam Constitutional:      General: She is not in acute distress.    Appearance: She is not toxic-appearing.  HENT:     Head: Normocephalic and atraumatic.  Cardiovascular:     Pulses:          Dorsalis pedis pulses are 2+ on the right side and 2+ on the left side.  Pulmonary:     Effort: No respiratory distress.  Musculoskeletal:     Right upper leg: Tenderness present. No swelling, deformity or bony tenderness.     Right knee: No swelling, deformity or erythema. Normal range of motion. Tenderness present. Normal alignment.     Right lower leg: No swelling, deformity or tenderness. No edema.     Left lower leg: No edema.     Right ankle: No swelling. No tenderness.     Right foot: No swelling or tenderness.     Comments: R knee with mild diffuse tenderness.  No point tenderness.  FROM.  No instability.  No swelling or effusion.   R  thigh with ecchymosis laterally and some knotting underneath the bruise.  There is no erythema or swelling.   Some spider veins noted upper and lower right leg.   Skin:    General: Skin is warm and dry.  Neurological:     Mental Status: She is alert and oriented to person, place, and time.  Psychiatric:        Behavior: Behavior normal.              Assessment & Plan:     Encounter Diagnoses  Name Primary?  . Right knee pain, unspecified chronicity Yes  . Right thigh pain       -Pt encouraged to use some heat on the area to lessen cramps and pain.  rx diclofenac to use prn pain.   -Will recheck at July appointment to ensure resolution.  Pt to contact office for worsening prior to that time.

## 2021-04-01 DIAGNOSIS — Z0271 Encounter for disability determination: Secondary | ICD-10-CM

## 2021-05-07 ENCOUNTER — Encounter (HOSPITAL_COMMUNITY): Payer: Self-pay

## 2021-05-07 ENCOUNTER — Emergency Department (HOSPITAL_COMMUNITY)
Admission: EM | Admit: 2021-05-07 | Discharge: 2021-05-07 | Disposition: A | Discharge status: Home / Self Care | Payer: Self-pay | Attending: Emergency Medicine | Admitting: Emergency Medicine

## 2021-05-07 ENCOUNTER — Other Ambulatory Visit: Payer: Self-pay

## 2021-05-07 DIAGNOSIS — Z79899 Other long term (current) drug therapy: Secondary | ICD-10-CM | POA: Insufficient documentation

## 2021-05-07 DIAGNOSIS — I10 Essential (primary) hypertension: Secondary | ICD-10-CM | POA: Insufficient documentation

## 2021-05-07 DIAGNOSIS — E119 Type 2 diabetes mellitus without complications: Secondary | ICD-10-CM | POA: Insufficient documentation

## 2021-05-07 DIAGNOSIS — F1910 Other psychoactive substance abuse, uncomplicated: Secondary | ICD-10-CM | POA: Insufficient documentation

## 2021-05-07 DIAGNOSIS — R410 Disorientation, unspecified: Secondary | ICD-10-CM | POA: Insufficient documentation

## 2021-05-07 DIAGNOSIS — J45909 Unspecified asthma, uncomplicated: Secondary | ICD-10-CM | POA: Insufficient documentation

## 2021-05-07 DIAGNOSIS — Y92009 Unspecified place in unspecified non-institutional (private) residence as the place of occurrence of the external cause: Secondary | ICD-10-CM | POA: Insufficient documentation

## 2021-05-07 DIAGNOSIS — R569 Unspecified convulsions: Secondary | ICD-10-CM | POA: Insufficient documentation

## 2021-05-07 DIAGNOSIS — F17210 Nicotine dependence, cigarettes, uncomplicated: Secondary | ICD-10-CM | POA: Insufficient documentation

## 2021-05-07 DIAGNOSIS — Z7982 Long term (current) use of aspirin: Secondary | ICD-10-CM | POA: Insufficient documentation

## 2021-05-07 LAB — CBC WITH DIFFERENTIAL/PLATELET
Abs Immature Granulocytes: 0.04 10*3/uL (ref 0.00–0.07)
Basophils Absolute: 0.1 10*3/uL (ref 0.0–0.1)
Basophils Relative: 1 %
Eosinophils Absolute: 0.3 10*3/uL (ref 0.0–0.5)
Eosinophils Relative: 4 %
HCT: 43.3 % (ref 36.0–46.0)
Hemoglobin: 14.6 g/dL (ref 12.0–15.0)
Immature Granulocytes: 1 %
Lymphocytes Relative: 36 %
Lymphs Abs: 2.6 10*3/uL (ref 0.7–4.0)
MCH: 28 pg (ref 26.0–34.0)
MCHC: 33.7 g/dL (ref 30.0–36.0)
MCV: 83 fL (ref 80.0–100.0)
Monocytes Absolute: 0.7 10*3/uL (ref 0.1–1.0)
Monocytes Relative: 10 %
Neutro Abs: 3.4 10*3/uL (ref 1.7–7.7)
Neutrophils Relative %: 48 %
Platelets: 182 10*3/uL (ref 150–400)
RBC: 5.22 MIL/uL — ABNORMAL HIGH (ref 3.87–5.11)
RDW: 15.2 % (ref 11.5–15.5)
WBC: 7.1 10*3/uL (ref 4.0–10.5)
nRBC: 0 % (ref 0.0–0.2)

## 2021-05-07 LAB — BASIC METABOLIC PANEL
Anion gap: 6 (ref 5–15)
BUN: 14 mg/dL (ref 6–20)
CO2: 26 mmol/L (ref 22–32)
Calcium: 8.4 mg/dL — ABNORMAL LOW (ref 8.9–10.3)
Chloride: 105 mmol/L (ref 98–111)
Creatinine, Ser: 0.63 mg/dL (ref 0.44–1.00)
GFR, Estimated: >60 mL/min (ref >60)
Glucose, Bld: 91 mg/dL (ref 70–99)
Potassium: 3.9 mmol/L (ref 3.5–5.1)
Sodium: 137 mmol/L (ref 135–145)

## 2021-05-07 LAB — MAGNESIUM: Magnesium: 2.2 mg/dL (ref 1.7–2.4)

## 2021-05-07 MED ORDER — LACTATED RINGERS IV BOLUS
1000.0000 mL | Freq: Once | INTRAVENOUS | Status: AC
  Administered 2021-05-07: 1000 mL via INTRAVENOUS

## 2021-05-07 MED ORDER — LORAZEPAM 2 MG/ML IJ SOLN
2.0000 mg | Freq: Once | INTRAMUSCULAR | Status: AC | PRN
  Administered 2021-05-07: 2 mg via INTRAVENOUS
  Filled 2021-05-07: qty 1

## 2021-05-07 MED ORDER — LEVETIRACETAM IN NACL 1000 MG/100ML IV SOLN
1000.0000 mg | Freq: Once | INTRAVENOUS | Status: AC
  Administered 2021-05-07: 1000 mg via INTRAVENOUS
  Filled 2021-05-07: qty 100

## 2021-05-07 NOTE — ED Provider Notes (Signed)
[facility] EMERGENCY DEPARTMENT Provider Note   CSN: [id number] Arrival date & time: 05/07/21  0105     History Chief Complaint  Patient presents with   Seizures    Katrina Good is a 50 y.o. female.   Seizures Seizure activity on arrival: no   Seizure type:  Grand mal Preceding symptoms: no headache, no numbness, no panic and no vision change   Initial focality:  Diffuse Episode characteristics: abnormal movements   Postictal symptoms: confusion and somnolence   Return to baseline: yes   Severity:  Mild Timing:  Once     Past Medical History:  Diagnosis Date   Acid reflux    Asthma    Bronchitis    Chronic heel pain, left    Diabetes mellitus    Hypertension    Seizures (HCC)    onset was 08/2020   Substance abuse (HCC)    pt denies   Suicidal thoughts    pt denies    Patient Active Problem List   Diagnosis Date Noted   Puncture wound of left foot with complication 05/03/2018   Calcaneus fracture, left 05/03/2018   Cocaine abuse (HCC) 05/03/2018   Cellulitis of left foot 05/02/2018   Hypokalemia    Colles' fracture of right radius 03/13/2012    Past Surgical History:  Procedure Laterality Date   ABDOMINAL HYSTERECTOMY     partial   CESAREAN SECTION     x 2     OB History     Gravida  5   Para  2   Term  2   Preterm      AB  3   Living  2      SAB  2   IAB  1   Ectopic      Multiple      Live Births              Family History  Problem Relation Age of Onset   Diabetes Mother    Hypertension Mother    Heart failure Mother    Seizures Father    Cancer Father    Hypertension Father    Heart failure Father    Asthma Father     Social History   Tobacco Use   Smoking status: Every Day    Packs/day: 2.00    Years: 33.00    Pack years: 66.00    Types: Cigarettes   Smokeless tobacco: Never   Tobacco comments:    2-3 packs  Vaping Use   Vaping Use: Never used  Substance Use Topics   Alcohol use: Not  Currently   Drug use: Yes    Types: Marijuana, "Crack" cocaine    Comment: 01/14/21 used yesterday- crack cocaine; uses crack (but not cocaine).  not MJ user    Home Medications Prior to Admission medications   Medication Sig Start Date End Date Taking? Authorizing Provider  acetaminophen (TYLENOL) 325 MG tablet Take 325 mg by mouth 2 (two) times daily as needed.    [provider]  albuterol (PROVENTIL HFA) 108 (90 Base) MCG/ACT inhaler INHALE 2 PUFFS BY MOUTH EVERY 6 HOURS AS NEEDED FOR COUGHING, WHEEZING, OR SHORTNESS OF BREATH 03/18/21   Jacquelin Hawking, PA-C  amLODipine (NORVASC) 5 MG tablet Take 1 tablet (5 mg total) by mouth daily. 03/18/21   Jacquelin Hawking, PA-C  aspirin EC 81 MG tablet Take 243 mg by mouth daily. Take 3 tabs daily (243 mg)    [provider]  diclofenac (VOLTAREN) 75 MG EC tablet Take 1 tablet (75 mg total) by mouth 2 (two) times daily as needed. 03/24/21   Jacquelin Hawking, PA-C  diphenhydrAMINE HCl (BENADRYL ALLERGY PO) Take 1 tablet by mouth in the morning and at bedtime. Patient not taking: Reported on 02/23/2021    [provider]  levETIRAcetam (KEPPRA) 500 MG tablet Take 1 tablet (500 mg total) by mouth 2 (two) times daily. 03/10/21   Sabas Sous, MD    Allergies    Doxycycline and Strawberry extract  Review of Systems   Review of Systems  Neurological:  Positive for seizures.  All other systems reviewed and are negative.  Physical Exam Updated Vital Signs BP (!) 184/95   Pulse 78   Temp 98.3 F (36.8 C) (Oral)   Resp 17   Ht 5\' 9"  (1.753 m)   Wt 90 kg   SpO2 98%   BMI 29.30 kg/m   Physical Exam Vitals and nursing note reviewed.  Constitutional:      Appearance: She is well-developed.  HENT:     Head: Normocephalic and atraumatic.     Mouth/Throat:     Mouth: Mucous membranes are moist.     Pharynx: Oropharynx is clear.  Eyes:     Pupils: Pupils are equal, round, and reactive to light.  Cardiovascular:      Rate and Rhythm: Normal rate and regular rhythm.  Pulmonary:     Effort: No respiratory distress.     Breath sounds: No stridor.  Abdominal:     General: Abdomen is flat. There is no distension.  Musculoskeletal:        General: No swelling or tenderness. Normal range of motion.     Cervical back: Normal range of motion.  Skin:    General: Skin is warm and dry.  Neurological:     General: No focal deficit present.     Mental Status: She is alert.    ED Results / Procedures / Treatments   Labs (all labs ordered are listed, but only abnormal results are displayed) Labs Reviewed  CBC WITH DIFFERENTIAL/PLATELET - Abnormal; Notable for the following components:      Result Value   RBC 5.22 (*)    All other components within normal limits  BASIC METABOLIC PANEL - Abnormal; Notable for the following components:   Calcium 8.4 (*)    All other components within normal limits  MAGNESIUM  LEVETIRACETAM LEVEL    EKG EKG Interpretation  Date/Time:  Thursday May 07 2021 01:15:44 EDT Ventricular Rate:  80 PR Interval:  220 QRS Duration: 95 QT Interval:  377 QTC Calculation: 435 R Axis:   82 Text Interpretation: Sinus rhythm Prolonged PR interval Confirmed by 02-13-2000 [phone]) on 05/07/2021 2:59:26 AM  Radiology No results found.  Procedures .Critical Care  Date/Time: 05/07/2021 6:22 AM Performed by: 07/08/2021, MD Authorized by: Marily Memos, MD   Critical care provider statement:    Critical care time (minutes):  45   Critical care was necessary to treat or prevent imminent or life-threatening deterioration of the following conditions:  CNS failure or compromise   Critical care was time spent personally by me on the following activities:  Discussions with consultants, evaluation of patient's response to treatment, examination of patient, ordering and performing treatments and interventions, ordering and review of laboratory studies, ordering and review of radiographic  studies, pulse oximetry, re-evaluation of patient's condition, obtaining history from patient or surrogate and review of old  charts   Medications Ordered in ED Medications  levETIRAcetam (KEPPRA) IVPB 1000 mg/100 mL premix (0 mg Intravenous Stopped 05/07/21 0215)  lactated ringers bolus 1,000 mL (0 mLs Intravenous Stopped 05/07/21 0317)  LORazepam (ATIVAN) injection 2 mg (2 mg Intravenous Given 05/07/21 0215)    ED Course  I have reviewed the triage vital signs and the nursing notes.  Pertinent labs & imaging results that were available during my care of the patient were reviewed by me and considered in my medical decision making (see chart for details).    MDM Rules/Calculators/A&P                          Seizure like activity prior to arrival. States she did some crack and drank some liquor and hasn't taken her keppra in a couple days. Will load for same and check a level. Fluids and check lytes. Has keppra at home that she picked up today.   Per nursing, had a seizure. Ativan given.   Reeval and still sleeping, VS WNL.   Reeval and patient is arousable, answers questions. Husband at bedside and states that she has been acting like herself but more sleepy. Doesn't have a way to get her home in this state. Will continue to observe until sober enough to walk or get a cab/uber ride.   Care transferred to Dr. Bernette Mayers pending reevaluation for MS improvement and likely discharge.   Final Clinical Impression(s) / ED Diagnoses Final diagnoses:  Seizure (HCC)  Polysubstance abuse [facility])    Rx / DC Orders ED Discharge Orders     None        Khyran Riera, Barbara Cower, MD 05/08/21 [phone]

## 2021-05-07 NOTE — ED Notes (Signed)
Pt has witnessed seizure at 2:07 and lasted 2 minutes. EDP made aware

## 2021-05-07 NOTE — ED Notes (Signed)
Pt ambulated poorly w/ 1 assist. C/o dizziness. Placed back on stretcher safely. Will provide meal tray and try to ambulate again.

## 2021-05-07 NOTE — ED Triage Notes (Addendum)
[facility]- had 4 witnessed seizure by family and 4 with EMS. Pt given versed PTA by EMS. Pt following commands and answering questions.

## 2021-05-12 LAB — LEVETIRACETAM LEVEL: Levetiracetam Lvl: 11.2 ug/mL (ref 10.0–40.0)

## 2021-05-13 ENCOUNTER — Telehealth: Payer: Self-pay | Admitting: Physician Assistant

## 2021-05-13 NOTE — Telephone Encounter (Signed)
Patient called in stating she was having chest pain on the right side, hurts when she takes a deep breath and this had been going on all night. After speaking with Carollee Herter, patient was advised to go to ER to rule out possible heart attack due to her age and drug use.

## 2021-05-25 ENCOUNTER — Other Ambulatory Visit: Payer: Self-pay

## 2021-05-25 ENCOUNTER — Encounter: Payer: Self-pay | Admitting: Physician Assistant

## 2021-05-25 ENCOUNTER — Ambulatory Visit: Payer: Self-pay | Admitting: Physician Assistant

## 2021-05-25 VITALS — BP 119/90 | HR 80 | Temp 97.3°F | Wt 199.0 lb

## 2021-05-25 DIAGNOSIS — F1910 Other psychoactive substance abuse, uncomplicated: Secondary | ICD-10-CM

## 2021-05-25 DIAGNOSIS — F17200 Nicotine dependence, unspecified, uncomplicated: Secondary | ICD-10-CM

## 2021-05-25 DIAGNOSIS — R32 Unspecified urinary incontinence: Secondary | ICD-10-CM

## 2021-05-25 DIAGNOSIS — R159 Full incontinence of feces: Secondary | ICD-10-CM

## 2021-05-25 DIAGNOSIS — I10 Essential (primary) hypertension: Secondary | ICD-10-CM

## 2021-05-25 DIAGNOSIS — G40909 Epilepsy, unspecified, not intractable, without status epilepticus: Secondary | ICD-10-CM

## 2021-05-25 LAB — POCT URINALYSIS DIPSTICK
Bilirubin, UA: NEGATIVE
Glucose, UA: NEGATIVE
Ketones, UA: NEGATIVE
Leukocytes, UA: NEGATIVE
Nitrite, UA: NEGATIVE
Protein, UA: NEGATIVE
Spec Grav, UA: >=1.03 — AB (ref 1.010–1.025)
Urobilinogen, UA: 1 E.U./dL
pH, UA: 5.5 (ref 5.0–8.0)

## 2021-05-25 NOTE — Progress Notes (Signed)
BP 119/90   Pulse 80   Temp (!) 97.3 F (36.3 C)   Wt 199 lb (90.3 kg)   SpO2 91%   BMI 29.39 kg/m    Subjective:    Patient ID: Katrina Good, female    DOB: Jan 07, 1971, 50 y.o.   MRN: [id number]  HPI: [name] is a 50 y.o. female presenting on 05/25/2021 for Hypertension   HPI  Pt had a negative covid 19 screening questionnaire.     Chief Complaint  Patient presents with   Hypertension     Pt says her knee is still bothering her.  But then says it's her legs, both legs, that bother her.  She Says she feels like food gets stuck in her throat.   She is drinking vinegar to try to make it go away.   She sees Nephrology for ckd;  she says she has follow up there in November.  She sees Neurology for  seizures.     Pt seen in ER 05/07/21 -   for seizure.  Per ER notes, She Was out of keppra and used crack and etoh.   Pt says she is urinating on herself because she can't tell when she it going to void.    She says that has beeen gong on about 4 or 5 months.  She says she can never tell when she is going to void.  She says she can't tell when she is going to have a BM either and defecates in her pants.   She is not having any dysuria or blood in stool.       Relevant past medical, surgical, family and social history reviewed and updated as indicated. Interim medical history since our last visit reviewed. Allergies and medications reviewed and updated.   Current Outpatient Medications:    albuterol (PROVENTIL HFA) 108 (90 Base) MCG/ACT inhaler, INHALE 2 PUFFS BY MOUTH EVERY 6 HOURS AS NEEDED FOR COUGHING, WHEEZING, OR SHORTNESS OF BREATH, Disp: 20.1 g, Rfl: 0   amLODipine (NORVASC) 5 MG tablet, Take 1 tablet (5 mg total) by mouth daily., Disp: 90 tablet, Rfl: 0   aspirin EC 81 MG tablet, Take 243 mg by mouth daily. Take 3 tabs daily (243 mg), Disp: , Rfl:    diclofenac (VOLTAREN) 75 MG EC tablet, Take 1 tablet (75 mg total) by mouth 2 (two) times daily as  needed., Disp: 60 tablet, Rfl: 0   levETIRAcetam (KEPPRA) 500 MG tablet, Take 1 tablet (500 mg total) by mouth 2 (two) times daily., Disp: 60 tablet, Rfl: 1     Review of Systems  Per HPI unless specifically indicated above     Objective:    BP 119/90   Pulse 80   Temp (!) 97.3 F (36.3 C)   Wt 199 lb (90.3 kg)   SpO2 91%   BMI 29.39 kg/m   Wt Readings from Last 3 Encounters:  05/25/21 199 lb (90.3 kg)  05/07/21 198 lb 6.6 oz (90 kg)  03/10/21 198 lb 6.6 oz (90 kg)    Physical Exam Vitals reviewed.  Constitutional:      General: She is not in acute distress.    Appearance: She is well-developed. She is not toxic-appearing.  HENT:     Head: Normocephalic and atraumatic.  Cardiovascular:     Rate and Rhythm: Normal rate and regular rhythm.  Pulmonary:     Effort: Pulmonary effort is normal.     Breath sounds: Normal breath sounds.  Abdominal:     General: Bowel sounds are normal.     Palpations: Abdomen is soft. There is no mass.     Tenderness: There is no abdominal tenderness. There is no right CVA tenderness or left CVA tenderness.  Musculoskeletal:     Cervical back: Neck supple.     Right lower leg: No edema.     Left lower leg: No edema.  Lymphadenopathy:     Cervical: No cervical adenopathy.  Skin:    General: Skin is warm and dry.  Neurological:     Mental Status: She is alert and oriented to person, place, and time.  Psychiatric:        Behavior: Behavior normal.      UA unremarkable FIT test 03/09/21 normal       Assessment & Plan:     Encounter Diagnoses  Name Primary?   Primary hypertension Yes   Bowel and bladder incontinence    Urinary incontinence, unspecified type    Seizure disorder (HCC)    Substance abuse (HCC)    Tobacco use disorder      -BP good.  Continue amlodipine -will refer to her neurologist for evaluation of incontinence of bowel and bladder -encouraged pt to go to [facility] for help with substance issues -pt is  counseled to avoid running out of her keppra to help reduce seizures.  She says she is not driving. -pt to follow up 3 months.  She is to contact office sooner prn

## 2021-06-03 ENCOUNTER — Telehealth: Payer: Self-pay

## 2021-06-03 NOTE — Telephone Encounter (Signed)
Called pt to inform of Neurology trying to reach her for appt, left vm for pt to call back.

## 2021-06-10 ENCOUNTER — Other Ambulatory Visit: Payer: Self-pay | Admitting: Physician Assistant

## 2021-07-18 ENCOUNTER — Emergency Department (HOSPITAL_COMMUNITY): Admission: EM | Admit: 2021-07-18 | Discharge: 2021-07-18 | Discharge status: Against Medical Advice | Payer: Self-pay

## 2021-07-18 ENCOUNTER — Other Ambulatory Visit: Payer: Self-pay

## 2021-07-19 ENCOUNTER — Inpatient Hospital Stay (HOSPITAL_COMMUNITY)
Admission: EM | Admit: 2021-07-19 | Discharge: 2021-07-21 | DRG: 871 | Discharge status: Against Medical Advice | Payer: Self-pay | Attending: Internal Medicine | Admitting: Internal Medicine

## 2021-07-19 ENCOUNTER — Encounter (HOSPITAL_COMMUNITY): Payer: Self-pay

## 2021-07-19 ENCOUNTER — Other Ambulatory Visit: Payer: Self-pay

## 2021-07-19 ENCOUNTER — Emergency Department (HOSPITAL_COMMUNITY): Payer: Self-pay

## 2021-07-19 DIAGNOSIS — R509 Fever, unspecified: Secondary | ICD-10-CM

## 2021-07-19 DIAGNOSIS — J9691 Respiratory failure, unspecified with hypoxia: Secondary | ICD-10-CM | POA: Diagnosis present

## 2021-07-19 DIAGNOSIS — Z90710 Acquired absence of both cervix and uterus: Secondary | ICD-10-CM

## 2021-07-19 DIAGNOSIS — Z881 Allergy status to other antibiotic agents status: Secondary | ICD-10-CM

## 2021-07-19 DIAGNOSIS — Z91018 Allergy to other foods: Secondary | ICD-10-CM

## 2021-07-19 DIAGNOSIS — Z825 Family history of asthma and other chronic lower respiratory diseases: Secondary | ICD-10-CM

## 2021-07-19 DIAGNOSIS — G9341 Metabolic encephalopathy: Secondary | ICD-10-CM | POA: Diagnosis present

## 2021-07-19 DIAGNOSIS — A419 Sepsis, unspecified organism: Principal | ICD-10-CM | POA: Diagnosis present

## 2021-07-19 DIAGNOSIS — Z8249 Family history of ischemic heart disease and other diseases of the circulatory system: Secondary | ICD-10-CM

## 2021-07-19 DIAGNOSIS — R52 Pain, unspecified: Secondary | ICD-10-CM

## 2021-07-19 DIAGNOSIS — Z79899 Other long term (current) drug therapy: Secondary | ICD-10-CM

## 2021-07-19 DIAGNOSIS — Z20822 Contact with and (suspected) exposure to covid-19: Secondary | ICD-10-CM | POA: Diagnosis present

## 2021-07-19 DIAGNOSIS — J45909 Unspecified asthma, uncomplicated: Secondary | ICD-10-CM | POA: Diagnosis present

## 2021-07-19 DIAGNOSIS — F17210 Nicotine dependence, cigarettes, uncomplicated: Secondary | ICD-10-CM | POA: Diagnosis present

## 2021-07-19 DIAGNOSIS — F1423 Cocaine dependence with withdrawal: Secondary | ICD-10-CM | POA: Diagnosis present

## 2021-07-19 DIAGNOSIS — J154 Pneumonia due to other streptococci: Secondary | ICD-10-CM | POA: Diagnosis present

## 2021-07-19 DIAGNOSIS — J189 Pneumonia, unspecified organism: Secondary | ICD-10-CM

## 2021-07-19 DIAGNOSIS — E119 Type 2 diabetes mellitus without complications: Secondary | ICD-10-CM | POA: Diagnosis present

## 2021-07-19 DIAGNOSIS — I10 Essential (primary) hypertension: Secondary | ICD-10-CM | POA: Diagnosis present

## 2021-07-19 DIAGNOSIS — Z7982 Long term (current) use of aspirin: Secondary | ICD-10-CM

## 2021-07-19 DIAGNOSIS — K219 Gastro-esophageal reflux disease without esophagitis: Secondary | ICD-10-CM | POA: Diagnosis present

## 2021-07-19 DIAGNOSIS — G40909 Epilepsy, unspecified, not intractable, without status epilepticus: Secondary | ICD-10-CM | POA: Diagnosis present

## 2021-07-19 DIAGNOSIS — Z833 Family history of diabetes mellitus: Secondary | ICD-10-CM

## 2021-07-19 LAB — CBC WITH DIFFERENTIAL/PLATELET
Abs Immature Granulocytes: 0.07 10*3/uL (ref 0.00–0.07)
Basophils Absolute: 0.1 10*3/uL (ref 0.0–0.1)
Basophils Relative: 1 %
Eosinophils Absolute: 0 10*3/uL (ref 0.0–0.5)
Eosinophils Relative: 0 %
HCT: 47 % — ABNORMAL HIGH (ref 36.0–46.0)
Hemoglobin: 15.4 g/dL — ABNORMAL HIGH (ref 12.0–15.0)
Immature Granulocytes: 1 %
Lymphocytes Relative: 11 %
Lymphs Abs: 1.5 10*3/uL (ref 0.7–4.0)
MCH: 27.1 pg (ref 26.0–34.0)
MCHC: 32.8 g/dL (ref 30.0–36.0)
MCV: 82.6 fL (ref 80.0–100.0)
Monocytes Absolute: 1.5 10*3/uL — ABNORMAL HIGH (ref 0.1–1.0)
Monocytes Relative: 11 %
Neutro Abs: 10 10*3/uL — ABNORMAL HIGH (ref 1.7–7.7)
Neutrophils Relative %: 76 %
Platelets: 149 10*3/uL — ABNORMAL LOW (ref 150–400)
RBC: 5.69 MIL/uL — ABNORMAL HIGH (ref 3.87–5.11)
RDW: 15.7 % — ABNORMAL HIGH (ref 11.5–15.5)
WBC: 13.1 10*3/uL — ABNORMAL HIGH (ref 4.0–10.5)
nRBC: 0 % (ref 0.0–0.2)

## 2021-07-19 LAB — URINALYSIS, ROUTINE W REFLEX MICROSCOPIC
Bilirubin Urine: NEGATIVE
Glucose, UA: NEGATIVE mg/dL
Ketones, ur: 20 mg/dL — AB
Leukocytes,Ua: NEGATIVE
Nitrite: NEGATIVE
Protein, ur: 100 mg/dL — AB
Specific Gravity, Urine: 1.035 — ABNORMAL HIGH (ref 1.005–1.030)
pH: 5 (ref 5.0–8.0)

## 2021-07-19 LAB — COMPREHENSIVE METABOLIC PANEL
ALT: 17 U/L (ref 0–44)
AST: 14 U/L — ABNORMAL LOW (ref 15–41)
Albumin: 3.5 g/dL (ref 3.5–5.0)
Alkaline Phosphatase: 101 U/L (ref 38–126)
Anion gap: 8 (ref 5–15)
BUN: 12 mg/dL (ref 6–20)
CO2: 24 mmol/L (ref 22–32)
Calcium: 8 mg/dL — ABNORMAL LOW (ref 8.9–10.3)
Chloride: 98 mmol/L (ref 98–111)
Creatinine, Ser: 0.89 mg/dL (ref 0.44–1.00)
GFR, Estimated: >60 mL/min (ref >60)
Glucose, Bld: 151 mg/dL — ABNORMAL HIGH (ref 70–99)
Potassium: 3.4 mmol/L — ABNORMAL LOW (ref 3.5–5.1)
Sodium: 130 mmol/L — ABNORMAL LOW (ref 135–145)
Total Bilirubin: 1.1 mg/dL (ref 0.3–1.2)
Total Protein: 7.8 g/dL (ref 6.5–8.1)

## 2021-07-19 LAB — PROTIME-INR
INR: 1.2 (ref 0.8–1.2)
Prothrombin Time: 15.3 seconds — ABNORMAL HIGH (ref 11.4–15.2)

## 2021-07-19 LAB — RESP PANEL BY RT-PCR (FLU A&B, COVID) ARPGX2
Influenza A by PCR: NEGATIVE
Influenza B by PCR: NEGATIVE
SARS Coronavirus 2 by RT PCR: NEGATIVE

## 2021-07-19 LAB — LACTIC ACID, PLASMA
Lactic Acid, Venous: 0.9 mmol/L (ref 0.5–1.9)
Lactic Acid, Venous: 1.2 mmol/L (ref 0.5–1.9)

## 2021-07-19 MED ORDER — LACTATED RINGERS IV BOLUS (SEPSIS)
1000.0000 mL | Freq: Once | INTRAVENOUS | Status: AC
  Administered 2021-07-19: 1000 mL via INTRAVENOUS

## 2021-07-19 MED ORDER — SODIUM CHLORIDE 0.9 % IV SOLN
500.0000 mg | INTRAVENOUS | Status: DC
  Administered 2021-07-19 – 2021-07-20 (×2): 500 mg via INTRAVENOUS
  Filled 2021-07-19 (×2): qty 500

## 2021-07-19 MED ORDER — ACETAMINOPHEN 500 MG PO TABS
1000.0000 mg | ORAL_TABLET | Freq: Once | ORAL | Status: AC
  Administered 2021-07-19: 1000 mg via ORAL
  Filled 2021-07-19: qty 2

## 2021-07-19 MED ORDER — LACTATED RINGERS IV SOLN: INTRAVENOUS | Status: DC

## 2021-07-19 MED ORDER — SODIUM CHLORIDE 0.9 % IV SOLN
2.0000 g | INTRAVENOUS | Status: DC
  Administered 2021-07-19 – 2021-07-20 (×2): 2 g via INTRAVENOUS
  Filled 2021-07-19 (×2): qty 20

## 2021-07-19 MED ORDER — IBUPROFEN 800 MG PO TABS
800.0000 mg | ORAL_TABLET | Freq: Once | ORAL | Status: AC
  Administered 2021-07-19: 800 mg via ORAL
  Filled 2021-07-19: qty 1

## 2021-07-19 NOTE — ED Provider Notes (Signed)
[facility] EMERGENCY DEPARTMENT Provider Note   CSN: [id number] Arrival date & time: 07/19/21  1927     History Chief Complaint  Patient presents with   Fever    Cbg 176, hx of seizures, came yesterday but left    Katrina Good is a 50 y.o. female.  HPI  50 year old female presents emergency department with concern for weakness, productive cough, fever and chills.  EMS reported being called out for possible seizure.  Patient is unclear about what happened.  States that she laid down on the ground to take a nap and then woke up with EMS standing over her.  No family at bedside to correlate this.  Currently complaining of mild shortness of breath and fatigue.  Denies any GI symptoms.  Past Medical History:  Diagnosis Date   Acid reflux    Asthma    Bronchitis    Chronic heel pain, left    Diabetes mellitus    Hypertension    Seizures (HCC)    onset was 08/2020   Substance abuse (HCC)    pt denies   Suicidal thoughts    pt denies    Patient Active Problem List   Diagnosis Date Noted   Puncture wound of left foot with complication 05/03/2018   Calcaneus fracture, left 05/03/2018   Cocaine abuse (HCC) 05/03/2018   Cellulitis of left foot 05/02/2018   Hypokalemia    Colles' fracture of right radius 03/13/2012    Past Surgical History:  Procedure Laterality Date   ABDOMINAL HYSTERECTOMY     partial   CESAREAN SECTION     x 2     OB History     Gravida  5   Para  2   Term  2   Preterm      AB  3   Living  2      SAB  2   IAB  1   Ectopic      Multiple      Live Births              Family History  Problem Relation Age of Onset   Diabetes Mother    Hypertension Mother    Heart failure Mother    Seizures Father    Cancer Father    Hypertension Father    Heart failure Father    Asthma Father     Social History   Tobacco Use   Smoking status: Every Day    Packs/day: 2.00    Years: 33.00    Pack years: 66.00    Types:  Cigarettes   Smokeless tobacco: Never   Tobacco comments:    2-3 packs  Vaping Use   Vaping Use: Never used  Substance Use Topics   Alcohol use: Not Currently   Drug use: Yes    Types: Marijuana, "Crack" cocaine    Comment: 9/15/20222 used yesterday- crack cocaine; uses crack (but not cocaine).  not MJ user    Home Medications Prior to Admission medications   Medication Sig Start Date End Date Taking? Authorizing Provider  albuterol (PROVENTIL HFA) 108 (90 Base) MCG/ACT inhaler INHALE 2 PUFFS BY MOUTH EVERY 6 HOURS AS NEEDED FOR COUGHING, WHEEZING, OR SHORTNESS OF BREATH 06/11/21   Jacquelin Hawking, PA-C  amLODipine (NORVASC) 5 MG tablet TAKE 1 Tablet BY MOUTH ONCE EVERY DAY 06/11/21   Jacquelin Hawking, PA-C  aspirin EC 81 MG tablet Take 243 mg by mouth daily. Take 3 tabs daily (243 mg)  [provider]  diclofenac (VOLTAREN) 75 MG EC tablet Take 1 tablet (75 mg total) by mouth 2 (two) times daily as needed. 03/24/21   Jacquelin Hawking, PA-C  levETIRAcetam (KEPPRA) 500 MG tablet Take 1 tablet (500 mg total) by mouth 2 (two) times daily. 03/10/21   Sabas Sous, MD    Allergies    Doxycycline and Strawberry extract  Review of Systems   Review of Systems  Constitutional:  Positive for chills, fatigue and fever.  HENT:  Negative for congestion.   Eyes:  Negative for visual disturbance.  Respiratory:  Positive for cough and shortness of breath.   Cardiovascular:  Negative for chest pain and leg swelling.  Gastrointestinal:  Negative for abdominal pain, diarrhea and vomiting.  Genitourinary:  Negative for dysuria.  Skin:  Negative for rash.  Neurological:  Negative for headaches.   Physical Exam Updated Vital Signs BP (!) 134/94   Pulse 94   Temp (!) 102.1 F (38.9 C)   Resp 19   Ht 5\' 7"  (1.702 m)   Wt 90.7 kg   SpO2 99%   BMI 31.32 kg/m   Physical Exam Vitals and nursing note reviewed.  Constitutional:      Appearance: Normal appearance. She is  ill-appearing.  HENT:     Head: Normocephalic.     Mouth/Throat:     Mouth: Mucous membranes are moist.  Cardiovascular:     Rate and Rhythm: Normal rate.  Pulmonary:     Effort: Pulmonary effort is normal. No respiratory distress.     Breath sounds: Rales present.  Abdominal:     Palpations: Abdomen is soft.     Tenderness: There is no abdominal tenderness.  Musculoskeletal:        General: No swelling.  Skin:    General: Skin is warm.  Neurological:     Mental Status: She is alert and oriented to person, place, and time. Mental status is at baseline.  Psychiatric:        Mood and Affect: Mood normal.    ED Results / Procedures / Treatments   Labs (all labs ordered are listed, but only abnormal results are displayed) Labs Reviewed  COMPREHENSIVE METABOLIC PANEL - Abnormal; Notable for the following components:      Result Value   Sodium 130 (*)    Potassium 3.4 (*)    Glucose, Bld 151 (*)    Calcium 8.0 (*)    AST 14 (*)    All other components within normal limits  CBC WITH DIFFERENTIAL/PLATELET - Abnormal; Notable for the following components:   WBC 13.1 (*)    RBC 5.69 (*)    Hemoglobin 15.4 (*)    HCT 47.0 (*)    RDW 15.7 (*)    Platelets 149 (*)    Neutro Abs 10.0 (*)    Monocytes Absolute 1.5 (*)    All other components within normal limits  PROTIME-INR - Abnormal; Notable for the following components:   Prothrombin Time 15.3 (*)    All other components within normal limits  URINALYSIS, ROUTINE W REFLEX MICROSCOPIC - Abnormal; Notable for the following components:   Color, Urine AMBER (*)    APPearance HAZY (*)    Specific Gravity, Urine 1.035 (*)    Hgb urine dipstick MODERATE (*)    Ketones, ur 20 (*)    Protein, ur 100 (*)    Bacteria, UA RARE (*)    All other components within normal limits  RESP PANEL BY  RT-PCR (FLU A&B, COVID) ARPGX2  CULTURE, BLOOD (ROUTINE X 2)  CULTURE, BLOOD (ROUTINE X 2)  LACTIC ACID, PLASMA  LACTIC ACID, PLASMA     EKG EKG Interpretation  Date/Time:  "Sunday July 19 2021 19:37:47 EDT Ventricular Rate:  100 PR Interval:  155 QRS Duration: 95 QT Interval:  337 QTC Calculation: 435 R Axis:   61 Text Interpretation: Sinus tachycardia Left atrial enlargement Baseline wander in lead(s) I II aVR Sinus tachycardia, wandering baseline Confirmed by Daniela Siebers ([id number]) on 07/19/2021 8:27:24 PM  Radiology DG Chest Port 1 View  Result Date: 07/19/2021 CLINICAL DATA:  Possible sepsis. EXAM: PORTABLE CHEST 1 VIEW COMPARISON:  CT of the chest 01/05/2021.  Chest x-ray 08/28/2020. FINDINGS: The heart is upper limits of normal in size. Mediastinal silhouette is within normal limits. There are linear bands of atelectasis in the left mid lung. There is a fate rounded density projecting over the right lower lung 6.7 cm. There is a small right pleural effusion. There is no pneumothorax. No acute fractures are seen. IMPRESSION: 1. Rounded density projecting over the right lower lung worrisome for focal parenchymal process such as infection or mass. Consider CT. 2. Bilateral atelectasis. Electronically Signed   By: Amy  Guttmann M.D.   On: 07/19/2021 20:21    Procedures .Critical Care Performed by: Danis Pembleton M, DO Authorized by: Zaliah Wissner M, DO   Critical care provider statement:    Critical care time (minutes):  45   Critical care time was exclusive of:  Separately billable procedures and treating other patients   Critical care was necessary to treat or prevent imminent or life-threatening deterioration of the following conditions:  Sepsis   Critical care was time spent personally by me on the following activities:  Discussions with consultants, evaluation of patient's response to treatment, examination of patient, ordering and performing treatments and interventions, ordering and review of laboratory studies, ordering and review of radiographic studies, pulse oximetry, re-evaluation of patient's  condition, obtaining history from patient or surrogate and review of old charts   Medications Ordered in ED Medications  lactated ringers infusion (has no administration in time range)  cefTRIAXone (ROCEPHIN) 2 g in sodium chloride 0.9 % 100 mL IVPB (2 g Intravenous New Bag/Given 07/19/21 2212)  azithromycin (ZITHROMAX) 500 mg in sodium chloride 0.9 % 250 mL IVPB (500 mg Intravenous New Bag/Given 07/19/21 2211)  acetaminophen (TYLENOL) tablet 1,000 mg (1,000 mg Oral Given 07/19/21 2055)  lactated ringers bolus 1,000 mL (1,000 mLs Intravenous New Bag/Given 07/19/21 2210)    And  lactated ringers bolus 1,000 mL (1,000 mLs Intravenous New Bag/Given 07/19/21 2212)    And  lactated ringers bolus 1,000 mL (1,000 mLs Intravenous New Bag/Given 07/19/21 2211)  ibuprofen (ADVIL) tablet 800 mg (800 mg Oral Given 07/19/21 2223)    ED Course  I have reviewed the triage vital signs and the nursing notes.  Pertinent labs & imaging results that were available during my care of the patient were reviewed by me and considered in my medical decision making (see chart for details).    MDM Rules/Calculators/A&P                           50"  year old female presents emergency department for productive cough and weakness.  Febrile on arrival.  Tachycardic.  Blood work shows a leukocytosis with a left shift.  Mild hyponatremia.  Chest x-ray shows a right lower lobe pneumonia.  Flu and COVID are  negative.  Urinalysis is unremarkable.  Sepsis criteria satisfied, patient treated per sepsis protocol with IV fluids and antibiotic.  Patient is currently on 2 L nasal cannula for comfort but has never been hypoxic.  Blood pressure is stable.  Patients evaluation and results requires admission for further treatment and care. Patient agrees with admission plan, offers no new complaints and is stable/unchanged at time of admit.  Final Clinical Impression(s) / ED Diagnoses Final diagnoses:  Community acquired pneumonia of right  lower lobe of lung  Sepsis, due to unspecified organism, unspecified whether acute organ dysfunction present [facility])    Rx / DC Orders ED Discharge Orders     None        Rozelle Logan, DO 07/19/21 2307

## 2021-07-19 NOTE — ED Notes (Signed)
Pt here via ems with reports of being called out for possible seizure and found pt on ground, initially unable to follow instructions, but was more responsive to boyfriend. Pt found to be febrile. Pt in room, placed on cardiac monitor, EKG obtained, pt tachy, temp rectally 101.5. pt moans in response to questions. Sepsis workup initiated.

## 2021-07-19 NOTE — Progress Notes (Signed)
Pt being followed by [facility] for Sepsis protocol. 

## 2021-07-20 ENCOUNTER — Telehealth: Payer: Self-pay | Admitting: Diagnostic Neuroimaging

## 2021-07-20 ENCOUNTER — Other Ambulatory Visit: Payer: Self-pay

## 2021-07-20 ENCOUNTER — Institutional Professional Consult (permissible substitution): Payer: Self-pay | Admitting: Diagnostic Neuroimaging

## 2021-07-20 ENCOUNTER — Encounter (HOSPITAL_COMMUNITY): Payer: Self-pay | Admitting: Family Medicine

## 2021-07-20 ENCOUNTER — Inpatient Hospital Stay (HOSPITAL_COMMUNITY): Payer: Self-pay

## 2021-07-20 DIAGNOSIS — J9601 Acute respiratory failure with hypoxia: Secondary | ICD-10-CM

## 2021-07-20 DIAGNOSIS — R918 Other nonspecific abnormal finding of lung field: Secondary | ICD-10-CM

## 2021-07-20 DIAGNOSIS — J189 Pneumonia, unspecified organism: Secondary | ICD-10-CM

## 2021-07-20 DIAGNOSIS — A419 Sepsis, unspecified organism: Principal | ICD-10-CM

## 2021-07-20 DIAGNOSIS — R1013 Epigastric pain: Secondary | ICD-10-CM

## 2021-07-20 DIAGNOSIS — R6520 Severe sepsis without septic shock: Secondary | ICD-10-CM

## 2021-07-20 LAB — COMPREHENSIVE METABOLIC PANEL
ALT: 15 U/L (ref 0–44)
AST: 14 U/L — ABNORMAL LOW (ref 15–41)
Albumin: 3 g/dL — ABNORMAL LOW (ref 3.5–5.0)
Alkaline Phosphatase: 89 U/L (ref 38–126)
Anion gap: 6 (ref 5–15)
BUN: 9 mg/dL (ref 6–20)
CO2: 27 mmol/L (ref 22–32)
Calcium: 8.2 mg/dL — ABNORMAL LOW (ref 8.9–10.3)
Chloride: 103 mmol/L (ref 98–111)
Creatinine, Ser: 0.85 mg/dL (ref 0.44–1.00)
GFR, Estimated: >60 mL/min (ref >60)
Glucose, Bld: 125 mg/dL — ABNORMAL HIGH (ref 70–99)
Potassium: 3.6 mmol/L (ref 3.5–5.1)
Sodium: 136 mmol/L (ref 135–145)
Total Bilirubin: 0.6 mg/dL (ref 0.3–1.2)
Total Protein: 7.2 g/dL (ref 6.5–8.1)

## 2021-07-20 LAB — CBC WITH DIFFERENTIAL/PLATELET
Abs Immature Granulocytes: 0.08 10*3/uL — ABNORMAL HIGH (ref 0.00–0.07)
Basophils Absolute: 0 10*3/uL (ref 0.0–0.1)
Basophils Relative: 0 %
Eosinophils Absolute: 0.1 10*3/uL (ref 0.0–0.5)
Eosinophils Relative: 1 %
HCT: 43.6 % (ref 36.0–46.0)
Hemoglobin: 14.3 g/dL (ref 12.0–15.0)
Immature Granulocytes: 1 %
Lymphocytes Relative: 11 %
Lymphs Abs: 1.3 10*3/uL (ref 0.7–4.0)
MCH: 27.6 pg (ref 26.0–34.0)
MCHC: 32.8 g/dL (ref 30.0–36.0)
MCV: 84 fL (ref 80.0–100.0)
Monocytes Absolute: 0.9 10*3/uL (ref 0.1–1.0)
Monocytes Relative: 8 %
Neutro Abs: 9.4 10*3/uL — ABNORMAL HIGH (ref 1.7–7.7)
Neutrophils Relative %: 79 %
Platelets: 145 10*3/uL — ABNORMAL LOW (ref 150–400)
RBC: 5.19 MIL/uL — ABNORMAL HIGH (ref 3.87–5.11)
RDW: 15.4 % (ref 11.5–15.5)
WBC: 11.8 10*3/uL — ABNORMAL HIGH (ref 4.0–10.5)
nRBC: 0 % (ref 0.0–0.2)

## 2021-07-20 LAB — BLOOD GAS, VENOUS
Acid-Base Excess: 2.6 mmol/L — ABNORMAL HIGH (ref 0.0–2.0)
Bicarbonate: 24.9 mmol/L (ref 20.0–28.0)
FIO2: 21
O2 Saturation: 40 %
Patient temperature: 38.1
pCO2, Ven: 47.5 mmHg (ref 44.0–60.0)
pH, Ven: 7.38 (ref 7.250–7.430)
pO2, Ven: <31 mmHg — CL (ref 32.0–45.0)

## 2021-07-20 LAB — LIPASE, BLOOD: Lipase: 35 U/L (ref 11–51)

## 2021-07-20 LAB — ETHANOL: Alcohol, Ethyl (B): <10 mg/dL (ref <10)

## 2021-07-20 LAB — PROCALCITONIN: Procalcitonin: <0.1 ng/mL

## 2021-07-20 LAB — HEMOGLOBIN A1C
Hgb A1c MFr Bld: 6 % — ABNORMAL HIGH (ref 4.8–5.6)
Mean Plasma Glucose: 125.5 mg/dL

## 2021-07-20 LAB — GLUCOSE, CAPILLARY
Glucose-Capillary: 134 mg/dL — ABNORMAL HIGH (ref 70–99)
Glucose-Capillary: 115 mg/dL — ABNORMAL HIGH (ref 70–99)
Glucose-Capillary: 126 mg/dL — ABNORMAL HIGH (ref 70–99)
Glucose-Capillary: 106 mg/dL — ABNORMAL HIGH (ref 70–99)

## 2021-07-20 LAB — CORTISOL-AM, BLOOD: Cortisol - AM: 25.8 ug/dL — ABNORMAL HIGH (ref 6.7–22.6)

## 2021-07-20 LAB — TSH: TSH: 1.332 u[IU]/mL (ref 0.350–4.500)

## 2021-07-20 LAB — HIV ANTIBODY (ROUTINE TESTING W REFLEX): HIV Screen 4th Generation wRfx: NONREACTIVE

## 2021-07-20 LAB — PROTIME-INR
INR: 1.2 (ref 0.8–1.2)
Prothrombin Time: 15.5 seconds — ABNORMAL HIGH (ref 11.4–15.2)

## 2021-07-20 LAB — AMMONIA: Ammonia: 22 umol/L (ref 9–35)

## 2021-07-20 LAB — MAGNESIUM: Magnesium: 2.1 mg/dL (ref 1.7–2.4)

## 2021-07-20 MED ORDER — IPRATROPIUM-ALBUTEROL 0.5-2.5 (3) MG/3ML IN SOLN
3.0000 mL | Freq: Four times a day (QID) | RESPIRATORY_TRACT | Status: DC
  Administered 2021-07-20: 3 mL via RESPIRATORY_TRACT
  Filled 2021-07-20: qty 3

## 2021-07-20 MED ORDER — LEVETIRACETAM 500 MG PO TABS
500.0000 mg | ORAL_TABLET | Freq: Two times a day (BID) | ORAL | Status: DC
  Administered 2021-07-20 – 2021-07-21 (×4): 500 mg via ORAL
  Filled 2021-07-20 (×4): qty 1

## 2021-07-20 MED ORDER — PANTOPRAZOLE SODIUM 40 MG PO TBEC
40.0000 mg | DELAYED_RELEASE_TABLET | Freq: Two times a day (BID) | ORAL | Status: DC
  Administered 2021-07-20 – 2021-07-21 (×2): 40 mg via ORAL
  Filled 2021-07-20 (×2): qty 1

## 2021-07-20 MED ORDER — ONDANSETRON HCL 4 MG PO TABS: 4.0000 mg | ORAL_TABLET | Freq: Four times a day (QID) | ORAL | Status: DC | PRN

## 2021-07-20 MED ORDER — SENNOSIDES-DOCUSATE SODIUM 8.6-50 MG PO TABS: 1.0000 | ORAL_TABLET | Freq: Every evening | ORAL | Status: DC | PRN

## 2021-07-20 MED ORDER — INSULIN ASPART 100 UNIT/ML IJ SOLN: 0.0000 [IU] | Freq: Every day | INTRAMUSCULAR | Status: DC

## 2021-07-20 MED ORDER — ASPIRIN EC 81 MG PO TBEC
243.0000 mg | DELAYED_RELEASE_TABLET | Freq: Every day | ORAL | Status: DC
  Administered 2021-07-20 – 2021-07-21 (×2): 243 mg via ORAL
  Filled 2021-07-20 (×2): qty 3

## 2021-07-20 MED ORDER — HYDRALAZINE HCL 20 MG/ML IJ SOLN: 10.0000 mg | INTRAMUSCULAR | Status: DC | PRN

## 2021-07-20 MED ORDER — AMLODIPINE BESYLATE 5 MG PO TABS
5.0000 mg | ORAL_TABLET | Freq: Every day | ORAL | Status: DC
  Administered 2021-07-20 – 2021-07-21 (×2): 5 mg via ORAL
  Filled 2021-07-20 (×2): qty 1

## 2021-07-20 MED ORDER — PANTOPRAZOLE SODIUM 40 MG PO TBEC
40.0000 mg | DELAYED_RELEASE_TABLET | Freq: Every day | ORAL | Status: DC
  Administered 2021-07-20: 40 mg via ORAL

## 2021-07-20 MED ORDER — TRAZODONE HCL 50 MG PO TABS
50.0000 mg | ORAL_TABLET | Freq: Every evening | ORAL | Status: DC | PRN
  Administered 2021-07-20: 50 mg via ORAL
  Filled 2021-07-20: qty 1

## 2021-07-20 MED ORDER — IPRATROPIUM-ALBUTEROL 0.5-2.5 (3) MG/3ML IN SOLN
3.0000 mL | Freq: Two times a day (BID) | RESPIRATORY_TRACT | Status: DC
  Administered 2021-07-20 – 2021-07-21 (×2): 3 mL via RESPIRATORY_TRACT
  Filled 2021-07-20 (×2): qty 3

## 2021-07-20 MED ORDER — ALBUTEROL SULFATE (2.5 MG/3ML) 0.083% IN NEBU: 2.5000 mg | INHALATION_SOLUTION | RESPIRATORY_TRACT | Status: DC | PRN

## 2021-07-20 MED ORDER — INSULIN ASPART 100 UNIT/ML IJ SOLN
0.0000 [IU] | Freq: Three times a day (TID) | INTRAMUSCULAR | Status: DC
  Administered 2021-07-20 (×2): 2 [IU] via SUBCUTANEOUS
  Administered 2021-07-21: 3 [IU] via SUBCUTANEOUS

## 2021-07-20 MED ORDER — ACETAMINOPHEN 650 MG RE SUPP: 650.0000 mg | Freq: Four times a day (QID) | RECTAL | Status: DC | PRN

## 2021-07-20 MED ORDER — IPRATROPIUM-ALBUTEROL 0.5-2.5 (3) MG/3ML IN SOLN: 3.0000 mL | RESPIRATORY_TRACT | Status: DC | PRN

## 2021-07-20 MED ORDER — LACTATED RINGERS IV SOLN: INTRAVENOUS | Status: AC

## 2021-07-20 MED ORDER — OXYCODONE HCL 5 MG PO TABS
5.0000 mg | ORAL_TABLET | ORAL | Status: DC | PRN
  Administered 2021-07-20 – 2021-07-21 (×4): 5 mg via ORAL
  Filled 2021-07-20 (×4): qty 1

## 2021-07-20 MED ORDER — ACETAMINOPHEN 325 MG PO TABS
650.0000 mg | ORAL_TABLET | Freq: Four times a day (QID) | ORAL | Status: DC | PRN
  Administered 2021-07-20 – 2021-07-21 (×5): 650 mg via ORAL
  Filled 2021-07-20 (×5): qty 2

## 2021-07-20 MED ORDER — NICOTINE 21 MG/24HR TD PT24
21.0000 mg | MEDICATED_PATCH | Freq: Every day | TRANSDERMAL | Status: DC
  Administered 2021-07-20 – 2021-07-21 (×2): 21 mg via TRANSDERMAL
  Filled 2021-07-20 (×2): qty 1

## 2021-07-20 MED ORDER — ALBUTEROL SULFATE HFA 108 (90 BASE) MCG/ACT IN AERS: 1.0000 | INHALATION_SPRAY | RESPIRATORY_TRACT | Status: DC | PRN

## 2021-07-20 MED ORDER — HEPARIN SODIUM (PORCINE) 5000 UNIT/ML IJ SOLN
5000.0000 [IU] | Freq: Three times a day (TID) | INTRAMUSCULAR | Status: DC
  Administered 2021-07-20 – 2021-07-21 (×5): 5000 [IU] via SUBCUTANEOUS
  Filled 2021-07-20 (×4): qty 1

## 2021-07-20 MED ORDER — ONDANSETRON HCL 4 MG/2ML IJ SOLN: 4.0000 mg | Freq: Four times a day (QID) | INTRAMUSCULAR | Status: DC | PRN

## 2021-07-20 NOTE — Progress Notes (Signed)
Relative named Sherilyn Cooter left his number in case we need to contact.  [phone]

## 2021-07-20 NOTE — Progress Notes (Signed)
Patient moaning quite extensively. She received tylenol earlier this morning for fever, temperature improved. Provided pain pill at this time for patient comfort. Will reassess.

## 2021-07-20 NOTE — H&P (Signed)
TRH H&P    Patient Demographics:    Katrina Good, is a 50 y.o. female  MRN: [id number]  DOB - 02/05/1971  Admit Date - 07/19/2021  Referring MD/NP/PA: Wilkie Aye  Outpatient Primary MD for the patient is Jacquelin Hawking, PA-C  Patient coming from: Home  Chief complaint-generalized weakness   HPI:    Katrina Good  is a 50 y.o. female, with history of acid reflux, diabetes mellitus type 2, hypertension, substance abuse, and more presents to ED with a chief complaint of stomach pain and head pain to me, but generalized weakness to the ED doctor.  Patient is overall poor historian.  She told the ED doctor that she had generalized weakness, productive cough, fever and chills.  EMS was called for possible seizure, but no seizure-like activity was witnessed that did not seem consistent with the story.  Patient said that she laid down on the ground to take a nap because she was so weak, and EMS was standing over her when she woke up.  Husband was at bedside and does not really know what was going on either.  Mother is on the telephone on speaker phone, and she does not know what happened either.  Patient was complaining of shortness of breath at presentation.  To me patient is complaining of stomach pain and head pain.  She says been worse since it started.  It started earlier today.  She denies nausea and vomiting.  She reports no vision or hearing changes.  She does have a ringing in her ears but that can be normal for her.  She admits to shortness of breath and cough that is productive of mucus with brownish in color and clear.  She denies fever and chills.  During exam patient is somnolent, slurred speech, and irritable regarding eating.  She reports its been 3 days since she has had any food and that she has to have food now.  When we discussed that she is not alert enough to eat, she says she is going to sign herself out AMA.   Since she is so drowsy I advised her that she is not able to sign herself out AMA with his altered mental status.  Together we decided to try a swallow study before trying soft food since she is so hungry.  Patient reports he smokes 2 packs a day, does not drink alcohol, does not use illicit drugs.  She reports her last cocaine use was 3 days ago.  She reports that is also when she had onset of her symptoms.  She is vaccinated for COVID.  Patient full code.  In the ED Temp 102.1, improved to 100.9, heart rate 94, respiratory rate 19, blood pressure 134/94, satting at 100% on 2 L Leukocytosis 13.1, hemoglobin 15.1 Potassium 3.4 Lactic acid initially 0.9 and then 1.2 Negative respiratory panel Blood cultures pending Chest x-ray shows infection versus mass She was started on Rocephin, Zithromax, sepsis fluid bolus, continued on LR 150 MLS per hour Admission was requested for further work-up of sepsis secondary to pneumonia  Review of systems:    In addition to the HPI above,  No Fever-chills, No Headache, No changes with Vision or hearing, No problems swallowing food or Liquids, No Chest pain,  No Nausea or Vomiting, bowel movements are regular, No Blood in stool or Urine, No dysuria, No new skin rashes or bruises, No new joints pains-aches,  No new weakness, tingling, numbness in any extremity, No recent weight gain or loss, No polyuria, polydypsia or polyphagia, No significant Mental Stressors.  All other systems reviewed and are negative.    Past History of the following :    Past Medical History:  Diagnosis Date   Acid reflux    Asthma    Bronchitis    Chronic heel pain, left    Diabetes mellitus    Hypertension    Seizures (HCC)    onset was 08/2020   Substance abuse (HCC)    pt denies   Suicidal thoughts    pt denies      Past Surgical History:  Procedure Laterality Date   ABDOMINAL HYSTERECTOMY     partial   CESAREAN SECTION     x 2      Social  History:      Social History   Tobacco Use   Smoking status: Every Day    Packs/day: 2.00    Years: 33.00    Pack years: 66.00    Types: Cigarettes   Smokeless tobacco: Never   Tobacco comments:    2-3 packs  Substance Use Topics   Alcohol use: Not Currently       Family History :     Family History  Problem Relation Age of Onset   Diabetes Mother    Hypertension Mother    Heart failure Mother    Seizures Father    Cancer Father    Hypertension Father    Heart failure Father    Asthma Father      Home Medications:   Prior to Admission medications   Medication Sig Start Date End Date Taking? Authorizing Provider  albuterol (PROVENTIL HFA) 108 (90 Base) MCG/ACT inhaler INHALE 2 PUFFS BY MOUTH EVERY 6 HOURS AS NEEDED FOR COUGHING, WHEEZING, OR SHORTNESS OF BREATH 06/11/21   Jacquelin Hawking, PA-C  amLODipine (NORVASC) 5 MG tablet TAKE 1 Tablet BY MOUTH ONCE EVERY DAY 06/11/21   Jacquelin Hawking, PA-C  aspirin EC 81 MG tablet Take 243 mg by mouth daily. Take 3 tabs daily (243 mg)    [provider]  diclofenac (VOLTAREN) 75 MG EC tablet Take 1 tablet (75 mg total) by mouth 2 (two) times daily as needed. 03/24/21   Jacquelin Hawking, PA-C  levETIRAcetam (KEPPRA) 500 MG tablet Take 1 tablet (500 mg total) by mouth 2 (two) times daily. 03/10/21   Sabas Sous, MD     Allergies:     Allergies  Allergen Reactions   Doxycycline Hives   Strawberry Extract Swelling    Other reaction(s): Facial swelling     Physical Exam:   Vitals  Blood pressure (!) 159/83, pulse 91, temperature (!) 101.1 F (38.4 C), temperature source Oral, resp. rate 18, height 5\' 9"  (1.753 m), weight 88.4 kg, SpO2 95 %.  1.  General: Patient lying supine in bed,  no acute distress   2. Psychiatric: Somnolent and oriented x 3, irritable and behavior is normal for situation, pleasant and cooperative with exam   3. Neurologic: Speech is slurred and language is normal, face is  symmetric, moves  all 4 extremities voluntarily,     4. HEENMT:  Head is atraumatic, normocephalic, pupils reactive to light, neck is supple, trachea is midline, mucous membranes are moist   5. Respiratory : Left lung is clear to auscultation with right lung having rhonchi, no increase in work of breathing or accessory muscle use, 2 L nasal cannula in place   6. Cardiovascular : Heart rate normal, rhythm is regular, no murmurs, rubs or gallops, no peripheral edema, peripheral pulses palpated   7. Gastrointestinal:  Abdomen is soft, nondistended, nontender to palpation bowel sounds active, no masses or organomegaly palpated   8. Skin:  Skin is warm, dry and intact without rashes, acute lesions, or ulcers on limited exam   9.Musculoskeletal:  No acute deformities or trauma, no asymmetry in tone, no peripheral edema, peripheral pulses palpated, no tenderness to palpation in the extremities    Data Review:    CBC Recent Labs  Lab 07/19/21 1943  WBC 13.1*  HGB 15.4*  HCT 47.0*  PLT 149*  MCV 82.6  MCH 27.1  MCHC 32.8  RDW 15.7*  LYMPHSABS 1.5  MONOABS 1.5*  EOSABS 0.0  BASOSABS 0.1   ------------------------------------------------------------------------------------------------------------------  Results for orders placed or performed during the hospital encounter of 07/19/21 (from the past 48 hour(s))  Urinalysis, Routine w reflex microscopic Urine, In & Out Cath     Status: Abnormal   Collection Time: 07/19/21  7:36 PM  Result Value Ref Range   Color, Urine AMBER (A) YELLOW    Comment: BIOCHEMICALS MAY BE AFFECTED BY COLOR   APPearance HAZY (A) CLEAR   Specific Gravity, Urine 1.035 (H) 1.005 - 1.030   pH 5.0 5.0 - 8.0   Glucose, UA NEGATIVE NEGATIVE mg/dL   Hgb urine dipstick MODERATE (A) NEGATIVE   Bilirubin Urine NEGATIVE NEGATIVE   Ketones, ur 20 (A) NEGATIVE mg/dL   Protein, ur 161 (A) NEGATIVE mg/dL   Nitrite NEGATIVE NEGATIVE   Leukocytes,Ua NEGATIVE  NEGATIVE   RBC / HPF 21-50 0 - 5 RBC/hpf   WBC, UA 0-5 0 - 5 WBC/hpf   Bacteria, UA RARE (A) NONE SEEN   Squamous Epithelial / LPF 0-5 0 - 5   Mucus PRESENT    Hyaline Casts, UA PRESENT     Comment: Performed at [facility], [address] [place], [place] [address]  Resp Panel by RT-PCR (Flu A&B, Covid) Nasopharyngeal Swab     Status: None   Collection Time: 07/19/21  7:42 PM   Specimen: Nasopharyngeal Swab; Nasopharyngeal(NP) swabs in vial transport medium  Result Value Ref Range   SARS Coronavirus 2 by RT PCR NEGATIVE NEGATIVE    Comment: (NOTE) SARS-CoV-2 target nucleic acids are NOT DETECTED.  The SARS-CoV-2 RNA is generally detectable in upper respiratory specimens during the acute phase of infection. The lowest concentration of SARS-CoV-2 viral copies this assay can detect is 138 copies/mL. A negative result does not preclude SARS-Cov-2 infection and should not be used as the sole basis for treatment or other patient management decisions. A negative result may occur with  improper specimen collection/handling, submission of specimen other than nasopharyngeal swab, presence of viral mutation(s) within the areas targeted by this assay, and inadequate number of viral copies(<138 copies/mL). A negative result must be combined with clinical observations, patient history, and epidemiological information. The expected result is Negative.  Fact Sheet for Patients:  BloggerCourse.com  Fact Sheet for Healthcare Providers:  SeriousBroker.it  This test is no t yet approved or cleared by  the [employer] and  has been authorized for detection and/or diagnosis of SARS-CoV-2 by FDA under an Emergency Use Authorization (EUA). This EUA will remain  in effect (meaning this test can be used) for the duration of the COVID-19 declaration under Section 564(b)(1) of the Act, 21 U.S.C.section 360bbb-3(b)(1), unless the authorization is  terminated  or revoked sooner.       Influenza A by PCR NEGATIVE NEGATIVE   Influenza B by PCR NEGATIVE NEGATIVE    Comment: (NOTE) The Xpert Xpress SARS-CoV-2/FLU/RSV plus assay is intended as an aid in the diagnosis of influenza from Nasopharyngeal swab specimens and should not be used as a sole basis for treatment. Nasal washings and aspirates are unacceptable for Xpert Xpress SARS-CoV-2/FLU/RSV testing.  Fact Sheet for Patients: BloggerCourse.com  Fact Sheet for Healthcare Providers: SeriousBroker.it  This test is not yet approved or cleared by the [place] FDA and has been authorized for detection and/or diagnosis of SARS-CoV-2 by FDA under an Emergency Use Authorization (EUA). This EUA will remain in effect (meaning this test can be used) for the duration of the COVID-19 declaration under Section 564(b)(1) of the Act, 21 U.S.C. section 360bbb-3(b)(1), unless the authorization is terminated or revoked.  Performed at [facility], [address], [place], [place] [address]   Comprehensive metabolic panel     Status: Abnormal   Collection Time: 07/19/21  7:43 PM  Result Value Ref Range   Sodium 130 (L) 135 - 145 mmol/L   Potassium 3.4 (L) 3.5 - 5.1 mmol/L   Chloride 98 98 - 111 mmol/L   CO2 24 22 - 32 mmol/L   Glucose, Bld 151 (H) 70 - 99 mg/dL    Comment: Glucose reference range applies only to samples taken after fasting for at least 8 hours.   BUN 12 6 - 20 mg/dL   Creatinine, Ser 0.94 0.44 - 1.00 mg/dL   Calcium 8.0 (L) 8.9 - 10.3 mg/dL   Total Protein 7.8 6.5 - 8.1 g/dL   Albumin 3.5 3.5 - 5.0 g/dL   AST 14 (L) 15 - 41 U/L   ALT 17 0 - 44 U/L   Alkaline Phosphatase 101 38 - 126 U/L   Total Bilirubin 1.1 0.3 - 1.2 mg/dL   GFR, Estimated >70 >96 mL/min    Comment: (NOTE) Calculated using the CKD-EPI Creatinine Equation (2021)    Anion gap 8 5 - 15    Comment: Performed at [facility], [address]  [place], [place] [address]  Lactic acid, plasma     Status: None   Collection Time: 07/19/21  7:43 PM  Result Value Ref Range   Lactic Acid, Venous 0.9 0.5 - 1.9 mmol/L    Comment: Performed at [facility], [address] [place], [place] [address]  CBC with Differential     Status: Abnormal   Collection Time: 07/19/21  7:43 PM  Result Value Ref Range   WBC 13.1 (H) 4.0 - 10.5 K/uL   RBC 5.69 (H) 3.87 - 5.11 MIL/uL   Hemoglobin 15.4 (H) 12.0 - 15.0 g/dL   HCT 54.6 (H) 50.3 - 54.6 %   MCV 82.6 80.0 - 100.0 fL   MCH 27.1 26.0 - 34.0 pg   MCHC 32.8 30.0 - 36.0 g/dL   RDW 56.8 (H) 12.7 - 51.7 %   Platelets 149 (L) 150 - 400 K/uL   nRBC 0.0 0.0 - 0.2 %   Neutrophils Relative % 76 %   Neutro Abs 10.0 (  H) 1.7 - 7.7 K/uL   Lymphocytes Relative 11 %   Lymphs Abs 1.5 0.7 - 4.0 K/uL   Monocytes Relative 11 %   Monocytes Absolute 1.5 (H) 0.1 - 1.0 K/uL   Eosinophils Relative 0 %   Eosinophils Absolute 0.0 0.0 - 0.5 K/uL   Basophils Relative 1 %   Basophils Absolute 0.1 0.0 - 0.1 K/uL   Immature Granulocytes 1 %   Abs Immature Granulocytes 0.07 0.00 - 0.07 K/uL    Comment: Performed at [facility], [address], [place], [place] [address]  Protime-INR     Status: Abnormal   Collection Time: 07/19/21  7:43 PM  Result Value Ref Range   Prothrombin Time 15.3 (H) 11.4 - 15.2 seconds   INR 1.2 0.8 - 1.2    Comment: (NOTE) INR goal varies based on device and disease states. Performed at [facility], [address] [place], [place] [address]   Lactic acid, plasma     Status: None   Collection Time: 07/19/21  9:32 PM  Result Value Ref Range   Lactic Acid, Venous 1.2 0.5 - 1.9 mmol/L    Comment: Performed at [facility], [address] [place], [place] [address]    Chemistries  Recent Labs  Lab 07/19/21 1943  NA 130*  K 3.4*  CL 98  CO2 24  GLUCOSE 151*  BUN 12  CREATININE 0.89  CALCIUM 8.0*  AST 14*  ALT 17  ALKPHOS 101  BILITOT 1.1    ------------------------------------------------------------------------------------------------------------------  ------------------------------------------------------------------------------------------------------------------ GFR: Estimated Creatinine Clearance: 89.7 mL/min (by C-G formula based on SCr of 0.89 mg/dL). Liver Function Tests: Recent Labs  Lab 07/19/21 1943  AST 14*  ALT 17  ALKPHOS 101  BILITOT 1.1  PROT 7.8  ALBUMIN 3.5   No results for input(s): LIPASE, AMYLASE in the last 168 hours. No results for input(s): AMMONIA in the last 168 hours. Coagulation Profile: Recent Labs  Lab 07/19/21 1943  INR 1.2   Cardiac Enzymes: No results for input(s): CKTOTAL, CKMB, CKMBINDEX, TROPONINI in the last 168 hours. BNP (last 3 results) No results for input(s): PROBNP in the last 8760 hours. HbA1C: No results for input(s): HGBA1C in the last 72 hours. CBG: No results for input(s): GLUCAP in the last 168 hours. Lipid Profile: No results for input(s): CHOL, HDL, LDLCALC, TRIG, CHOLHDL, LDLDIRECT in the last 72 hours. Thyroid Function Tests: No results for input(s): TSH, T4TOTAL, FREET4, T3FREE, THYROIDAB in the last 72 hours. Anemia Panel: No results for input(s): VITAMINB12, FOLATE, FERRITIN, TIBC, IRON, RETICCTPCT in the last 72 hours.  --------------------------------------------------------------------------------------------------------------- Urine analysis:    Component Value Date/Time   COLORURINE AMBER (A) 07/19/2021 1936   APPEARANCEUR HAZY (A) 07/19/2021 1936   LABSPEC 1.035 (H) 07/19/2021 1936   PHURINE 5.0 07/19/2021 1936   GLUCOSEU NEGATIVE 07/19/2021 1936   HGBUR MODERATE (A) 07/19/2021 1936   BILIRUBINUR NEGATIVE 07/19/2021 1936   BILIRUBINUR negative 05/25/2021 1005   KETONESUR 20 (A) 07/19/2021 1936   PROTEINUR 100 (A) 07/19/2021 1936   UROBILINOGEN 1.0 05/25/2021 1005   UROBILINOGEN 0.2 07/24/2015 1234   NITRITE NEGATIVE 07/19/2021 1936    LEUKOCYTESUR NEGATIVE 07/19/2021 1936      Imaging Results:    DG Chest Port 1 View  Result Date: 07/19/2021 CLINICAL DATA:  Possible sepsis. EXAM: PORTABLE CHEST 1 VIEW COMPARISON:  CT of the chest 01/05/2021.  Chest x-ray 08/28/2020. FINDINGS: The heart is upper limits of normal in size. Mediastinal silhouette is within normal limits. There  are linear bands of atelectasis in the left mid lung. There is a fate rounded density projecting over the right lower lung 6.7 cm. There is a small right pleural effusion. There is no pneumothorax. No acute fractures are seen. IMPRESSION: 1. Rounded density projecting over the right lower lung worrisome for focal parenchymal process such as infection or mass. Consider CT. 2. Bilateral atelectasis. Electronically Signed   By: Darliss Cheney M.D.   On: 07/19/2021 20:21    My personal review of EKG: Rhythm sinus tach, Rate 100/min, QTc 435 ,no Acute ST changes   Assessment & Plan:    Active Problems:   Sepsis (HCC)   Sepsis secondary to pneumonia Sepsis secondary to pneumonia SIRS criteria temp 102.1, heart rate 94, leukocytosis 13.1, respiratory failure Most likely source of infection is pneumonia Life threatening organ dysfunction 2/2 infection is evidenced by: Respiratory failure requiring 2 L nasal cannula Antibiotics started Rocephin and Zithromax Fluids given prior to admission 3 L bolus Sepsis order set utilized Monitor this patient on telemetry level of care  Community-acquired pneumonia Community Acquired Pneumonia As evidenced by chest x-ray, leukocytosis, fever, cough Most likely causative organism is strep pneumonia Pathogen directed therapy with Rocephin and Zithromax to cover any atypicals Blood cultures, sputum cultures, and urine antigens pending Metabolic encephalopathy Husband at bedside reports patient is usually full of energy, and this somnolence this is a change in mental status from her Hypoxia, sepsis  contributing Patient has history of substance abuse with admitted cocaine use, UDS pending, withdrawal could also be contributing she has not used in 3 days  Substance abuse Counseled on cessation UDS ordered Patient reports her last use of cocaine was 3 days ago Continue to monitor Tobacco use disorder Counseled on cessation Continue patch ordered Diabetes mellitus type 2 Current glucose 151 Sliding scale coverage Respiratory failure with hypoxia Secondary to pneumonia Continue Rocephin and Zithromax Pneumonia order set utilized, blood cultures, sputum cultures, urine antigens pending Wean off O2 as tolerated No hypercapnia on VBG Continue monitor Hypertension Continue amlodipine Seizure disorder Continue Keppra History is not consistent with a seizure today.    DVT Prophylaxis-   Heparin- SCDs   AM Labs Ordered, also please review Full Orders  Family Communication: Admission, patients condition and plan of care including tests being ordered have been discussed with the patient and husband who indicate understanding and agree with the plan and Code Status.  Code Status: Full  Admission status: Inpatient :The appropriate admission status for this patient is INPATIENT. Inpatient status is judged to be reasonable and necessary in order to provide the required intensity of service to ensure the patient's safety. The patient's presenting symptoms, physical exam findings, and initial radiographic and laboratory data in the context of their chronic comorbidities is felt to place them at high risk for further clinical deterioration. Furthermore, it is not anticipated that the patient will be medically stable for discharge from the hospital within 2 midnights of admission. The following factors support the admission status of inpatient.     The patient's presenting symptoms include generalized weakness, cough. The worrisome physical exam findings include hypoxia requiring 2 L nasal  cannula. The initial radiographic and laboratory data are worrisome because of pneumonia on chest x-ray. The chronic co-morbidities include substance abuse, hypertension, seizure disorder.       * I certify that at the point of admission it is my clinical judgment that the patient will require inpatient hospital care spanning beyond 2 midnights from the point  of admission due to high intensity of service, high risk for further deterioration and high frequency of surveillance required.*  Time spent in minutes : 65   Antoney Biven B Zierle-Ghosh DO

## 2021-07-20 NOTE — Telephone Encounter (Signed)
Noted, thank you

## 2021-07-20 NOTE — Plan of Care (Signed)

## 2021-07-20 NOTE — Progress Notes (Addendum)
Date and time results received: 07/20/21 0338   Test: PO2 Critical Value: < 31.0  Name of Provider Notified: Dr. Carren Rang

## 2021-07-20 NOTE — Progress Notes (Signed)
PROGRESS NOTE    Katrina Good  WUJ:[id number] DOB: 29-Mar-1971 DOA: 07/19/2021 PCP: Jacquelin Hawking, PA-C   Brief Narrative:  50 year old with history of DM2, GERD, HTN, substance abuse brought to the hospital for generalized weakness.  Upon admission she had also reported of stomach pain and headaches.  History was difficult to obtain as patient was "high".  She was admitted to the hospital with diagnosis of sepsis secondary to pneumonia.   Assessment & Plan:   Active Problems:   Sepsis (HCC)  Headaches Acute metabolic encephalopathy, no focal neurodeficits - Due to somnolescent's and complains of headache will obtain CT of the head without contrast - Check ammonia, TSH  Epigastric abdominal pain - Unclear etiology.  Check lipase.  CT abdomen pelvis without contrast.  LFTs appear to be okay.  Protonix p.o. twice daily.  Sepsis secondary to pneumonia - Sepsis physiology is improving.  There could be concerns of aspiration? Chest x-ray shows right lower lobe mass-pneumonia versus abscess?  Malignancy?.  Will obtain CT of the chest without contrast. - Empiric IV antibiotics Rocephin and azithromycin - Bronchodilators scheduled and as needed - I-S - IV fluids -Once she is more awake, she will need PT/OT and speech eval  Fever - multifactorial-underlying infection, drug withdrawal Seizure.  Has not had seizure episode in quite a long time according to her daughter  Substance abuse-tobacco, cocaine - UDS pending but she admits of using cocaine 3 days ago. - Counseled to quit using.  Nicotine patch as needed  Diabetes mellitus type 2 - Sliding scale and Accu-Cheks  History of seizure disorder - On Keppra  Essential hypertension - Norvasc, as needed IV hydralazine  DVT prophylaxis: heparin injection 5,000 Units Start: 07/20/21 0600 SCDs Start: 07/20/21 0215 Code Status: Full code Family Communication:  Daughter updated.   Status is: Inpatient  Remains inpatient  appropriate because:Inpatient level of care appropriate due to severity of illness  Dispo: The patient is from: Home              Anticipated d/c is to: Home              Patient currently is not medically stable to d/c.   Difficult to place patient No       Subjective: Seen and examined at bedside, she is a very poor historian but continues to tell me she is having a lot of headache and epigastric abdominal pain radiating towards her back.  Review of Systems Otherwise negative except as per HPI, including: General: Denies fever, chills, night sweats or unintended weight loss. Resp: Denies cough, wheezing, shortness of breath. Cardiac: Denies chest pain, palpitations, orthopnea, paroxysmal nocturnal dyspnea. GI: Denies abdominal pain, nausea, vomiting, diarrhea or constipation GU: Denies dysuria, frequency, hesitancy or incontinence MS: Denies muscle aches, joint pain or swelling Neuro: Denies headache, neurologic deficits (focal weakness, numbness, tingling), abnormal gait Psych: Denies anxiety, depression, SI/HI/AVH Skin: Denies new rashes or lesions ID: Denies sick contacts, exotic exposures, travel  Examination:  General exam: Drowsy but easily arousable Respiratory system: Clear to auscultation. Respiratory effort normal. Cardiovascular system: S1 & S2 heard, RRR. No JVD, murmurs, rubs, gallops or clicks. No pedal edema. Gastrointestinal system: Abdomen is nondistended, soft and nontender. No organomegaly or masses felt. Normal bowel sounds heard. Central nervous system: Alert and oriented X3 but drowsy.  No focal neurodeficits Extremities: Symmetric 5 x 5 power. Skin: No rashes, lesions or ulcers Psychiatry: Poor judgment and insight    Objective: Vitals:   07/20/21 0112  07/20/21 0327 07/20/21 0520 07/20/21 0714  BP: 133/80 (!) 159/83 137/80   Pulse: 81 91 89   Resp: 17 18 19    Temp: 99.1 F (37.3 C) (!) 101.1 F (38.4 C) 99.7 F (37.6 C)   TempSrc: Oral Oral  Oral   SpO2: 99% 95% 96% 97%  Weight: 88.4 kg     Height: 5\' 9"  (1.753 m)       Intake/Output Summary (Last 24 hours) at 07/20/2021 1122 Last data filed at 07/20/2021 0400 Gross per 24 hour  Intake 441.95 ml  Output --  Net 441.95 ml   Filed Weights   07/19/21 1936 07/20/21 0112  Weight: 90.7 kg 88.4 kg     Data Reviewed:   CBC: Recent Labs  Lab 07/19/21 1943 07/20/21 0312  WBC 13.1* 11.8*  NEUTROABS 10.0* 9.4*  HGB 15.4* 14.3  HCT 47.0* 43.6  MCV 82.6 84.0  PLT 149* 145*   Basic Metabolic Panel: Recent Labs  Lab 07/19/21 1943 07/20/21 0312  NA 130* 136  K 3.4* 3.6  CL 98 103  CO2 24 27  GLUCOSE 151* 125*  BUN 12 9  CREATININE 0.89 0.85  CALCIUM 8.0* 8.2*  MG  --  2.1   GFR: Estimated Creatinine Clearance: 93.9 mL/min (by C-G formula based on SCr of 0.85 mg/dL). Liver Function Tests: Recent Labs  Lab 07/19/21 1943 07/20/21 0312  AST 14* 14*  ALT 17 15  ALKPHOS 101 89  BILITOT 1.1 0.6  PROT 7.8 7.2  ALBUMIN 3.5 3.0*   No results for input(s): LIPASE, AMYLASE in the last 168 hours. No results for input(s): AMMONIA in the last 168 hours. Coagulation Profile: Recent Labs  Lab 07/19/21 1943 07/20/21 0312  INR 1.2 1.2   Cardiac Enzymes: No results for input(s): CKTOTAL, CKMB, CKMBINDEX, TROPONINI in the last 168 hours. BNP (last 3 results) No results for input(s): PROBNP in the last 8760 hours. HbA1C: Recent Labs    07/20/21 0312  HGBA1C 6.0*   CBG: Recent Labs  Lab 07/20/21 0745 07/20/21 1107  GLUCAP 134* 115*   Lipid Profile: No results for input(s): CHOL, HDL, LDLCALC, TRIG, CHOLHDL, LDLDIRECT in the last 72 hours. Thyroid Function Tests: No results for input(s): TSH, T4TOTAL, FREET4, T3FREE, THYROIDAB in the last 72 hours. Anemia Panel: No results for input(s): VITAMINB12, FOLATE, FERRITIN, TIBC, IRON, RETICCTPCT in the last 72 hours. Sepsis Labs: Recent Labs  Lab 07/19/21 1943 07/19/21 2132 07/20/21 0312  PROCALCITON  --    --  <0.10  LATICACIDVEN 0.9 1.2  --     Recent Results (from the past 240 hour(s))  Resp Panel by RT-PCR (Flu A&B, Covid) Nasopharyngeal Swab     Status: None   Collection Time: 07/19/21  7:42 PM   Specimen: Nasopharyngeal Swab; Nasopharyngeal(NP) swabs in vial transport medium  Result Value Ref Range Status   SARS Coronavirus 2 by RT PCR NEGATIVE NEGATIVE Final    Comment: (NOTE) SARS-CoV-2 target nucleic acids are NOT DETECTED.  The SARS-CoV-2 RNA is generally detectable in upper respiratory specimens during the acute phase of infection. The lowest concentration of SARS-CoV-2 viral copies this assay can detect is 138 copies/mL. A negative result does not preclude SARS-Cov-2 infection and should not be used as the sole basis for treatment or other patient management decisions. A negative result may occur with  improper specimen collection/handling, submission of specimen other than nasopharyngeal swab, presence of viral mutation(s) within the areas targeted by this assay, and inadequate number of viral  copies(<138 copies/mL). A negative result must be combined with clinical observations, patient history, and epidemiological information. The expected result is Negative.  Fact Sheet for Patients:  BloggerCourse.com  Fact Sheet for Healthcare Providers:  SeriousBroker.it  This test is no t yet approved or cleared by the [place] FDA and  has been authorized for detection and/or diagnosis of SARS-CoV-2 by FDA under an Emergency Use Authorization (EUA). This EUA will remain  in effect (meaning this test can be used) for the duration of the COVID-19 declaration under Section 564(b)(1) of the Act, 21 U.S.C.section 360bbb-3(b)(1), unless the authorization is terminated  or revoked sooner.       Influenza A by PCR NEGATIVE NEGATIVE Final   Influenza B by PCR NEGATIVE NEGATIVE Final    Comment: (NOTE) The Xpert Xpress  SARS-CoV-2/FLU/RSV plus assay is intended as an aid in the diagnosis of influenza from Nasopharyngeal swab specimens and should not be used as a sole basis for treatment. Nasal washings and aspirates are unacceptable for Xpert Xpress SARS-CoV-2/FLU/RSV testing.  Fact Sheet for Patients: BloggerCourse.com  Fact Sheet for Healthcare Providers: SeriousBroker.it  This test is not yet approved or cleared by the [place] FDA and has been authorized for detection and/or diagnosis of SARS-CoV-2 by FDA under an Emergency Use Authorization (EUA). This EUA will remain in effect (meaning this test can be used) for the duration of the COVID-19 declaration under Section 564(b)(1) of the Act, 21 U.S.C. section 360bbb-3(b)(1), unless the authorization is terminated or revoked.  Performed at [facility], [address] [place], [place] [address]   Culture, blood (Routine x 2)     Status: None (Preliminary result)   Collection Time: 07/19/21  7:43 PM   Specimen: BLOOD RIGHT FOREARM  Result Value Ref Range Status   Specimen Description BLOOD RIGHT FOREARM  Final   Special Requests   Final    BOTTLES DRAWN AEROBIC AND ANAEROBIC Blood Culture adequate volume   Culture   Final    NO GROWTH < 24 HOURS Performed at [facility], [address], [place], [place] [address]    Report Status PENDING  Incomplete  Culture, blood (Routine x 2)     Status: None (Preliminary result)   Collection Time: 07/19/21  7:45 PM   Specimen: BLOOD RIGHT FOREARM  Result Value Ref Range Status   Specimen Description BLOOD RIGHT FOREARM  Final   Special Requests   Final    BOTTLES DRAWN AEROBIC AND ANAEROBIC Blood Culture adequate volume   Culture   Final    NO GROWTH < 24 HOURS Performed at [facility], [address], [place], [place] [address]    Report Status PENDING  Incomplete         Radiology Studies: DG Chest Port 1 View  Result Date:  07/19/2021 CLINICAL DATA:  Possible sepsis. EXAM: PORTABLE CHEST 1 VIEW COMPARISON:  CT of the chest 01/05/2021.  Chest x-ray 08/28/2020. FINDINGS: The heart is upper limits of normal in size. Mediastinal silhouette is within normal limits. There are linear bands of atelectasis in the left mid lung. There is a fate rounded density projecting over the right lower lung 6.7 cm. There is a small right pleural effusion. There is no pneumothorax. No acute fractures are seen. IMPRESSION: 1. Rounded density projecting over the right lower lung worrisome for focal parenchymal process such as infection or mass. Consider CT. 2. Bilateral atelectasis. Electronically Signed   By: Darliss Cheney M.D.   On: 07/19/2021 20:21  Scheduled Meds:  amLODipine  5 mg Oral Daily   aspirin EC  243 mg Oral Daily   heparin  5,000 Units Subcutaneous Q8H   insulin aspart  0-15 Units Subcutaneous TID WC   insulin aspart  0-5 Units Subcutaneous QHS   ipratropium-albuterol  3 mL Nebulization BID   levETIRAcetam  500 mg Oral BID   nicotine  21 mg Transdermal Daily   pantoprazole  40 mg Oral BID AC   Continuous Infusions:  azithromycin Stopped (07/20/21 0028)   cefTRIAXone (ROCEPHIN)  IV Stopped (07/20/21 0028)   lactated ringers 75 mL/hr at 07/20/21 0923     LOS: 1 day   Time spent= 35 mins    Danta Baumgardner Joline Maxcy, MD [facility]  If 7PM-7AM, please contact night-coverage  07/20/2021, 11:22 AM

## 2021-07-20 NOTE — Progress Notes (Signed)
Notified Dr. Nelson Chimes of patient's elevated temp of 103.1. Awaiting new orders as too soon for more tylenol as standing.

## 2021-07-20 NOTE — Progress Notes (Signed)
Patient mother Annice Pih called for update on patient. Provided and answered all questions.

## 2021-07-20 NOTE — Progress Notes (Signed)
Came in to give patient breathing treatment.  Upon getting sats, found patient to only be at 88 to 89% on RA.  Placed patient on 2L [place].

## 2021-07-20 NOTE — Progress Notes (Signed)
Patient returned from CT, very weak. Does not open eyes, moans with shivering. Provided warm blanket and pain medication as ordered. Patient has a visitor at bedside.

## 2021-07-20 NOTE — Progress Notes (Signed)
Patient's family will stay the night with her to assist patient in safety.

## 2021-07-20 NOTE — Telephone Encounter (Signed)
Pt's mother cancelled appt due to pt in the [facility] hospital in [place].

## 2021-07-21 DIAGNOSIS — J690 Pneumonitis due to inhalation of food and vomit: Secondary | ICD-10-CM

## 2021-07-21 LAB — CBC
HCT: 38.4 % (ref 36.0–46.0)
Hemoglobin: 12.4 g/dL (ref 12.0–15.0)
MCH: 26.8 pg (ref 26.0–34.0)
MCHC: 32.3 g/dL (ref 30.0–36.0)
MCV: 83.1 fL (ref 80.0–100.0)
Platelets: 139 10*3/uL — ABNORMAL LOW (ref 150–400)
RBC: 4.62 MIL/uL (ref 3.87–5.11)
RDW: 15.4 % (ref 11.5–15.5)
WBC: 8.5 10*3/uL (ref 4.0–10.5)
nRBC: 0 % (ref 0.0–0.2)

## 2021-07-21 LAB — EXPECTORATED SPUTUM ASSESSMENT W GRAM STAIN, RFLX TO RESP C

## 2021-07-21 LAB — BASIC METABOLIC PANEL
Anion gap: 5 (ref 5–15)
BUN: 7 mg/dL (ref 6–20)
CO2: 27 mmol/L (ref 22–32)
Calcium: 8.1 mg/dL — ABNORMAL LOW (ref 8.9–10.3)
Chloride: 104 mmol/L (ref 98–111)
Creatinine, Ser: 0.63 mg/dL (ref 0.44–1.00)
GFR, Estimated: >60 mL/min (ref >60)
Glucose, Bld: 105 mg/dL — ABNORMAL HIGH (ref 70–99)
Potassium: 3.6 mmol/L (ref 3.5–5.1)
Sodium: 136 mmol/L (ref 135–145)

## 2021-07-21 LAB — RAPID URINE DRUG SCREEN, HOSP PERFORMED
Amphetamines: NOT DETECTED
Barbiturates: NOT DETECTED
Benzodiazepines: NOT DETECTED
Cocaine: POSITIVE — AB
Opiates: NOT DETECTED
Tetrahydrocannabinol: NOT DETECTED

## 2021-07-21 LAB — MAGNESIUM: Magnesium: 2.2 mg/dL (ref 1.7–2.4)

## 2021-07-21 LAB — GLUCOSE, CAPILLARY
Glucose-Capillary: 103 mg/dL — ABNORMAL HIGH (ref 70–99)
Glucose-Capillary: 155 mg/dL — ABNORMAL HIGH (ref 70–99)

## 2021-07-21 LAB — STREP PNEUMONIAE URINARY ANTIGEN: Strep Pneumo Urinary Antigen: NEGATIVE

## 2021-07-21 MED ORDER — AZITHROMYCIN 250 MG PO TABS
500.0000 mg | ORAL_TABLET | Freq: Every day | ORAL | Status: DC
  Administered 2021-07-21: 500 mg via ORAL
  Filled 2021-07-21: qty 2

## 2021-07-21 MED ORDER — AMOXICILLIN-POT CLAVULANATE 875-125 MG PO TABS
1.0000 | ORAL_TABLET | Freq: Two times a day (BID) | ORAL | Status: DC
  Administered 2021-07-21: 1 via ORAL
  Filled 2021-07-21: qty 1

## 2021-07-21 NOTE — Evaluation (Signed)
Physical Therapy Evaluation Patient Details Name: Katrina Good MRN: [id number] DOB: [birth year] Today's Date: 07/21/2021  History of Present Illness  Katrina Good  is a 50 y.o. female, with history of acid reflux, diabetes mellitus type 2, hypertension, substance abuse, and more presents to ED with a chief complaint of stomach pain and head pain to me, but generalized weakness to the ED doctor.  Patient is overall poor historian.  She told the ED doctor that she had generalized weakness, productive cough, fever and chills.  EMS was called for possible seizure, but no seizure-like activity was witnessed that did not seem consistent with the story.  Patient said that she laid down on the ground to take a nap because she was so weak, and EMS was standing over her when she woke up.  Husband was at bedside and does not really know what was going on either.  Mother is on the telephone on speaker phone, and she does not know what happened either.  Patient was complaining of shortness of breath at presentation.  To me patient is complaining of stomach pain and head pain.  She says been worse since it started.  It started earlier today.  She denies nausea and vomiting.  She reports no vision or hearing changes.  She does have a ringing in her ears but that can be normal for her.  She admits to shortness of breath and cough that is productive of mucus with brownish in color and clear.  She denies fever and chills.  During exam patient is somnolent, slurred speech, and irritable regarding eating.  She reports its been 3 days since she has had any food and that she has to have food now.  When we discussed that she is not alert enough to eat, she says she is going to sign herself out AMA.  Since she is so drowsy I advised her that she is not able to sign herself out AMA with his altered mental status.  Together we decided to try a swallow study before trying soft food since she is so hungry.     Patient reports he smokes  2 packs a day, does not drink alcohol, does not use illicit drugs.  She reports her last cocaine use was 3 days ago.  She reports that is also when she had onset of her symptoms.  She is vaccinated for COVID.  Patient full code.   Clinical Impression     Patient demonstrates independent functional mobility and does not require any level of physical assistance. Demonstrates independent balance and low risk for falls. Able to ambulate continuously without deviation and no SOB or adverse effects noted.  Patient does not require continued physical therapy services at this time, nor does she need continued PT services at time of D/C.  Patient discharged to care of nursing for ambulation daily as tolerated for length of stay.      Recommendations for follow up therapy are one component of a multi-disciplinary discharge planning process, led by the attending physician.  Recommendations may be updated based on patient status, additional functional criteria and insurance authorization.  Follow Up Recommendations No PT follow up    Equipment Recommendations  None recommended by PT    Recommendations for Other Services       Precautions / Restrictions        Mobility  Bed Mobility Overal bed mobility: Independent  Transfers Overall transfer level: Independent                  Ambulation/Gait Ambulation/Gait assistance: Independent Gait Distance (Feet): 400 Feet Assistive device: None Gait Pattern/deviations: WFL(Within Functional Limits)        Stairs Stairs: Yes Stairs assistance: Independent Stair Management: One rail Right Number of Stairs: 4    Wheelchair Mobility    Modified Rankin (Stroke Patients Only)       Balance Overall balance assessment: Independent                                           Pertinent Vitals/Pain Pain Assessment: No/denies pain    Home Living Family/patient expects to be discharged to::  Private residence Living Arrangements: Spouse/significant other Available Help at Discharge: Family Type of Home: House Home Access: Level entry     Home Layout: One level Home Equipment: Environmental consultant - 2 wheels;Wheelchair - manual Additional Comments: pt reports she has DME if needed    Prior Function Level of Independence: Independent               Hand Dominance        Extremity/Trunk Assessment   Upper Extremity Assessment Upper Extremity Assessment: Overall WFL for tasks assessed    Lower Extremity Assessment Lower Extremity Assessment: Overall WFL for tasks assessed    Cervical / Trunk Assessment Cervical / Trunk Assessment: Normal  Communication   Communication: No difficulties  Cognition Arousal/Alertness: Awake/alert Behavior During Therapy: WFL for tasks assessed/performed Overall Cognitive Status: Within Functional Limits for tasks assessed                                        General Comments      Exercises     Assessment/Plan    PT Assessment Patent does not need any further PT services  PT Problem List         PT Treatment Interventions      PT Goals (Current goals can be found in the Care Plan section)  Acute Rehab PT Goals Patient Stated Goal: None needed. Pt at an independent level of function. No deficits or limitations PT Goal Formulation: With patient Time For Goal Achievement: 07/21/21 Potential to Achieve Goals: Good    Frequency     Barriers to discharge        Co-evaluation               AM-PAC PT "6 Clicks" Mobility  Outcome Measure Help needed turning from your back to your side while in a flat bed without using bedrails?: None Help needed moving from lying on your back to sitting on the side of a flat bed without using bedrails?: None Help needed moving to and from a bed to a chair (including a wheelchair)?: None Help needed standing up from a chair using your arms (e.g., wheelchair or bedside  chair)?: None Help needed to walk in hospital room?: None Help needed climbing 3-5 steps with a railing? : None 6 Click Score: 24    End of Session   Activity Tolerance: Patient tolerated treatment well Patient left: in chair;with family/visitor present Nurse Communication: Mobility status PT Visit Diagnosis: Muscle weakness (generalized) (M62.81)    Time: 1445-1500 PT Time Calculation (min) (ACUTE ONLY): 15 min  Charges:   PT Evaluation $PT Eval Low Complexity: 1 Low         3:30 PM, 07/21/21 M. Shary Decamp, PT, DPT Physical Therapist- [place] Office Number: [phone]

## 2021-07-21 NOTE — Discharge Summary (Signed)
Physician Discharge Summary  Katrina Good ZTI:[id number] DOB: 1970/11/06 DOA: 07/19/2021  PCP: Jacquelin Hawking, PA-C  Admit date: 07/19/2021 Discharge date: 07/21/2021  Admitted From: Home Disposition:  left AMA  Recommendations for Outpatient Follow-up:  Left AMA    Brief/Interim Summary: 50 year old with history of DM2, GERD, HTN, substance abuse brought to the hospital for generalized weakness.  Upon admission she had also reported of stomach pain and headaches.  History was difficult to obtain as patient was "high".  She was admitted to the hospital with diagnosis of sepsis secondary to pneumonia.  Patient was started on Rocephin and azithromycin.  CT of the head, chest abdomen pelvis performed showed right lower lobe pneumonia y.  Ammonia and TSH were unremarkable.  Over 2 days in the hospital her mentation started improving.  Speech and swallow therapy recommended dysphagia 3 diet.  Later on in the day patient ended up leaving AGAINST MEDICAL ADVICE.  She was alert awake oriented X3  Body mass index is 28.78 kg/m.         Discharge Diagnoses:  Active Problems:   Sepsis [facility])     Discharge Exam: Vitals:   07/21/21 0924 07/21/21 1355  BP:  127/68  Pulse:  93  Resp:  17  Temp:  98.8 F (37.1 C)  SpO2: 96% 98%   Vitals:   07/21/21 0057 07/21/21 0538 07/21/21 0924 07/21/21 1355  BP: 98/61 117/72  127/68  Pulse: 82 91  93  Resp: 19 19  17   Temp: 98.4 F (36.9 C) 99.2 F (37.3 C)  98.8 F (37.1 C)  TempSrc: Oral Oral  Oral  SpO2: 95% 97% 96% 98%  Weight:      Height:         Discharge Instructions     Allergies  Allergen Reactions   Doxycycline Hives   Strawberry Extract Swelling    Other reaction(s): Facial swelling    You were cared for by a hospitalist during your hospital stay. If you have any questions about your discharge medications or the care you received while you were in the hospital after you are discharged, you can call the unit  and asked to speak with the hospitalist on call if the hospitalist that took care of you is not available. Once you are discharged, your primary care physician will handle any further medical issues. Please note that no refills for any discharge medications will be authorized once you are discharged, as it is imperative that you return to your primary care physician (or establish a relationship with a primary care physician if you do not have one) for your aftercare needs so that they can reassess your need for medications and monitor your lab values.   Procedures/Studies: CT HEAD WO CONTRAST ( )  Result Date: 07/20/2021 CLINICAL DATA:  Mental status change, unknown cause EXAM: CT HEAD WITHOUT CONTRAST TECHNIQUE: Contiguous axial images were obtained from the base of the skull through the vertex without intravenous contrast. COMPARISON:  CT head August 28, 2020. FINDINGS: Brain: No evidence of acute large vascular territory infarction, hemorrhage, hydrocephalus, extra-axial collection or mass lesion/mass effect. Vascular: No hyperdense vessel identified. Calcific intracranial atherosclerosis. Skull: No acute fracture. Sinuses/Orbits: Visualized sinuses are clear. Other: No mastoid effusions. IMPRESSION: No evidence of acute intracranial abnormality. Electronically Signed   By: August 30, 2020 M.D.   On: 07/20/2021 13:52   DG Chest Port 1 View  Result Date: 07/19/2021 CLINICAL DATA:  Possible sepsis. EXAM: PORTABLE CHEST 1 VIEW COMPARISON:  CT of  the chest 01/05/2021.  Chest x-ray 08/28/2020. FINDINGS: The heart is upper limits of normal in size. Mediastinal silhouette is within normal limits. There are linear bands of atelectasis in the left mid lung. There is a fate rounded density projecting over the right lower lung 6.7 cm. There is a small right pleural effusion. There is no pneumothorax. No acute fractures are seen. IMPRESSION: 1. Rounded density projecting over the right lower lung worrisome for  focal parenchymal process such as infection or mass. Consider CT. 2. Bilateral atelectasis. Electronically Signed   By: Darliss Cheney M.D.   On: 07/19/2021 20:21   CT CHEST ABDOMEN PELVIS WO CONTRAST  Result Date: 07/20/2021 CLINICAL DATA:  Epigastric and abdominal pain. Abnormal chest radiography EXAM: CT CHEST, ABDOMEN AND PELVIS WITHOUT CONTRAST TECHNIQUE: Multidetector CT imaging of the chest, abdomen and pelvis was performed following the standard protocol without IV contrast. COMPARISON:  Chest radiography yesterday. FINDINGS: CT CHEST FINDINGS Cardiovascular: Heart size is normal. There is coronary artery calcification in the left system. No aortic atherosclerotic calcification is seen. Mediastinum/Nodes: No mediastinal or hilar mass or lymphadenopathy. Lungs/Pleura: There is bronchopneumonia in the right upper lobe and more extensively in the right lower lobe. Right middle lobe remains clear. Small amount of dependent pleural fluid. Left lung is clear. Musculoskeletal: Negative CT ABDOMEN PELVIS FINDINGS Hepatobiliary: Liver appears normal without contrast. No calcified gallstones. Pancreas: Normal Spleen: Normal Adrenals/Urinary Tract: Adrenal glands are normal. Kidneys are normal. Bladder is normal. Stomach/Bowel: Stomach and small intestine are normal. No colon pathology. Vascular/Lymphatic: Aortic atherosclerosis. No aneurysm. IVC is normal. No adenopathy. Reproductive: Previous hysterectomy.  No pelvic mass. Other: No free fluid or air. Musculoskeletal: Ordinary lower lumbar degenerative changes. IMPRESSION: Right lung pneumonia, more extensive within the right lower lobe than the right upper lobe. Small amount of layering pleural fluid on the right. Left lung is clear. Aortic atherosclerosis. Coronary artery calcification in the left system. No significant abdominal or pelvic finding. Electronically Signed   By: Paulina Fusi M.D.   On: 07/20/2021 14:09     The results of significant diagnostics  from this hospitalization (including imaging, microbiology, ancillary and laboratory) are listed below for reference.     Microbiology: Recent Results (from the past 240 hour(s))  Resp Panel by RT-PCR (Flu A&B, Covid) Nasopharyngeal Swab     Status: None   Collection Time: 07/19/21  7:42 PM   Specimen: Nasopharyngeal Swab; Nasopharyngeal(NP) swabs in vial transport medium  Result Value Ref Range Status   SARS Coronavirus 2 by RT PCR NEGATIVE NEGATIVE Final    Comment: (NOTE) SARS-CoV-2 target nucleic acids are NOT DETECTED.  The SARS-CoV-2 RNA is generally detectable in upper respiratory specimens during the acute phase of infection. The lowest concentration of SARS-CoV-2 viral copies this assay can detect is 138 copies/mL. A negative result does not preclude SARS-Cov-2 infection and should not be used as the sole basis for treatment or other patient management decisions. A negative result may occur with  improper specimen collection/handling, submission of specimen other than nasopharyngeal swab, presence of viral mutation(s) within the areas targeted by this assay, and inadequate number of viral copies(<138 copies/mL). A negative result must be combined with clinical observations, patient history, and epidemiological information. The expected result is Negative.  Fact Sheet for Patients:  BloggerCourse.com  Fact Sheet for Healthcare Providers:  SeriousBroker.it  This test is no t yet approved or cleared by the [place] FDA and  has been authorized for detection and/or diagnosis of SARS-CoV-2  by FDA under an Emergency Use Authorization (EUA). This EUA will remain  in effect (meaning this test can be used) for the duration of the COVID-19 declaration under Section 564(b)(1) of the Act, 21 U.S.C.section 360bbb-3(b)(1), unless the authorization is terminated  or revoked sooner.       Influenza A by PCR NEGATIVE NEGATIVE  Final   Influenza B by PCR NEGATIVE NEGATIVE Final    Comment: (NOTE) The Xpert Xpress SARS-CoV-2/FLU/RSV plus assay is intended as an aid in the diagnosis of influenza from Nasopharyngeal swab specimens and should not be used as a sole basis for treatment. Nasal washings and aspirates are unacceptable for Xpert Xpress SARS-CoV-2/FLU/RSV testing.  Fact Sheet for Patients: BloggerCourse.com  Fact Sheet for Healthcare Providers: SeriousBroker.it  This test is not yet approved or cleared by the [place] FDA and has been authorized for detection and/or diagnosis of SARS-CoV-2 by FDA under an Emergency Use Authorization (EUA). This EUA will remain in effect (meaning this test can be used) for the duration of the COVID-19 declaration under Section 564(b)(1) of the Act, 21 U.S.C. section 360bbb-3(b)(1), unless the authorization is terminated or revoked.  Performed at [facility], [address], [place], [place] [address]   Culture, blood (Routine x 2)     Status: None (Preliminary result)   Collection Time: 07/19/21  7:43 PM   Specimen: BLOOD RIGHT FOREARM  Result Value Ref Range Status   Specimen Description BLOOD RIGHT FOREARM  Final   Special Requests   Final    BOTTLES DRAWN AEROBIC AND ANAEROBIC Blood Culture adequate volume   Culture   Final    NO GROWTH 2 DAYS Performed at [facility], [address], [place], [place] [address]    Report Status PENDING  Incomplete  Culture, blood (Routine x 2)     Status: None (Preliminary result)   Collection Time: 07/19/21  7:45 PM   Specimen: BLOOD RIGHT FOREARM  Result Value Ref Range Status   Specimen Description BLOOD RIGHT FOREARM  Final   Special Requests   Final    BOTTLES DRAWN AEROBIC AND ANAEROBIC Blood Culture adequate volume   Culture   Final    NO GROWTH 2 DAYS Performed at [facility], [address] [place], [place] [address]    Report Status PENDING  Incomplete      Labs: BNP (last 3 results) No results for input(s): BNP in the last 8760 hours. Basic Metabolic Panel: Recent Labs  Lab 07/19/21 1943 07/20/21 0312 07/21/21 0516  NA 130* 136 136  K 3.4* 3.6 3.6  CL 98 103 104  CO2 24 27 27   GLUCOSE 151* 125* 105*  BUN 12 9 7   CREATININE 0.89 0.85 0.63  CALCIUM 8.0* 8.2* 8.1*  MG  --  2.1 2.2   Liver Function Tests: Recent Labs  Lab 07/19/21 1943 07/20/21 0312  AST 14* 14*  ALT 17 15  ALKPHOS 101 89  BILITOT 1.1 0.6  PROT 7.8 7.2  ALBUMIN 3.5 3.0*   Recent Labs  Lab 07/20/21 0312  LIPASE 35   Recent Labs  Lab 07/20/21 1202  AMMONIA 22   CBC: Recent Labs  Lab 07/19/21 1943 07/20/21 0312 07/21/21 0516  WBC 13.1* 11.8* 8.5  NEUTROABS 10.0* 9.4*  --   HGB 15.4* 14.3 12.4  HCT 47.0* 43.6 38.4  MCV 82.6 84.0 83.1  PLT 149* 145* 139*   Cardiac Enzymes: No results for input(s): CKTOTAL, CKMB, CKMBINDEX, TROPONINI in the last 168 hours. BNP:  Invalid input(s): POCBNP CBG: Recent Labs  Lab 07/20/21 1107 07/20/21 1659 07/20/21 2045 07/21/21 0723 07/21/21 1140  GLUCAP 115* 126* 106* 103* 155*   D-Dimer No results for input(s): DDIMER in the last 72 hours. Hgb A1c Recent Labs    07/20/21 0312  HGBA1C 6.0*   Lipid Profile No results for input(s): CHOL, HDL, LDLCALC, TRIG, CHOLHDL, LDLDIRECT in the last 72 hours. Thyroid function studies Recent Labs    07/20/21 1202  TSH 1.332   Anemia work up No results for input(s): VITAMINB12, FOLATE, FERRITIN, TIBC, IRON, RETICCTPCT in the last 72 hours. Urinalysis    Component Value Date/Time   COLORURINE AMBER (A) 07/19/2021 1936   APPEARANCEUR HAZY (A) 07/19/2021 1936   LABSPEC 1.035 (H) 07/19/2021 1936   PHURINE 5.0 07/19/2021 1936   GLUCOSEU NEGATIVE 07/19/2021 1936   HGBUR MODERATE (A) 07/19/2021 1936   BILIRUBINUR NEGATIVE 07/19/2021 1936   BILIRUBINUR negative 05/25/2021 1005   KETONESUR 20 (A) 07/19/2021 1936   PROTEINUR 100 (A) 07/19/2021 1936    UROBILINOGEN 1.0 05/25/2021 1005   UROBILINOGEN 0.2 07/24/2015 1234   NITRITE NEGATIVE 07/19/2021 1936   LEUKOCYTESUR NEGATIVE 07/19/2021 1936   Sepsis Labs Invalid input(s): PROCALCITONIN,  WBC,  LACTICIDVEN Microbiology Recent Results (from the past 240 hour(s))  Resp Panel by RT-PCR (Flu A&B, Covid) Nasopharyngeal Swab     Status: None   Collection Time: 07/19/21  7:42 PM   Specimen: Nasopharyngeal Swab; Nasopharyngeal(NP) swabs in vial transport medium  Result Value Ref Range Status   SARS Coronavirus 2 by RT PCR NEGATIVE NEGATIVE Final    Comment: (NOTE) SARS-CoV-2 target nucleic acids are NOT DETECTED.  The SARS-CoV-2 RNA is generally detectable in upper respiratory specimens during the acute phase of infection. The lowest concentration of SARS-CoV-2 viral copies this assay can detect is 138 copies/mL. A negative result does not preclude SARS-Cov-2 infection and should not be used as the sole basis for treatment or other patient management decisions. A negative result may occur with  improper specimen collection/handling, submission of specimen other than nasopharyngeal swab, presence of viral mutation(s) within the areas targeted by this assay, and inadequate number of viral copies(<138 copies/mL). A negative result must be combined with clinical observations, patient history, and epidemiological information. The expected result is Negative.  Fact Sheet for Patients:  BloggerCourse.com  Fact Sheet for Healthcare Providers:  SeriousBroker.it  This test is no t yet approved or cleared by the [place] FDA and  has been authorized for detection and/or diagnosis of SARS-CoV-2 by FDA under an Emergency Use Authorization (EUA). This EUA will remain  in effect (meaning this test can be used) for the duration of the COVID-19 declaration under Section 564(b)(1) of the Act, 21 U.S.C.section 360bbb-3(b)(1), unless the  authorization is terminated  or revoked sooner.       Influenza A by PCR NEGATIVE NEGATIVE Final   Influenza B by PCR NEGATIVE NEGATIVE Final    Comment: (NOTE) The Xpert Xpress SARS-CoV-2/FLU/RSV plus assay is intended as an aid in the diagnosis of influenza from Nasopharyngeal swab specimens and should not be used as a sole basis for treatment. Nasal washings and aspirates are unacceptable for Xpert Xpress SARS-CoV-2/FLU/RSV testing.  Fact Sheet for Patients: BloggerCourse.com  Fact Sheet for Healthcare Providers: SeriousBroker.it  This test is not yet approved or cleared by the [place] FDA and has been authorized for detection and/or diagnosis of SARS-CoV-2 by FDA under an Emergency Use Authorization (EUA). This EUA will remain in effect (  meaning this test can be used) for the duration of the COVID-19 declaration under Section 564(b)(1) of the Act, 21 U.S.C. section 360bbb-3(b)(1), unless the authorization is terminated or revoked.  Performed at [facility], [address], [place], [place] [address]   Culture, blood (Routine x 2)     Status: None (Preliminary result)   Collection Time: 07/19/21  7:43 PM   Specimen: BLOOD RIGHT FOREARM  Result Value Ref Range Status   Specimen Description BLOOD RIGHT FOREARM  Final   Special Requests   Final    BOTTLES DRAWN AEROBIC AND ANAEROBIC Blood Culture adequate volume   Culture   Final    NO GROWTH 2 DAYS Performed at [facility], [address], [place], [place] [address]    Report Status PENDING  Incomplete  Culture, blood (Routine x 2)     Status: None (Preliminary result)   Collection Time: 07/19/21  7:45 PM   Specimen: BLOOD RIGHT FOREARM  Result Value Ref Range Status   Specimen Description BLOOD RIGHT FOREARM  Final   Special Requests   Final    BOTTLES DRAWN AEROBIC AND ANAEROBIC Blood Culture adequate volume   Culture   Final    NO GROWTH 2 DAYS Performed at  [facility], [address] [place], [place] [address]    Report Status PENDING  Incomplete     Time coordinating discharge:  I have spent 35 minutes face to face with the patient and on the ward discussing the patients care, assessment, plan and disposition with other care givers. >50% of the time was devoted counseling the patient about the risks and benefits of treatment/Discharge disposition and coordinating care.   SIGNED:   Dimple Nanas, MD  [facility] 07/21/2021, 3:55 PM   If 7PM-7AM, please contact night-coverage

## 2021-07-21 NOTE — Evaluation (Signed)
Clinical/Bedside Swallow Evaluation Patient Details  Name: Katrina Good MRN: [id number] Date of Birth: 23-Oct-1971  Today's Date: 07/21/2021 Time: SLP Start Time (ACUTE ONLY): 1005 SLP Stop Time (ACUTE ONLY): 1023 SLP Time Calculation (min) (ACUTE ONLY): 18 min  Past Medical History:  Past Medical History:  Diagnosis Date   Acid reflux    Asthma    Bronchitis    Chronic heel pain, left    Diabetes mellitus    Hypertension    Seizures (HCC)    onset was 08/2020   Substance abuse (HCC)    pt denies   Suicidal thoughts    pt denies   Past Surgical History:  Past Surgical History:  Procedure Laterality Date   ABDOMINAL HYSTERECTOMY     partial   CESAREAN SECTION     x 64   HPI:  50 year old with history of DM2, GERD, HTN, substance abuse brought to the hospital for generalized weakness.  Upon admission she had also reported of stomach pain and headaches.  History was difficult to obtain as patient was "high".  She was admitted to the hospital with diagnosis of sepsis secondary to pneumonia.    Assessment / Plan / Recommendation  Clinical Impression  Clinical swallowing evaluation completed while Pt was sitting upright in bed; Pt denies swallowing difficulty. Pt consumed thin liquids, puree textures and regular textures without overt s/sx of aspiration. SLP reviewed universal aspiration precautions with the Pt. Recommend continue with current diet of D3/thin liquids. Meds are ok whole with liquids. There are no further ST needs noted at this time, ST will sign off. Thank you. SLP Visit Diagnosis: Dysphagia, unspecified (R13.10)    Aspiration Risk  Mild aspiration risk    Diet Recommendation Dysphagia 3 (Mech soft);Thin liquid   Liquid Administration via: Cup;Straw Medication Administration: Whole meds with liquid Supervision: Patient able to self feed Compensations: Minimize environmental distractions;Slow rate;Small sips/bites Postural Changes: Seated upright at 90  degrees    Other  Recommendations Oral Care Recommendations: Oral care BID    Recommendations for follow up therapy are one component of a multi-disciplinary discharge planning process, led by the attending physician.  Recommendations may be updated based on patient status, additional functional criteria and insurance authorization.  Follow up Recommendations None        Swallow Study   General Date of Onset: 07/19/21 HPI: 50 year old with history of DM2, GERD, HTN, substance abuse brought to the hospital for generalized weakness.  Upon admission she had also reported of stomach pain and headaches.  History was difficult to obtain as patient was "high".  She was admitted to the hospital with diagnosis of sepsis secondary to pneumonia. Type of Study: Bedside Swallow Evaluation Previous Swallow Assessment: none in chart Diet Prior to this Study: Dysphagia 3 (soft);Thin liquids Temperature Spikes Noted: No Respiratory Status: Room air History of Recent Intubation: No Behavior/Cognition: Alert;Cooperative Oral Cavity Assessment: Within Functional Limits Oral Care Completed by SLP: Recent completion by staff Vision: Functional for self-feeding Self-Feeding Abilities: Able to feed self Patient Positioning: Upright in bed Baseline Vocal Quality: Normal Volitional Cough: Strong Volitional Swallow: Able to elicit    Oral/Motor/Sensory Function Overall Oral Motor/Sensory Function: Within functional limits   Ice Chips Ice chips: Within functional limits   Thin Liquid Thin Liquid: Within functional limits    Nectar Thick Nectar Thick Liquid: Not tested   Honey Thick Honey Thick Liquid: Not tested   Puree Puree: Within functional limits   Solid     Solid: Within  functional limits     Neshawn Aird H. Romie Levee, CCC-SLP Speech Language Pathologist  Georgetta Haber 07/21/2021,10:45 AM

## 2021-07-21 NOTE — Progress Notes (Signed)
PROGRESS NOTE    Katrina Good  CZY:[id number] DOB: 1971-06-19 DOA: 07/19/2021 PCP: Jacquelin Hawking, PA-C   Brief Narrative:  50 year old with history of DM2, GERD, HTN, substance abuse brought to the hospital for generalized weakness.  Upon admission she had also reported of stomach pain and headaches.  History was difficult to obtain as patient was "high".  She was admitted to the hospital with diagnosis of sepsis secondary to pneumonia.  Patient was started on Rocephin and azithromycin.  CT of the head, chest abdomen pelvis performed showed right lower lobe pneumonia y.  Ammonia and TSH were unremarkable.  Over 2 days in the hospital her mentation started improving.  Speech and swallow therapy recommended dysphagia 3 diet.   Assessment & Plan:   Active Problems:   Sepsis (HCC)  Headaches Acute metabolic encephalopathy, no focal neurodeficits - This is pretty much resolved at this time.  CT of the head is negative.  Ammonia and TSH unremarkable.  Epigastric abdominal pain, improved - Likely from gastritis, lipase is normal.  CT abdomen pelvis is unremarkable.  Continue PPI twice daily for 4 weeks followed by daily and follow-up outpatient with primary care physician  Sepsis secondary to right lower lobe pneumonia - Sepsis physiology has improved.  CT of the chest is consistent with right lower lobe pneumonia - There is a question of aspiration?  Change antibiotics to oral Augmentin and azithromycin. - Bronchodilators scheduled and as needed  Substance abuse-tobacco, cocaine - UDS is positive for cocaine - Counseled to quit using.  Nicotine patch as needed  Diabetes mellitus type 2 - Sliding scale and Accu-Cheks  History of seizure disorder - On Keppra  Essential hypertension - Norvasc, as needed IV hydralazine  PT/OT  DVT prophylaxis: heparin injection 5,000 Units Start: 07/20/21 0600 SCDs Start: 07/20/21 0215 Code Status: Full code Family Communication:  Daughter  updated.   Status is: Inpatient  Remains inpatient appropriate because:Inpatient level of care appropriate due to severity of illness  Dispo: The patient is from: Home              Anticipated d/c is to: Home              Patient currently is not medically stable to d/c.   Difficult to place patient No       Subjective: Feels a little better today, she is more alert and awake answering all the questions appropriately.  Review of Systems Otherwise negative except as per HPI, including: General: Denies fever, chills, night sweats or unintended weight loss. Resp: Denies cough, wheezing, shortness of breath. Cardiac: Denies chest pain, palpitations, orthopnea, paroxysmal nocturnal dyspnea. GI: Denies abdominal pain, nausea, vomiting, diarrhea or constipation GU: Denies dysuria, frequency, hesitancy or incontinence MS: Denies muscle aches, joint pain or swelling Neuro: Denies headache, neurologic deficits (focal weakness, numbness, tingling), abnormal gait Psych: Denies anxiety, depression, SI/HI/AVH Skin: Denies new rashes or lesions ID: Denies sick contacts, exotic exposures, travel Examination:  Constitutional: Not in acute distress Respiratory: Slight bilateral coarse breath sounds Cardiovascular: Normal sinus rhythm, no rubs Abdomen: Nontender nondistended good bowel sounds Musculoskeletal: No edema noted Skin: No rashes seen Neurologic: CN 2-12 grossly intact.  And nonfocal Psychiatric: Normal judgment and insight. Alert and oriented x 3. Normal mood. Objective: Vitals:   07/20/21 2209 07/21/21 0057 07/21/21 0538 07/21/21 0924  BP: 108/63 98/61 117/72   Pulse: 95 82 91   Resp: 18 19 19    Temp: (!) 100.4 F (38 C) 98.4 F (36.9 C)  99.2 F (37.3 C)   TempSrc: Oral Oral Oral   SpO2: 98% 95% 97% 96%  Weight:      Height:        Intake/Output Summary (Last 24 hours) at 07/21/2021 1053 Last data filed at 07/21/2021 0700 Gross per 24 hour  Intake 834.72 ml  Output  400 ml  Net 434.72 ml   Filed Weights   07/19/21 1936 07/20/21 0112  Weight: 90.7 kg 88.4 kg     Data Reviewed:   CBC: Recent Labs  Lab 07/19/21 1943 07/20/21 0312 07/21/21 0516  WBC 13.1* 11.8* 8.5  NEUTROABS 10.0* 9.4*  --   HGB 15.4* 14.3 12.4  HCT 47.0* 43.6 38.4  MCV 82.6 84.0 83.1  PLT 149* 145* 139*   Basic Metabolic Panel: Recent Labs  Lab 07/19/21 1943 07/20/21 0312 07/21/21 0516  NA 130* 136 136  K 3.4* 3.6 3.6  CL 98 103 104  CO2 24 27 27   GLUCOSE 151* 125* 105*  BUN 12 9 7   CREATININE 0.89 0.85 0.63  CALCIUM 8.0* 8.2* 8.1*  MG  --  2.1 2.2   GFR: Estimated Creatinine Clearance: 99.7 mL/min (by C-G formula based on SCr of 0.63 mg/dL). Liver Function Tests: Recent Labs  Lab 07/19/21 1943 07/20/21 0312  AST 14* 14*  ALT 17 15  ALKPHOS 101 89  BILITOT 1.1 0.6  PROT 7.8 7.2  ALBUMIN 3.5 3.0*   Recent Labs  Lab 07/20/21 0312  LIPASE 35   Recent Labs  Lab 07/20/21 1202  AMMONIA 22   Coagulation Profile: Recent Labs  Lab 07/19/21 1943 07/20/21 0312  INR 1.2 1.2   Cardiac Enzymes: No results for input(s): CKTOTAL, CKMB, CKMBINDEX, TROPONINI in the last 168 hours. BNP (last 3 results) No results for input(s): PROBNP in the last 8760 hours. HbA1C: Recent Labs    07/20/21 0312  HGBA1C 6.0*   CBG: Recent Labs  Lab 07/20/21 0745 07/20/21 1107 07/20/21 1659 07/20/21 2045 07/21/21 0723  GLUCAP 134* 115* 126* 106* 103*   Lipid Profile: No results for input(s): CHOL, HDL, LDLCALC, TRIG, CHOLHDL, LDLDIRECT in the last 72 hours. Thyroid Function Tests: Recent Labs    07/20/21 1202  TSH 1.332   Anemia Panel: No results for input(s): VITAMINB12, FOLATE, FERRITIN, TIBC, IRON, RETICCTPCT in the last 72 hours. Sepsis Labs: Recent Labs  Lab 07/19/21 1943 07/19/21 2132 07/20/21 0312  PROCALCITON  --   --  <0.10  LATICACIDVEN 0.9 1.2  --     Recent Results (from the past 240 hour(s))  Resp Panel by RT-PCR (Flu A&B, Covid)  Nasopharyngeal Swab     Status: None   Collection Time: 07/19/21  7:42 PM   Specimen: Nasopharyngeal Swab; Nasopharyngeal(NP) swabs in vial transport medium  Result Value Ref Range Status   SARS Coronavirus 2 by RT PCR NEGATIVE NEGATIVE Final    Comment: (NOTE) SARS-CoV-2 target nucleic acids are NOT DETECTED.  The SARS-CoV-2 RNA is generally detectable in upper respiratory specimens during the acute phase of infection. The lowest concentration of SARS-CoV-2 viral copies this assay can detect is 138 copies/mL. A negative result does not preclude SARS-Cov-2 infection and should not be used as the sole basis for treatment or other patient management decisions. A negative result may occur with  improper specimen collection/handling, submission of specimen other than nasopharyngeal swab, presence of viral mutation(s) within the areas targeted by this assay, and inadequate number of viral copies(<138 copies/mL). A negative result must be combined with clinical  observations, patient history, and epidemiological information. The expected result is Negative.  Fact Sheet for Patients:  BloggerCourse.com  Fact Sheet for Healthcare Providers:  SeriousBroker.it  This test is no t yet approved or cleared by the [place] FDA and  has been authorized for detection and/or diagnosis of SARS-CoV-2 by FDA under an Emergency Use Authorization (EUA). This EUA will remain  in effect (meaning this test can be used) for the duration of the COVID-19 declaration under Section 564(b)(1) of the Act, 21 U.S.C.section 360bbb-3(b)(1), unless the authorization is terminated  or revoked sooner.       Influenza A by PCR NEGATIVE NEGATIVE Final   Influenza B by PCR NEGATIVE NEGATIVE Final    Comment: (NOTE) The Xpert Xpress SARS-CoV-2/FLU/RSV plus assay is intended as an aid in the diagnosis of influenza from Nasopharyngeal swab specimens and should not be  used as a sole basis for treatment. Nasal washings and aspirates are unacceptable for Xpert Xpress SARS-CoV-2/FLU/RSV testing.  Fact Sheet for Patients: BloggerCourse.com  Fact Sheet for Healthcare Providers: SeriousBroker.it  This test is not yet approved or cleared by the [place] FDA and has been authorized for detection and/or diagnosis of SARS-CoV-2 by FDA under an Emergency Use Authorization (EUA). This EUA will remain in effect (meaning this test can be used) for the duration of the COVID-19 declaration under Section 564(b)(1) of the Act, 21 U.S.C. section 360bbb-3(b)(1), unless the authorization is terminated or revoked.  Performed at [facility], [address], [place], [place] [address]   Culture, blood (Routine x 2)     Status: None (Preliminary result)   Collection Time: 07/19/21  7:43 PM   Specimen: BLOOD RIGHT FOREARM  Result Value Ref Range Status   Specimen Description BLOOD RIGHT FOREARM  Final   Special Requests   Final    BOTTLES DRAWN AEROBIC AND ANAEROBIC Blood Culture adequate volume   Culture   Final    NO GROWTH 2 DAYS Performed at [facility], [address], [place], [place] [address]    Report Status PENDING  Incomplete  Culture, blood (Routine x 2)     Status: None (Preliminary result)   Collection Time: 07/19/21  7:45 PM   Specimen: BLOOD RIGHT FOREARM  Result Value Ref Range Status   Specimen Description BLOOD RIGHT FOREARM  Final   Special Requests   Final    BOTTLES DRAWN AEROBIC AND ANAEROBIC Blood Culture adequate volume   Culture   Final    NO GROWTH 2 DAYS Performed at [facility], [address] [place], [place] [address]    Report Status PENDING  Incomplete         Radiology Studies: CT HEAD WO CONTRAST ( )  Result Date: 07/20/2021 CLINICAL DATA:  Mental status change, unknown cause EXAM: CT HEAD WITHOUT CONTRAST TECHNIQUE: Contiguous axial images were obtained from the  base of the skull through the vertex without intravenous contrast. COMPARISON:  CT head August 28, 2020. FINDINGS: Brain: No evidence of acute large vascular territory infarction, hemorrhage, hydrocephalus, extra-axial collection or mass lesion/mass effect. Vascular: No hyperdense vessel identified. Calcific intracranial atherosclerosis. Skull: No acute fracture. Sinuses/Orbits: Visualized sinuses are clear. Other: No mastoid effusions. IMPRESSION: No evidence of acute intracranial abnormality. Electronically Signed   By: Feliberto Harts M.D.   On: 07/20/2021 13:52   DG Chest Port 1 View  Result Date: 07/19/2021 CLINICAL DATA:  Possible sepsis. EXAM: PORTABLE CHEST 1 VIEW COMPARISON:  CT of the chest 01/05/2021.  Chest x-ray 08/28/2020.  FINDINGS: The heart is upper limits of normal in size. Mediastinal silhouette is within normal limits. There are linear bands of atelectasis in the left mid lung. There is a fate rounded density projecting over the right lower lung 6.7 cm. There is a small right pleural effusion. There is no pneumothorax. No acute fractures are seen. IMPRESSION: 1. Rounded density projecting over the right lower lung worrisome for focal parenchymal process such as infection or mass. Consider CT. 2. Bilateral atelectasis. Electronically Signed   By: Darliss Cheney M.D.   On: 07/19/2021 20:21   CT CHEST ABDOMEN PELVIS WO CONTRAST  Result Date: 07/20/2021 CLINICAL DATA:  Epigastric and abdominal pain. Abnormal chest radiography EXAM: CT CHEST, ABDOMEN AND PELVIS WITHOUT CONTRAST TECHNIQUE: Multidetector CT imaging of the chest, abdomen and pelvis was performed following the standard protocol without IV contrast. COMPARISON:  Chest radiography yesterday. FINDINGS: CT CHEST FINDINGS Cardiovascular: Heart size is normal. There is coronary artery calcification in the left system. No aortic atherosclerotic calcification is seen. Mediastinum/Nodes: No mediastinal or hilar mass or lymphadenopathy.  Lungs/Pleura: There is bronchopneumonia in the right upper lobe and more extensively in the right lower lobe. Right middle lobe remains clear. Small amount of dependent pleural fluid. Left lung is clear. Musculoskeletal: Negative CT ABDOMEN PELVIS FINDINGS Hepatobiliary: Liver appears normal without contrast. No calcified gallstones. Pancreas: Normal Spleen: Normal Adrenals/Urinary Tract: Adrenal glands are normal. Kidneys are normal. Bladder is normal. Stomach/Bowel: Stomach and small intestine are normal. No colon pathology. Vascular/Lymphatic: Aortic atherosclerosis. No aneurysm. IVC is normal. No adenopathy. Reproductive: Previous hysterectomy.  No pelvic mass. Other: No free fluid or air. Musculoskeletal: Ordinary lower lumbar degenerative changes. IMPRESSION: Right lung pneumonia, more extensive within the right lower lobe than the right upper lobe. Small amount of layering pleural fluid on the right. Left lung is clear. Aortic atherosclerosis. Coronary artery calcification in the left system. No significant abdominal or pelvic finding. Electronically Signed   By: Paulina Fusi M.D.   On: 07/20/2021 14:09        Scheduled Meds:  amLODipine  5 mg Oral Daily   aspirin EC  243 mg Oral Daily   heparin  5,000 Units Subcutaneous Q8H   insulin aspart  0-15 Units Subcutaneous TID WC   insulin aspart  0-5 Units Subcutaneous QHS   ipratropium-albuterol  3 mL Nebulization BID   levETIRAcetam  500 mg Oral BID   nicotine  21 mg Transdermal Daily   pantoprazole  40 mg Oral BID AC   Continuous Infusions:  azithromycin 500 mg (07/20/21 2209)   cefTRIAXone (ROCEPHIN)  IV 2 g (07/20/21 2049)     LOS: 2 days   Time spent= 35 mins    Gevorg Brum Joline Maxcy, MD [facility]  If 7PM-7AM, please contact night-coverage  07/21/2021, 10:53 AM

## 2021-07-21 NOTE — Progress Notes (Signed)
Patient left AMA, upset because she wanted to smoke,informed patient of the smoke free policy,also other alternatives were applied such as nicotine patch,patient states" this does not help,just let me go outside".Dr Nelson Chimes notified.Marland Kitchen

## 2021-07-22 ENCOUNTER — Telehealth: Payer: Self-pay | Admitting: Physician Assistant

## 2021-07-22 LAB — URINE CULTURE: Culture: NO GROWTH

## 2021-07-22 NOTE — Telephone Encounter (Signed)
Patient came to office after leaving AMA from hospital wanting to be seen. After speaking with provider, patient was informed she need to return to ER for treatment.

## 2021-07-22 NOTE — Congregational Nurse Program (Signed)
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  Client states she left the hospital at [facility] yesterday after going in Sunday for complaints of fever and cough. Client left due to family emergency per client. She states she was not told a diagnosis that she had lots of tests and states she had to leave to "check on her mother".  Client reports that she called [facility] and she was told that provider states she needs to go back to the emergency room to be treated for her  symptoms.  [facility] to confirm and client was told to go back to the emergency room today to be treated. Client asked "what do they think I have?" Client reports she was not told results of her tests. Informed client that I cannot diagnose that provider MD/PA or NP would need to review her tests with her and discuss her diagnosis and treatment. Reinforced that her primary care provider did recommend she go back for treatment.  Client reports she will go back today after she goes home to make sure her "momma is okay and to call my son"  Discussed reasons client would need to access EMS such as increasing shortness of breath, fever, any new symptoms, chest pains. Client states, "I know  I am sick" recommended client letting ER know her family situation and the reason she left before treatment yesterday.  Will plan to call and follow up with client after she is seen and treated.  Client provided with a resource booklet to contact ADTS services for possible care services for her mother.  Francee Nodal RN [employer]

## 2021-07-23 ENCOUNTER — Telehealth: Payer: Self-pay | Admitting: Physician Assistant

## 2021-07-23 LAB — LEGIONELLA PNEUMOPHILA SEROGP 1 UR AG: L. pneumophila Serogp 1 Ur Ag: POSITIVE

## 2021-07-23 NOTE — Telephone Encounter (Signed)
I spoke with Dr Nelson Chimes who agreed that pt could be treated as outpatient for her legionella provided she was stable and not hypoxic.  Pt was called and says she can't come in today because she is in court today.  Front desk told pt that if she is unable to have appointment today she should go to ER to get treatment as our office is not open on Fridays and provider is out of office on Monday and she should not wait until Tuesday for treatment.

## 2021-07-24 ENCOUNTER — Emergency Department (HOSPITAL_COMMUNITY): Payer: Self-pay

## 2021-07-24 ENCOUNTER — Emergency Department (HOSPITAL_COMMUNITY)
Admission: EM | Admit: 2021-07-24 | Discharge: 2021-07-25 | Disposition: A | Discharge status: Home / Self Care | Payer: Self-pay | Attending: Emergency Medicine | Admitting: Emergency Medicine

## 2021-07-24 ENCOUNTER — Other Ambulatory Visit: Payer: Self-pay

## 2021-07-24 ENCOUNTER — Encounter (HOSPITAL_COMMUNITY): Payer: Self-pay

## 2021-07-24 DIAGNOSIS — E119 Type 2 diabetes mellitus without complications: Secondary | ICD-10-CM | POA: Insufficient documentation

## 2021-07-24 DIAGNOSIS — J45909 Unspecified asthma, uncomplicated: Secondary | ICD-10-CM | POA: Insufficient documentation

## 2021-07-24 DIAGNOSIS — F17210 Nicotine dependence, cigarettes, uncomplicated: Secondary | ICD-10-CM | POA: Insufficient documentation

## 2021-07-24 DIAGNOSIS — I10 Essential (primary) hypertension: Secondary | ICD-10-CM | POA: Insufficient documentation

## 2021-07-24 DIAGNOSIS — J189 Pneumonia, unspecified organism: Secondary | ICD-10-CM | POA: Insufficient documentation

## 2021-07-24 LAB — CBC WITH DIFFERENTIAL/PLATELET
Abs Immature Granulocytes: 0.23 10*3/uL — ABNORMAL HIGH (ref 0.00–0.07)
Basophils Absolute: 0.1 10*3/uL (ref 0.0–0.1)
Basophils Relative: 1 %
Eosinophils Absolute: 0.2 10*3/uL (ref 0.0–0.5)
Eosinophils Relative: 2 %
HCT: 40.8 % (ref 36.0–46.0)
Hemoglobin: 13.3 g/dL (ref 12.0–15.0)
Immature Granulocytes: 3 %
Lymphocytes Relative: 22 %
Lymphs Abs: 1.8 10*3/uL (ref 0.7–4.0)
MCH: 27.5 pg (ref 26.0–34.0)
MCHC: 32.6 g/dL (ref 30.0–36.0)
MCV: 84.5 fL (ref 80.0–100.0)
Monocytes Absolute: 0.6 10*3/uL (ref 0.1–1.0)
Monocytes Relative: 7 %
Neutro Abs: 5.4 10*3/uL (ref 1.7–7.7)
Neutrophils Relative %: 65 %
Platelets: 339 10*3/uL (ref 150–400)
RBC: 4.83 MIL/uL (ref 3.87–5.11)
RDW: 15.6 % — ABNORMAL HIGH (ref 11.5–15.5)
WBC: 8.2 10*3/uL (ref 4.0–10.5)
nRBC: 0 % (ref 0.0–0.2)

## 2021-07-24 LAB — CULTURE, BLOOD (ROUTINE X 2)
Culture: NO GROWTH
Culture: NO GROWTH
Special Requests: ADEQUATE
Special Requests: ADEQUATE

## 2021-07-24 LAB — CULTURE, RESPIRATORY W GRAM STAIN

## 2021-07-24 LAB — BASIC METABOLIC PANEL
Anion gap: 6 (ref 5–15)
BUN: 16 mg/dL (ref 6–20)
CO2: 29 mmol/L (ref 22–32)
Calcium: 8.4 mg/dL — ABNORMAL LOW (ref 8.9–10.3)
Chloride: 105 mmol/L (ref 98–111)
Creatinine, Ser: 0.75 mg/dL (ref 0.44–1.00)
GFR, Estimated: >60 mL/min (ref >60)
Glucose, Bld: 132 mg/dL — ABNORMAL HIGH (ref 70–99)
Potassium: 3.3 mmol/L — ABNORMAL LOW (ref 3.5–5.1)
Sodium: 140 mmol/L (ref 135–145)

## 2021-07-24 MED ORDER — AMOXICILLIN-POT CLAVULANATE 875-125 MG PO TABS: 1.0000 | ORAL_TABLET | Freq: Two times a day (BID) | ORAL | 0 refills | Status: AC

## 2021-07-24 MED ORDER — AMOXICILLIN-POT CLAVULANATE 875-125 MG PO TABS
1.0000 | ORAL_TABLET | Freq: Once | ORAL | Status: AC
  Administered 2021-07-25: 1 via ORAL
  Filled 2021-07-24: qty 1

## 2021-07-24 NOTE — Discharge Instructions (Addendum)
You were evaluated in the Emergency Department and after careful evaluation, we did not find any emergent condition requiring admission or further testing in the hospital.  Your exam/testing today was overall reassuring.  X-ray showing some lingering pneumonia.  Please take the Augmentin antibiotic as directed and follow-up closely with your primary care doctor  Please return to the Emergency Department if you experience any worsening of your condition such as return of fever or any shortness of breath.  Thank you for allowing [place] to be a part of your care.

## 2021-07-24 NOTE — ED Notes (Signed)
Patient in room at this time. Patient very sleepy. Patient on cardiac monitor and EKG done. Vitals updated

## 2021-07-24 NOTE — ED Provider Notes (Signed)
[facility]-EMERGENCY DEPT [facility] Emergency Department Provider Note MRN:  [id number]  Arrival date & time: 07/24/21     Chief Complaint   Pneumonia History of Present Illness   Katrina Good is a 50 y.o. year-old female with a history of seizures, diabetes presenting to the ED with chief complaint of pneumonia.  Patient follow-up with primary care doctor today and explained that she was recently in the hospital and diagnosed with pneumonia but left AGAINST MEDICAL ADVICE.  Doctor advised to come back to the emergency department for continued care and possibly some more antibiotics.  She denies any shortness of breath, no fever, no lingering cough, overall feels like her normal self.  Has some chronic pain that is unchanged.  No chest pain, no abdominal pain, no other complaints.  Review of Systems  A complete 10 system review of systems was obtained and all systems are negative except as noted in the HPI and PMH.   Patient's Health History    Past Medical History:  Diagnosis Date   Acid reflux    Asthma    Bronchitis    Chronic heel pain, left    Diabetes mellitus    Hypertension    Seizures (HCC)    onset was 08/2020   Substance abuse (HCC)    pt denies   Suicidal thoughts    pt denies    Past Surgical History:  Procedure Laterality Date   ABDOMINAL HYSTERECTOMY     partial   CESAREAN SECTION     x 2    Family History  Problem Relation Age of Onset   Diabetes Mother    Hypertension Mother    Heart failure Mother    Seizures Father    Cancer Father    Hypertension Father    Heart failure Father    Asthma Father     Social History   Socioeconomic History   Marital status: Single    Spouse name: Not on file   Number of children: 2   Years of education: 11   Highest education level: Not on file  Occupational History    Employer: UNEMPLOYED  Tobacco Use   Smoking status: Every Day    Packs/day: 2.00    Years: 33.00    Pack years: 66.00    Types:  Cigarettes   Smokeless tobacco: Never   Tobacco comments:    2-3 packs  Vaping Use   Vaping Use: Never used  Substance and Sexual Activity   Alcohol use: Not Currently   Drug use: Yes    Types: Marijuana, "Crack" cocaine, Cocaine    Comment: 9/15/20222 used yesterday- crack cocaine; uses crack (but not cocaine).  not MJ user   Sexual activity: Not Currently    Birth control/protection: Surgical  Other Topics Concern   Not on file  Social History Narrative   01/14/21 lives with mother   Social Determinants of Health   Financial Resource Strain: Not on file  Food Insecurity: Not on file  Transportation Needs: Unmet Transportation Needs   Lack of Transportation (Medical): Yes   Lack of Transportation (Non-Medical): Yes  Physical Activity: Not on file  Stress: Not on file  Social Connections: Not on file  Intimate Partner Violence: Not on file     Physical Exam   Vitals:   07/24/21 2300 07/24/21 2330  BP: 130/74 106/89  Pulse: 93 76  Resp: (!) 27 20  Temp:    SpO2: 97% 94%    CONSTITUTIONAL: Well-appearing, NAD  NEURO:  Alert and oriented x 3, no focal deficits EYES:  eyes equal and reactive ENT/NECK:  no LAD, no JVD CARDIO: Regular rate, well-perfused, normal S1 and S2 PULM:  CTAB no wheezing or rhonchi GI/GU:  normal bowel sounds, non-distended, non-tender MSK/SPINE:  No gross deformities, no edema SKIN:  no rash, atraumatic PSYCH:  Appropriate speech and behavior  *Additional and/or pertinent findings included in MDM below  Diagnostic and Interventional Summary    EKG Interpretation  Date/Time:    Ventricular Rate:    PR Interval:    QRS Duration:   QT Interval:    QTC Calculation:   R Axis:     Text Interpretation:         Labs Reviewed  BASIC METABOLIC PANEL - Abnormal; Notable for the following components:      Result Value   Potassium 3.3 (*)    Glucose, Bld 132 (*)    Calcium 8.4 (*)    All other components within normal limits  CBC WITH  DIFFERENTIAL/PLATELET - Abnormal; Notable for the following components:   RDW 15.6 (*)    Abs Immature Granulocytes 0.23 (*)    All other components within normal limits    DG Chest Portable 1 View  Final Result      Medications  amoxicillin-clavulanate (AUGMENTIN) 875-125 MG per tablet 1 tablet (has no administration in time range)     Procedures  /  Critical Care Procedures  ED Course and Medical Decision Making  I have reviewed the triage vital signs, the nursing notes, and pertinent available records from the EMR.  Listed above are laboratory and imaging tests that I personally ordered, reviewed, and interpreted and then considered in my medical decision making (see below for details).  Patient is sleeping comfortably, wakes easily, no increased work of breathing, normal vital signs, no hypoxia, lungs overall clear.  She denies any shortness of breath, no fever recently.  Was admitted to the hospital in the setting of suspicion for polysubstance abuse and overdose and was treated for aspiration pneumonia.  She left AGAINST MEDICAL ADVICE and was not given any prescription for antibiotics.  The labs today are reassuring with no leukocytosis, the x-ray is showing some lingering signs of airspace disease.  Given her well-appearing nature, there is no indication for admission, patient is appropriate for outpatient antibiotics and strict return precautions.       Elmer Sow. Pilar Plate, MD [facility] Emergency Medicine [facility] [email or web address]  Final Clinical Impressions(s) / ED Diagnoses     ICD-10-CM   1. Pneumonia due to infectious organism, unspecified laterality, unspecified part of lung  J18.9       ED Discharge Orders          Ordered    amoxicillin-clavulanate (AUGMENTIN) 875-125 MG tablet  Every 12 hours        07/24/21 2355             Discharge Instructions Discussed with and Provided to Patient:    Discharge Instructions      You were  evaluated in the Emergency Department and after careful evaluation, we did not find any emergent condition requiring admission or further testing in the hospital.  Your exam/testing today was overall reassuring.  X-ray showing some lingering pneumonia.  Please take the Augmentin antibiotic as directed and follow-up closely with your primary care doctor  Please return to the Emergency Department if you experience any worsening of your condition such as return of fever  or any shortness of breath.  Thank you for allowing [place] to be a part of your care.        Sabas Sous, MD 07/24/21 [phone]

## 2021-07-24 NOTE — ED Triage Notes (Signed)
Pt arrived via POV c/o persistent pneumonia. Pt reports her PCP called her and told her she needed to come back to the hospital to be admitted for treatment. Pt recently admitted and left AMA for same complaint.

## 2021-07-28 ENCOUNTER — Telehealth: Payer: Self-pay | Admitting: Physician Assistant

## 2021-07-28 NOTE — Telephone Encounter (Signed)
Attempted to contact pt re Legionella.  She went to ER over the weekend but was put on augmentin which will not cover the legionella.  Attempted to contact pt using both of her phone numbers twice today with neither phone is working.

## 2021-07-30 NOTE — Telephone Encounter (Signed)
Again attempted to contact pt.  Both of her phone numbers don't work.  Attempted to contact her neighbor (as pt had left this as a contact) but there was no answer.  Attempted to contact pt's mother but the number was non-functional.

## 2021-07-30 NOTE — Telephone Encounter (Signed)
Due to being unable to contact pt by telephone, a letter was sent via [employer] requesting that pt contact office asap.

## 2021-08-06 ENCOUNTER — Emergency Department (HOSPITAL_COMMUNITY)
Admission: EM | Admit: 2021-08-06 | Discharge: 2021-08-06 | Disposition: A | Discharge status: Home / Self Care | Payer: No Typology Code available for payment source | Attending: Emergency Medicine | Admitting: Emergency Medicine

## 2021-08-06 ENCOUNTER — Other Ambulatory Visit: Payer: Self-pay

## 2021-08-06 ENCOUNTER — Emergency Department (HOSPITAL_COMMUNITY): Payer: No Typology Code available for payment source

## 2021-08-06 ENCOUNTER — Encounter (HOSPITAL_COMMUNITY): Payer: Self-pay | Admitting: *Deleted

## 2021-08-06 DIAGNOSIS — M542 Cervicalgia: Secondary | ICD-10-CM | POA: Insufficient documentation

## 2021-08-06 DIAGNOSIS — Z7982 Long term (current) use of aspirin: Secondary | ICD-10-CM | POA: Diagnosis not present

## 2021-08-06 DIAGNOSIS — F17210 Nicotine dependence, cigarettes, uncomplicated: Secondary | ICD-10-CM | POA: Insufficient documentation

## 2021-08-06 DIAGNOSIS — J45909 Unspecified asthma, uncomplicated: Secondary | ICD-10-CM | POA: Insufficient documentation

## 2021-08-06 DIAGNOSIS — Y92410 Unspecified street and highway as the place of occurrence of the external cause: Secondary | ICD-10-CM | POA: Insufficient documentation

## 2021-08-06 DIAGNOSIS — E119 Type 2 diabetes mellitus without complications: Secondary | ICD-10-CM | POA: Diagnosis not present

## 2021-08-06 DIAGNOSIS — I10 Essential (primary) hypertension: Secondary | ICD-10-CM | POA: Diagnosis not present

## 2021-08-06 DIAGNOSIS — R109 Unspecified abdominal pain: Secondary | ICD-10-CM | POA: Insufficient documentation

## 2021-08-06 DIAGNOSIS — M546 Pain in thoracic spine: Secondary | ICD-10-CM | POA: Insufficient documentation

## 2021-08-06 DIAGNOSIS — Z79899 Other long term (current) drug therapy: Secondary | ICD-10-CM | POA: Insufficient documentation

## 2021-08-06 DIAGNOSIS — R519 Headache, unspecified: Secondary | ICD-10-CM | POA: Diagnosis not present

## 2021-08-06 DIAGNOSIS — R569 Unspecified convulsions: Secondary | ICD-10-CM | POA: Diagnosis not present

## 2021-08-06 LAB — CBC WITH DIFFERENTIAL/PLATELET
Basophils Absolute: 0 10*3/uL (ref 0.0–0.1)
Basophils Relative: 0 %
Eosinophils Absolute: 0.4 10*3/uL (ref 0.0–0.5)
Eosinophils Relative: 5 %
HCT: 42.9 % (ref 36.0–46.0)
Hemoglobin: 13.7 g/dL (ref 12.0–15.0)
Lymphocytes Relative: 29 %
Lymphs Abs: 2.1 10*3/uL (ref 0.7–4.0)
MCH: 27.1 pg (ref 26.0–34.0)
MCHC: 31.9 g/dL (ref 30.0–36.0)
MCV: 85 fL (ref 80.0–100.0)
Monocytes Absolute: 0.6 10*3/uL (ref 0.1–1.0)
Monocytes Relative: 8 %
Neutro Abs: 4.2 10*3/uL (ref 1.7–7.7)
Neutrophils Relative %: 58 %
Platelets: 212 10*3/uL (ref 150–400)
RBC: 5.05 MIL/uL (ref 3.87–5.11)
RDW: 15.8 % — ABNORMAL HIGH (ref 11.5–15.5)
WBC: 7.3 10*3/uL (ref 4.0–10.5)
nRBC: 0 % (ref 0.0–0.2)

## 2021-08-06 LAB — COMPREHENSIVE METABOLIC PANEL
ALT: 15 U/L (ref 0–44)
AST: 13 U/L — ABNORMAL LOW (ref 15–41)
Albumin: 3.5 g/dL (ref 3.5–5.0)
Alkaline Phosphatase: 103 U/L (ref 38–126)
Anion gap: 7 (ref 5–15)
BUN: 15 mg/dL (ref 6–20)
CO2: 25 mmol/L (ref 22–32)
Calcium: 8.6 mg/dL — ABNORMAL LOW (ref 8.9–10.3)
Chloride: 107 mmol/L (ref 98–111)
Creatinine, Ser: 0.65 mg/dL (ref 0.44–1.00)
GFR, Estimated: >60 mL/min (ref >60)
Glucose, Bld: 135 mg/dL — ABNORMAL HIGH (ref 70–99)
Potassium: 3.7 mmol/L (ref 3.5–5.1)
Sodium: 139 mmol/L (ref 135–145)
Total Bilirubin: 0.7 mg/dL (ref 0.3–1.2)
Total Protein: 7 g/dL (ref 6.5–8.1)

## 2021-08-06 LAB — URINALYSIS, ROUTINE W REFLEX MICROSCOPIC
Bilirubin Urine: NEGATIVE
Glucose, UA: NEGATIVE mg/dL
Hgb urine dipstick: NEGATIVE
Ketones, ur: NEGATIVE mg/dL
Leukocytes,Ua: NEGATIVE
Nitrite: NEGATIVE
Protein, ur: NEGATIVE mg/dL
Specific Gravity, Urine: 1.043 — ABNORMAL HIGH (ref 1.005–1.030)
pH: 6 (ref 5.0–8.0)

## 2021-08-06 LAB — RAPID URINE DRUG SCREEN, HOSP PERFORMED
Amphetamines: NOT DETECTED
Barbiturates: NOT DETECTED
Benzodiazepines: POSITIVE — AB
Cocaine: POSITIVE — AB
Opiates: NOT DETECTED
Tetrahydrocannabinol: NOT DETECTED

## 2021-08-06 LAB — MAGNESIUM: Magnesium: 2.1 mg/dL (ref 1.7–2.4)

## 2021-08-06 LAB — CBG MONITORING, ED: Glucose-Capillary: 149 mg/dL — ABNORMAL HIGH (ref 70–99)

## 2021-08-06 LAB — ETHANOL: Alcohol, Ethyl (B): <10 mg/dL (ref <10)

## 2021-08-06 MED ORDER — LEVETIRACETAM 1000 MG PO TABS: 1000.0000 mg | ORAL_TABLET | Freq: Two times a day (BID) | ORAL | 1 refills | Status: DC

## 2021-08-06 MED ORDER — LEVETIRACETAM IN NACL 1000 MG/100ML IV SOLN
1000.0000 mg | Freq: Once | INTRAVENOUS | Status: AC
  Administered 2021-08-06: 1000 mg via INTRAVENOUS
  Filled 2021-08-06: qty 100

## 2021-08-06 MED ORDER — IOHEXOL 300 MG/ML  SOLN
100.0000 mL | Freq: Once | INTRAMUSCULAR | Status: AC | PRN
  Administered 2021-08-06: 100 mL via INTRAVENOUS

## 2021-08-06 MED ORDER — LORAZEPAM 2 MG/ML IJ SOLN: 2.0000 mg | Freq: Once | INTRAMUSCULAR | Status: AC

## 2021-08-06 MED ORDER — LORAZEPAM 2 MG/ML IJ SOLN
INTRAMUSCULAR | Status: AC
  Administered 2021-08-06: 2 mg via INTRAVENOUS
  Filled 2021-08-06: qty 1

## 2021-08-06 NOTE — ED Notes (Signed)
Assisted pt to bathroom. Walked well with assistance, but some weakness noted. More alert and oriented at this time. Urine sample obtained. Husband at bedside. Pt upper body very sore in shoulders and ribs.
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
## 2021-08-06 NOTE — ED Notes (Signed)
Pt noted to with mild convulsing, jaw tightly clench, unresponsive to verbal/tactile stimuli. EDP called to room. New orders for ativan 2mg , given IV. Pt postictal at this time. Seizure pads in place. Suction at bedside. C-collar remains in place to stabilize c-spine at this time. Pt log rolled to side lying position .
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
## 2021-08-06 NOTE — Discharge Instructions (Addendum)
Your Keppra is being changed/increased.  It is important to follow-up with your neurologist as instructed.  Call them tomorrow.  You may not drive until cleared by them.  - According to [place] law, you can not drive unless you are seizure / syncope free for at least 6 months and under physician's care.    - Please maintain precautions. Do not participate in activities where a loss of awareness could harm you or someone else. No swimming alone, no tub bathing, no hot tubs, no driving, no operating motorized vehicles (cars, ATVs, motocycles, etc), lawnmowers, power tools or firearms. No standing at heights, such as rooftops, ladders or stairs. Avoid hot objects such as stoves, heaters, open fires. Wear a helmet when riding a bicycle, scooter, skateboard, etc. and avoid areas of traffic. Set your water heater to 120 degrees or less.

## 2021-08-12 ENCOUNTER — Telehealth: Payer: Self-pay | Admitting: Physician Assistant

## 2021-08-12 MED ORDER — LEVOFLOXACIN 750 MG PO TABS: 750.0000 mg | ORAL_TABLET | Freq: Every day | ORAL | 0 refills | Status: AC

## 2021-08-12 NOTE — Telephone Encounter (Signed)
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  Have been unsuccessfully trying to speak with her since 9/22.  She did go to ER on 9/23 as recommended but ER doctor did not treat the legionella but instead treated CAP with augmentin.  Discussed with pt that she needs levaquin to treat the legionella.  She states understanding.  Rx for levaquin 750mg  qd x 10 days sent to [facility].  Pt to follow up for routine follow up in office 09/01/21.   She is to contact office (or go to ER) for any sob or other issues.

## 2021-08-25 ENCOUNTER — Ambulatory Visit: Payer: Self-pay | Admitting: Physician Assistant

## 2021-08-26 ENCOUNTER — Telehealth: Payer: Self-pay | Admitting: Diagnostic Neuroimaging

## 2021-08-26 NOTE — Telephone Encounter (Signed)
Called # listed as home for patient, her mother and daughter, all on [place]. Got Message: call cannot be completed. Will not call neighbor's # as that was neighbor's request in call from patient earlier today. If patient call back she can be scheduled for a FU with Dr Marjory Lies or Shawnie Dapper NP. Please obtain # where she can be reached.

## 2021-08-26 NOTE — Telephone Encounter (Signed)
Pt called needing to speak to the RN regarding her levETIRAcetam (KEPPRA) 1000 MG tablet that was increased when she went to the ER. Pt states she has been having more a lot more seizures frequently. Pt does not have a phone, she was calling from a neighbors phone but stated that her neighbor did not want to give phone number out. Pt was put on hold so I can reach out to RN's and the pt hung up. Number where pt was calling from has been documented.

## 2021-09-01 ENCOUNTER — Ambulatory Visit: Payer: Self-pay | Admitting: Physician Assistant

## 2021-09-01 NOTE — Telephone Encounter (Signed)
Called home #, invalid #. Called mobile #, lady answered, stated patient is not home and doesn't have a phone where I can reach her. She stated patient will be back later. I gave her office  # and asked to have patient call me. She  verbalized understanding, appreciation.

## 2021-09-02 NOTE — Telephone Encounter (Signed)
Called mobile #, no answer and voice MB full.
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
## 2021-09-09 NOTE — Telephone Encounter (Signed)
That is fine. Thank you for letting me know!

## 2021-09-14 ENCOUNTER — Other Ambulatory Visit: Payer: Self-pay | Admitting: Physician Assistant

## 2021-09-14 ENCOUNTER — Ambulatory Visit (INDEPENDENT_AMBULATORY_CARE_PROVIDER_SITE_OTHER): Payer: Self-pay | Admitting: Adult Health

## 2021-09-14 ENCOUNTER — Encounter: Payer: Self-pay | Admitting: Adult Health

## 2021-09-14 VITALS — BP 138/93 | HR 84 | Ht 69.0 in | Wt 195.0 lb

## 2021-09-14 DIAGNOSIS — G40909 Epilepsy, unspecified, not intractable, without status epilepticus: Secondary | ICD-10-CM

## 2021-09-14 MED ORDER — LEVETIRACETAM 1000 MG PO TABS: ORAL_TABLET | ORAL | 5 refills | Status: DC
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
## 2021-09-14 NOTE — Patient Instructions (Addendum)
Your Plan:  Decrease keppra morning dose to 500mg  (half tab) and continue 1000mg  tablet (full tablet) at night  Per [place] law, no driving for 6 months after seizure activity    Follow up with Amy, NP as scheduled or call sooner if needed      Thank you for coming to see at [facility]. I hope we have been able to provide you high quality care today.  You may receive a patient satisfaction survey over the next few weeks. We would appreciate your feedback and comments so that we may continue to improve ourselves and the health of our patients.

## 2021-09-16 ENCOUNTER — Telehealth: Payer: Self-pay

## 2021-09-16 NOTE — Telephone Encounter (Signed)
called client. she states someone was supposed to arrange transportation for her DA evaluation that is tomorrow in [place]. no note in fhases and checked with [facility] transportation and that was not arranged. Recieved address of [address] [place]. called [facility] transportation and arranged with [place] and [place]  transportation associates to arrive at 1pm on 09/17/21. client notified it was arranged. she states if she missed it she would have to wait and start again for her disability.  Client showing expired in CC was awaiting per Leah's note for proof of no [employer]. client states that [facility] had that letter. Joylene John that she received an email from [place] provider [facility] about her [facility] not active and she has sent that today to [facility].  update. approved for [facility]ist until 07/02/22 and Care [facility] until sept 2023. will remain with [facility]. Updated Fhases to reflect client is enrolled until 07/22/22.  Francee Nodal RN [employer]

## 2021-09-17 ENCOUNTER — Telehealth: Payer: Self-pay | Admitting: Physician Assistant

## 2021-09-17 ENCOUNTER — Telehealth: Payer: Self-pay

## 2021-09-17 NOTE — Telephone Encounter (Signed)
Pt was called back about her request to have 3 month supply of meds sent to medassist.    She has finished enrollment with care connect after cancelling her insurance recently.  Pt did not schedule appointment to re-establish care.   Discussed with pt that she needs to come into the office before another 3 month supply can be sent.  She was offered to have 1 month supply sent to local pharmacy but she declined.  Appt was made for her 09/28/21.  Appt was not scheduled sooner because she doesn't know how long it takes to arrange transportation.

## 2021-09-17 NOTE — Telephone Encounter (Signed)
Call from pt requesting Rx sent to [facility]ist, informed to expect call back regarding request.

## 2021-09-28 ENCOUNTER — Ambulatory Visit: Payer: Self-pay | Admitting: Physician Assistant

## 2021-09-29 ENCOUNTER — Ambulatory Visit: Payer: Self-pay | Admitting: Physician Assistant

## 2021-09-29 ENCOUNTER — Telehealth: Payer: Self-pay | Admitting: Physician Assistant

## 2021-09-29 NOTE — Telephone Encounter (Signed)
Patient had an appointment to [facility] today. Patients mother called advising [place] patient did not come to appointment due to her having seizures. Patient mother was advise to take patient to ER and call patients Neurologist and let him know.

## 2021-10-05 ENCOUNTER — Encounter: Payer: Self-pay | Admitting: Physician Assistant

## 2021-10-05 ENCOUNTER — Ambulatory Visit: Payer: Self-pay | Admitting: Physician Assistant

## 2021-10-05 VITALS — BP 132/83 | HR 82 | Temp 97.0°F | Ht 69.0 in | Wt 195.0 lb

## 2021-10-05 DIAGNOSIS — L0231 Cutaneous abscess of buttock: Secondary | ICD-10-CM

## 2021-10-05 DIAGNOSIS — F17200 Nicotine dependence, unspecified, uncomplicated: Secondary | ICD-10-CM

## 2021-10-05 DIAGNOSIS — F1910 Other psychoactive substance abuse, uncomplicated: Secondary | ICD-10-CM

## 2021-10-05 DIAGNOSIS — I10 Essential (primary) hypertension: Secondary | ICD-10-CM

## 2021-10-05 DIAGNOSIS — G40909 Epilepsy, unspecified, not intractable, without status epilepticus: Secondary | ICD-10-CM

## 2021-10-05 DIAGNOSIS — Z7689 Persons encountering health services in other specified circumstances: Secondary | ICD-10-CM

## 2021-10-05 MED ORDER — AMLODIPINE BESYLATE 5 MG PO TABS: ORAL_TABLET | ORAL | 0 refills | Status: DC

## 2021-10-05 MED ORDER — SULFAMETHOXAZOLE-TRIMETHOPRIM 800-160 MG PO TABS: 1.0000 | ORAL_TABLET | Freq: Two times a day (BID) | ORAL | 0 refills | Status: DC

## 2021-10-05 MED ORDER — ALBUTEROL SULFATE HFA 108 (90 BASE) MCG/ACT IN AERS: INHALATION_SPRAY | RESPIRATORY_TRACT | 0 refills | Status: DC

## 2021-10-05 NOTE — Patient Instructions (Signed)
Skin Abscess  A skin abscess is an infected area on or under your skin that contains a collection of pus and other material. An abscess may also be called a furuncle,carbuncle, or boil. An abscess can occur in or on almost any part of your body. Some abscesses break open (rupture) on their own. Most continue to get worse unless they are treated. The infection can spread deeper into the body and eventually into your blood, whichcan make you feel ill. Treatment usually involves draining the abscess. What are the causes? An abscess occurs when germs, like bacteria, pass through your skin and cause an infection. This may be caused by: A scrape or cut on your skin. A puncture wound through your skin, including a needle injection or insect bite. Blocked oil or sweat glands. Blocked and infected hair follicles. A cyst that forms beneath your skin (sebaceous cyst) and becomes infected. What increases the risk? This condition is more likely to develop in people who: Have a weak body defense system (immune system). Have diabetes. Have dry and irritated skin. Get frequent injections or use illegal IV drugs. Have a foreign body in a wound, such as a splinter. Have problems with their lymph system or veins. What are the signs or symptoms? Symptoms of this condition include: A painful, firm bump under the skin. A bump with pus at the top. This may break through the skin and drain. Other symptoms include: Redness surrounding the abscess site. Warmth. Swelling of the lymph nodes (glands) near the abscess. Tenderness. A sore on the skin. How is this diagnosed? This condition may be diagnosed based on: A physical exam. Your medical history. A sample of pus. This may be used to find out what is causing the infection. Blood tests. Imaging tests, such as an ultrasound, CT scan, or MRI. How is this treated? A small abscess that drains on its own may not need treatment. Treatment for larger abscesses  may include: Moist heat or heat pack applied to the area several times a day. A procedure to drain the abscess (incision and drainage). Antibiotic medicines. For a severe abscess, you may first get antibiotics through an IV and then change to antibiotics by mouth. Follow these instructions at home: Medicines  Take over-the-counter and prescription medicines only as told by your health care provider. If you were prescribed an antibiotic medicine, take it as told by your health care provider. Do not stop taking the antibiotic even if you start to feel better.  Abscess care  If you have an abscess that has not drained, apply heat to the affected area. Use the heat source that your health care provider recommends, such as a moist heat pack or a heating pad. Place a towel between your skin and the heat source. Leave the heat on for 20-30 minutes. Remove the heat if your skin turns bright red. This is especially important if you are unable to feel pain, heat, or cold. You may have a greater risk of getting burned. Follow instructions from your health care provider about how to take care of your abscess. Make sure you: Cover the abscess with a bandage (dressing). Change your dressing or gauze as told by your health care provider. Wash your hands with soap and water before you change the dressing or gauze. If soap and water are not available, use hand sanitizer. Check your abscess every day for signs of a worsening infection. Check for: More redness, swelling, or pain. More fluid or blood. Warmth. More   pus or a bad smell.  General instructions To avoid spreading the infection: Do not share personal care items, towels, or hot tubs with others. Avoid making skin contact with other people. Keep all follow-up visits as told by your health care provider. This is important. Contact a health care provider if you have: More redness, swelling, or pain around your abscess. More fluid or blood coming  from your abscess. Warm skin around your abscess. More pus or a bad smell coming from your abscess. A fever. Muscle aches. Chills or a general ill feeling. Get help right away if you: Have severe pain. See red streaks on your skin spreading away from the abscess. Summary A skin abscess is an infected area on or under your skin that contains a collection of pus and other material. A small abscess that drains on its own may not need treatment. Treatment for larger abscesses may include having a procedure to drain the abscess and taking an antibiotic. This information is not intended to replace advice given to you by your health care provider. Make sure you discuss any questions you have with your healthcare provider. Document Revised: 02/08/2019 Document Reviewed: 12/01/2017 Elsevier Patient Education  2022 Elsevier Inc.  

## 2021-10-05 NOTE — Progress Notes (Signed)
BP 132/83   Pulse 82   Temp (!) 97 F (36.1 C)   Ht 5\' 9"  (1.753 m)   Wt 195 lb (88.5 kg)   SpO2 98%   BMI 28.80 kg/m    Subjective:    Patient ID: , female    DOB: 1971-09-07, 50 y.o.   MRN: [id number]  HPI: Katrina Good is a 50 y.o. female presenting on 10/05/2021 for New Patient (Initial Visit)   HPI   Pt is 50yoF who presents to re-establish care.  She was last seen here 05/25/21.  She no-showed to 2 appointments last week and was 30 minutes late for her appointment today.  One of her appointments last week was forgiven.  She was very unhappy when trying to encourage her to show up and on time for her appointments.  Pt didn't call her neurologists about seizures last week as recommended because she doesn't want to go into hospital and she says they told her that was what she needed.   She says she hasn't taken any keppra since Thursday.   Rx was sent to [facility] by her neurologist at appointment 09/14/21.  She says she took the levaquin rx that this offce sent to pharmacy for her legionella.  She says she hasn't seen kidney doctor in a long time.   Bun/cr normal in october  She says shie is no longer having incontinence of stool  She is still smoking  She has long history of polysubstance abuse.  She says last time she used drugs was last week-  No MJ or anything.   She says she is aware that her substance abuse makes it difficult if not impossible to control her seizures.  Pt says she has a "Sore thing on her butt cheek" 2 months.  She says "it won't come to a head"  and "it keeps moving around"    Relevant past medical, surgical, family and social history reviewed and updated as indicated. Interim medical history since our last visit reviewed. Allergies and medications reviewed and updated.    Current Outpatient Medications:    albuterol (PROVENTIL HFA) 108 (90 Base) MCG/ACT inhaler, INHALE 2 PUFFS BY MOUTH EVERY 6 HOURS AS NEEDED FOR COUGHING,  WHEEZING, OR SHORTNESS OF BREATH (Patient not taking: Reported on 10/05/2021), Disp: 20.1 g, Rfl: 0   amLODipine (NORVASC) 5 MG tablet, TAKE 1 Tablet BY MOUTH ONCE EVERY DAY (Patient not taking: Reported on 10/05/2021), Disp: 90 tablet, Rfl: 0   aspirin EC 81 MG tablet, Take 243 mg by mouth daily. Take 3 tabs daily (243 mg) (Patient not taking: Reported on 10/05/2021), Disp: , Rfl:    diclofenac (VOLTAREN) 75 MG EC tablet, Take 1 tablet (75 mg total) by mouth 2 (two) times daily as needed. (Patient not taking: Reported on 10/05/2021), Disp: 60 tablet, Rfl: 0   levETIRAcetam (KEPPRA) 1000 MG tablet, 500mg  AM and 1000mg  PM (Patient not taking: Reported on 10/05/2021), Disp: 45 tablet, Rfl: 5   Vitamin D, Ergocalciferol, (DRISDOL) 1.25 MG (50000 UNIT) CAPS capsule, Take 50,000 Units by mouth once a week. (Patient not taking: Reported on 10/05/2021), Disp: , Rfl:       Review of Systems  Per HPI unless specifically indicated above     Objective:    BP 132/83   Pulse 82   Temp (!) 97 F (36.1 C)   Ht 5\' 9"  (1.753 m)   Wt 195 lb (88.5 kg)   SpO2 98%   BMI  28.80 kg/m   Wt Readings from Last 3 Encounters:  10/05/21 195 lb (88.5 kg)  09/14/21 195 lb (88.5 kg)  08/06/21 194 lb 14.2 oz (88.4 kg)    Physical Exam Vitals reviewed. Exam conducted with a chaperone present.  Constitutional:      General: She is not in acute distress.    Appearance: She is well-developed. She is not ill-appearing.  HENT:     Head: Normocephalic and atraumatic.  Eyes:     Extraocular Movements: Extraocular movements intact.     Conjunctiva/sclera: Conjunctivae normal.     Pupils: Pupils are equal, round, and reactive to light.  Neck:     Thyroid: No thyromegaly.  Cardiovascular:     Rate and Rhythm: Normal rate and regular rhythm.  Pulmonary:     Effort: Pulmonary effort is normal.     Breath sounds: Normal breath sounds.  Abdominal:     General: Bowel sounds are normal.     Palpations: Abdomen is soft.  There is no mass.     Tenderness: There is no abdominal tenderness.  Genitourinary:    Comments: Abscess right buttock slightly smaller than 1 inch diameter, fluctuant, tender.  No drainage or communication with skin.  Abscess is not near rectum  Enrique Sack chaperone) Musculoskeletal:     Cervical back: Neck supple.     Right lower leg: No edema.     Left lower leg: No edema.  Lymphadenopathy:     Cervical: No cervical adenopathy.  Skin:    General: Skin is warm and dry.  Neurological:     Mental Status: She is alert and oriented to person, place, and time.     Gait: Gait normal.           Assessment & Plan:    Encounter Diagnoses  Name Primary?   Encounter to establish care Yes   Primary hypertension    Cutaneous abscess of buttock    Seizure disorder (HCC)    Tobacco use disorder    Substance abuse (HCC)      HCM: Mammogram 12/2020 FIT test 03/2021  Abscess: Encouraged I&D to help resolve the abscess.  Pt declined I&d. Rx septra Warm soaks  Htn: Refilled amlodipine  Seizure d/o: Per neurology  Pt to follow up 3 months.  She is to contact office sooner prn

## 2021-10-09 ENCOUNTER — Encounter (HOSPITAL_COMMUNITY): Payer: Self-pay | Admitting: *Deleted

## 2021-10-09 ENCOUNTER — Emergency Department (HOSPITAL_COMMUNITY)
Admission: EM | Admit: 2021-10-09 | Discharge: 2021-10-09 | Disposition: A | Discharge status: Home / Self Care | Payer: Self-pay | Attending: Emergency Medicine | Admitting: Emergency Medicine

## 2021-10-09 DIAGNOSIS — L0231 Cutaneous abscess of buttock: Secondary | ICD-10-CM | POA: Insufficient documentation

## 2021-10-09 DIAGNOSIS — Z79899 Other long term (current) drug therapy: Secondary | ICD-10-CM | POA: Insufficient documentation

## 2021-10-09 DIAGNOSIS — J45909 Unspecified asthma, uncomplicated: Secondary | ICD-10-CM | POA: Insufficient documentation

## 2021-10-09 DIAGNOSIS — I10 Essential (primary) hypertension: Secondary | ICD-10-CM | POA: Insufficient documentation

## 2021-10-09 DIAGNOSIS — F17210 Nicotine dependence, cigarettes, uncomplicated: Secondary | ICD-10-CM | POA: Insufficient documentation

## 2021-10-09 DIAGNOSIS — E119 Type 2 diabetes mellitus without complications: Secondary | ICD-10-CM | POA: Insufficient documentation

## 2021-10-09 MED ORDER — LIDOCAINE-EPINEPHRINE (PF) 2 %-1:200000 IJ SOLN
20.0000 mL | Freq: Once | INTRAMUSCULAR | Status: AC
  Administered 2021-10-09: 20 mL
  Filled 2021-10-09: qty 20

## 2021-10-09 NOTE — ED Triage Notes (Signed)
Abscess right buttock x 2 months

## 2021-10-09 NOTE — ED Provider Notes (Signed)
[facility] EMERGENCY DEPARTMENT Provider Note   CSN: [id number] Arrival date & time: 10/09/21  1312     History Chief Complaint  Patient presents with   Abscess    Katrina Good is a 50 y.o. female.  The history is provided by the patient and medical records. No language interpreter was used.  Abscess Associated symptoms: no fever    50 year old female significant history of diabetes, hypertension, substance abuse, who presents with complaints of skin infection.  Patient noticed a area of soreness that appears on her right buttock ongoing for more than a month.  She was seen by her PCP 4 days ago for her complaint.  She was told to come to the ER for further managements of her abscess and was prescribed Bactrim.  She was unable to obtain a ride until today.  She is complaining of sharp achy pain to the affected area worse when she sits on it.  Pain is moderate in severity nonradiating without any rectal pain no fever no nausea vomiting.  She is up-to-date with tetanus.  She has been taking her antibiotic as prescribed.  Past Medical History:  Diagnosis Date   Acid reflux    Asthma    Bronchitis    Chronic heel pain, left    Diabetes mellitus    Hypertension    Seizures (HCC)    onset was 08/2020   Substance abuse (HCC)    pt denies   Suicidal thoughts    pt denies    Patient Active Problem List   Diagnosis Date Noted   Sepsis (HCC) 07/19/2021   Puncture wound of left foot with complication 05/03/2018   Calcaneus fracture, left 05/03/2018   Cocaine abuse (HCC) 05/03/2018   Cellulitis of left foot 05/02/2018   Hypokalemia    Colles' fracture of right radius 03/13/2012    Past Surgical History:  Procedure Laterality Date   ABDOMINAL HYSTERECTOMY     partial   CESAREAN SECTION     x 2     OB History     Gravida  5   Para  2   Term  2   Preterm      AB  3   Living  2      SAB  2   IAB  1   Ectopic      Multiple      Live Births               Family History  Problem Relation Age of Onset   Diabetes Mother    Hypertension Mother    Heart failure Mother    Seizures Father    Cancer Father    Hypertension Father    Heart failure Father    Asthma Father     Social History   Tobacco Use   Smoking status: Every Day    Packs/day: 2.00    Years: 33.00    Pack years: 66.00    Types: Cigarettes   Smokeless tobacco: Never   Tobacco comments:    2-3 packs  Vaping Use   Vaping Use: Never used  Substance Use Topics   Alcohol use: Not Currently   Drug use: Yes    Types: Marijuana, "Crack" cocaine, Cocaine    Comment: 9/15/20222 used yesterday- crack cocaine; uses crack (but not cocaine).  not MJ user    Home Medications Prior to Admission medications   Medication Sig Start Date End Date Taking? Authorizing Provider  albuterol (PROVENTIL  HFA) 108 (90 Base) MCG/ACT inhaler INHALE 2 PUFFS BY MOUTH EVERY 6 HOURS AS NEEDED FOR COUGHING, WHEEZING, OR SHORTNESS OF BREATH 10/05/21   Jacquelin Hawking, PA-C  amLODipine (NORVASC) 5 MG tablet TAKE 1 Tablet BY MOUTH ONCE EVERY DAY 10/05/21   Jacquelin Hawking, PA-C  aspirin EC 81 MG tablet Take 243 mg by mouth daily. Take 3 tabs daily (243 mg) Patient not taking: Reported on 10/05/2021    [provider]  diclofenac (VOLTAREN) 75 MG EC tablet Take 1 tablet (75 mg total) by mouth 2 (two) times daily as needed. Patient not taking: Reported on 10/05/2021 03/24/21   Jacquelin Hawking, PA-C  levETIRAcetam (KEPPRA) 1000 MG tablet 500mg  AM and 1000mg  PM Patient not taking: Reported on 10/05/2021 09/14/21   14/03/2021, NP  sulfamethoxazole-trimethoprim (BACTRIM DS) 800-160 MG tablet Take 1 tablet by mouth 2 (two) times daily. 10/05/21   Ihor Austin, PA-C  Vitamin D, Ergocalciferol, (DRISDOL) 1.25 MG (50000 UNIT) CAPS capsule Take 50,000 Units by mouth once a week. Patient not taking: Reported on 10/05/2021 03/25/21   [provider]    Allergies    Doxycycline and  Strawberry extract  Review of Systems   Review of Systems  Constitutional:  Negative for fever.  Skin:  Negative for wound.  Neurological:  Negative for numbness.   Physical Exam Updated Vital Signs BP (!) 145/81 (BP Location: Right Arm)   Pulse 74   Temp 97.9 F (36.6 C) (Oral)   Resp 15   SpO2 99%   Physical Exam Vitals and nursing note reviewed.  Constitutional:      General: She is not in acute distress.    Appearance: She is well-developed.  HENT:     Head: Atraumatic.  Eyes:     Conjunctiva/sclera: Conjunctivae normal.  Pulmonary:     Effort: Pulmonary effort is normal.  Abdominal:     Palpations: Abdomen is soft.     Tenderness: There is no abdominal tenderness.  Genitourinary:    Comments: Chaperone present during exam.  There is an area of induration fluctuant noted to right gluteus region tender to palpation without surrounding erythema. Musculoskeletal:     Cervical back: Neck supple.  Skin:    Findings: No rash.  Neurological:     Mental Status: She is alert.  Psychiatric:        Mood and Affect: Mood normal.    ED Results / Procedures / Treatments   Labs (all labs ordered are listed, but only abnormal results are displayed) Labs Reviewed - No data to display  EKG None  Radiology No results found.  Procedures .14/5/2022Incision and Drainage  Date/Time: 10/09/2021 5:51 PM Performed by: Marland Kitchen, PA-C Authorized by: 14/07/2021, PA-C   Consent:    Consent obtained:  Verbal   Consent given by:  Patient   Risks discussed:  Bleeding, incomplete drainage, pain and damage to other organs   Alternatives discussed:  No treatment Universal protocol:    Procedure explained and questions answered to patient or proxy's satisfaction: yes     Relevant documents present and verified: yes     Test results available : yes     Imaging studies available: yes     Required blood products, implants, devices, and special equipment available: yes     Site/side  marked: yes     Immediately prior to procedure, a time out was called: yes     Patient identity confirmed:  Verbally with patient Location:  Type:  Abscess   Size:  3cm   Location:  Anogenital   Anogenital location: right buttock. Pre-procedure details:    Skin preparation:  Betadine Anesthesia:    Anesthesia method:  Local infiltration   Local anesthetic:  Lidocaine 1% WITH epi Procedure type:    Complexity:  Simple Procedure details:    Incision types:  Single straight   Incision depth:  Subcutaneous   Wound management:  Probed and deloculated, irrigated with saline and extensive cleaning   Drainage:  Purulent   Drainage amount:  Scant   Packing materials:  1/2 in iodoform gauze Post-procedure details:    Procedure completion:  Tolerated well, no immediate complications   Medications Ordered in ED Medications  lidocaine-EPINEPHrine (XYLOCAINE W/EPI) 2 %-1:200000 (PF) injection 20 mL (has no administration in time range)    ED Course  I have reviewed the triage vital signs and the nursing notes.  Pertinent labs & imaging results that were available during my care of the patient were reviewed by me and considered in my medical decision making (see chart for details).    MDM Rules/Calculators/A&P                           BP 138/84 (BP Location: Right Arm)   Pulse 76   Temp 97.9 F (36.6 C) (Oral)   Resp 16   SpO2 99%   Final Clinical Impression(s) / ED Diagnoses Final diagnoses:  Abscess of right buttock    Rx / DC Orders ED Discharge Orders     None      5:24 PM Patient here with an abscess involving her right buttock that were requiring sensation and drainage.  She is currently on antibiotic.  She is up-to-date with tetanus.  Will perform I&D here.  5:52 PM Successful incision and drainage of right buttock abscess.  Appropriate wound care provided, encouraged patient to continue with antibiotic as previously prescribed.  Return precaution given.   Packing to be removed in 2 days.   Fayrene Helper, PA-C 10/09/21 1755    Bethann Berkshire, MD 10/10/21 [phone]

## 2021-10-09 NOTE — Discharge Instructions (Addendum)
Please apply warm moist compress to affected area several times daily to help aid with healing.  Packing can be removed in 2 days.  Continue taking antibiotic as prescribed.  Return if you have any concern.

## 2021-10-12 ENCOUNTER — Telehealth: Payer: Self-pay

## 2021-10-12 NOTE — Telephone Encounter (Signed)
Call from pt regarding I&D done in hospital & says she has no knowledge of how to clean and care for wound, informed pt of note regarding warm compress with antibiotic and to take baths where she can soak incision. Informed packing should have been taken out after day 2 & advised to take that out today.

## 2021-10-14 ENCOUNTER — Telehealth: Payer: Self-pay

## 2021-10-14 NOTE — Telephone Encounter (Signed)
Returned call to client, she has her Keppra at [place] that she is unable to afford. Confirmed cost at [facility] in [place] of 23.66$ for one month.  [facility] will provide a one time assistance with gift card to assist client. Client will come by [facility] office today after her Disability evaluation in town.  Francee Nodal RN [employer]

## 2021-11-23 NOTE — Congregational Nurse Program (Signed)
°  Dept: [phone]   Congregational Nurse Program Note  Date of Encounter: 11/23/2021  Past Medical History: Past Medical History:  Diagnosis Date   Acid reflux    Asthma    Bronchitis    Chronic heel pain, left    Diabetes mellitus    Hypertension    Seizures (HCC)    onset was 08/2020   Substance abuse (HCC)    pt denies   Suicidal thoughts    pt denies    Encounter Details:  CNP Questionnaire - 11/23/21 0956       Questionnaire   Do you give verbal consent to treat you today? Yes    Location Patient Information systems manager, [employer] or Organization    Insurance Uninsured ([place] [facility]/Self-Pay)    Insurance Referral [place] Card/[facility]    Medication Provided Medication Assistance    Medical Provider Yes    Screening Referrals N/A    Medical Referral Non-[facility] PCP/Clinic    Medical Appointment Made N/A;Vision    Transportation Need transportation assistance    Housing/Utilities No permanent housing    Economist N/A    Intervention N/A;Navigate Healthcare System;Case Management;Support    ED Visit Averted N/A    Life-Saving Intervention Made N/A            [facility]/CN client encountered at [facility] in [place]. She did get food assistance today.  Client wanted to know the dates of her upcoming [facility] appointment and when she needed to contact [facility] for Transportation.  Client given written note of upcoming appointments in march and explained to call toward the end of February for transportation.  Client was here today with her boyfriend, information provided to enroll in [facility] and appt made for enrollment.  Client asked about dental, discussed that [facility] provider would be able to send [place] referral for dental, will check dental list. Also client asking for a vision referral. Will send [employer] referral to [employer] SW who screens [place] [employer] referrals.  Francee Nodal RN [facility] CN program.

## 2021-12-22 ENCOUNTER — Other Ambulatory Visit: Payer: Self-pay | Admitting: Physician Assistant

## 2022-01-11 ENCOUNTER — Ambulatory Visit: Payer: Self-pay | Admitting: Physician Assistant
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
## 2022-01-13 NOTE — Patient Instructions (Incomplete)

## 2022-01-14 ENCOUNTER — Encounter: Payer: Self-pay | Admitting: Family Medicine

## 2022-01-14 ENCOUNTER — Ambulatory Visit: Payer: Self-pay | Admitting: Family Medicine

## 2022-01-14 DIAGNOSIS — G40909 Epilepsy, unspecified, not intractable, without status epilepticus: Secondary | ICD-10-CM

## 2022-01-18 ENCOUNTER — Ambulatory Visit: Payer: Self-pay | Admitting: Family Medicine
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
## 2022-01-18 NOTE — Patient Instructions (Incomplete)

## 2022-01-19 ENCOUNTER — Ambulatory Visit: Payer: MEDICAID | Admitting: Family Medicine

## 2022-01-19 ENCOUNTER — Telehealth: Payer: Self-pay | Admitting: Family Medicine

## 2022-01-19 DIAGNOSIS — G40909 Epilepsy, unspecified, not intractable, without status epilepticus: Secondary | ICD-10-CM

## 2022-01-19 NOTE — Telephone Encounter (Signed)
FYI: Pt had 3rd no show/cancellation within 24 hours today.  ?

## 2022-02-23 ENCOUNTER — Ambulatory Visit: Payer: Self-pay | Admitting: Physician Assistant

## 2022-02-23 ENCOUNTER — Other Ambulatory Visit (HOSPITAL_COMMUNITY)
Admission: RE | Admit: 2022-02-23 | Discharge: 2022-02-23 | Disposition: A | Discharge status: Home / Self Care | Payer: Self-pay | Source: Ambulatory Visit | Attending: Physician Assistant | Admitting: Physician Assistant

## 2022-02-23 ENCOUNTER — Encounter: Payer: Self-pay | Admitting: Physician Assistant

## 2022-02-23 VITALS — BP 126/72 | HR 85 | Temp 97.1°F | Wt 202.3 lb

## 2022-02-23 DIAGNOSIS — G40909 Epilepsy, unspecified, not intractable, without status epilepticus: Secondary | ICD-10-CM

## 2022-02-23 DIAGNOSIS — I10 Essential (primary) hypertension: Secondary | ICD-10-CM | POA: Insufficient documentation

## 2022-02-23 DIAGNOSIS — R7303 Prediabetes: Secondary | ICD-10-CM | POA: Insufficient documentation

## 2022-02-23 DIAGNOSIS — R5383 Other fatigue: Secondary | ICD-10-CM | POA: Insufficient documentation

## 2022-02-23 DIAGNOSIS — F17200 Nicotine dependence, unspecified, uncomplicated: Secondary | ICD-10-CM

## 2022-02-23 DIAGNOSIS — F1910 Other psychoactive substance abuse, uncomplicated: Secondary | ICD-10-CM

## 2022-02-23 DIAGNOSIS — R39198 Other difficulties with micturition: Secondary | ICD-10-CM

## 2022-02-23 DIAGNOSIS — Z1239 Encounter for other screening for malignant neoplasm of breast: Secondary | ICD-10-CM

## 2022-02-23 LAB — COMPREHENSIVE METABOLIC PANEL
ALT: 13 U/L (ref 0–44)
AST: 13 U/L — ABNORMAL LOW (ref 15–41)
Albumin: 3.9 g/dL (ref 3.5–5.0)
Alkaline Phosphatase: 128 U/L — ABNORMAL HIGH (ref 38–126)
Anion gap: 7 (ref 5–15)
BUN: 18 mg/dL (ref 6–20)
CO2: 22 mmol/L (ref 22–32)
Calcium: 8.7 mg/dL — ABNORMAL LOW (ref 8.9–10.3)
Chloride: 109 mmol/L (ref 98–111)
Creatinine, Ser: 0.79 mg/dL (ref 0.44–1.00)
GFR, Estimated: >60 mL/min (ref >60)
Glucose, Bld: 131 mg/dL — ABNORMAL HIGH (ref 70–99)
Potassium: 3.5 mmol/L (ref 3.5–5.1)
Sodium: 138 mmol/L (ref 135–145)
Total Bilirubin: 0.3 mg/dL (ref 0.3–1.2)
Total Protein: 7.8 g/dL (ref 6.5–8.1)

## 2022-02-23 LAB — CBC WITH DIFFERENTIAL/PLATELET
Abs Immature Granulocytes: 0.06 10*3/uL (ref 0.00–0.07)
Basophils Absolute: 0.1 10*3/uL (ref 0.0–0.1)
Basophils Relative: 1 %
Eosinophils Absolute: 0.2 10*3/uL (ref 0.0–0.5)
Eosinophils Relative: 2 %
HCT: 44.4 % (ref 36.0–46.0)
Hemoglobin: 14.5 g/dL (ref 12.0–15.0)
Immature Granulocytes: 1 %
Lymphocytes Relative: 32 %
Lymphs Abs: 2.6 10*3/uL (ref 0.7–4.0)
MCH: 27.1 pg (ref 26.0–34.0)
MCHC: 32.7 g/dL (ref 30.0–36.0)
MCV: 83 fL (ref 80.0–100.0)
Monocytes Absolute: 0.7 10*3/uL (ref 0.1–1.0)
Monocytes Relative: 8 %
Neutro Abs: 4.6 10*3/uL (ref 1.7–7.7)
Neutrophils Relative %: 56 %
Platelets: 168 10*3/uL (ref 150–400)
RBC: 5.35 MIL/uL — ABNORMAL HIGH (ref 3.87–5.11)
RDW: 15.6 % — ABNORMAL HIGH (ref 11.5–15.5)
WBC: 8.1 10*3/uL (ref 4.0–10.5)
nRBC: 0 % (ref 0.0–0.2)

## 2022-02-23 LAB — HEMOGLOBIN A1C
Hgb A1c MFr Bld: 6.3 % — ABNORMAL HIGH (ref 4.8–5.6)
Mean Plasma Glucose: 134.11 mg/dL

## 2022-02-23 NOTE — Progress Notes (Signed)
? ?BP 126/72   Pulse 85   Temp (!) 97.1 ?F (36.2 ?C)   Wt 202 lb 4.8 oz (91.8 kg)   SpO2 98%   BMI 29.87 kg/m?   ? ?Subjective:  ? ? Patient ID: Katrina Good, female    DOB: [birth year], 51 y.o.   MRN: [id number] ? ?HPI: ?Katrina Good is a 51 y.o. female presenting on 02/23/2022 for Follow-up (Pt complains of leg pain, back pain, HA and fatigue. Pt has an appointment with neurologist in July, but is concerned about her seizures. Pt reports last seizure was this morning and has been become. ) ? ? ?HPI ? ? ? ?Chief Complaint  ?Patient presents with  ? Follow-up  ?  Pt complains of leg pain, back pain, HA and fatigue. Pt has an appointment with neurologist in July, but is concerned about her seizures. Pt reports last seizure was this morning and has been become.   ? ? ? ? ? ?Pt was last seen here 10/05/21.  She was no-show to her march appointment.  She has not been seen by neurology since- November 2022.   She has had many no-shows with neurology.  She has appointment to see neurology in July. ? ?Pt says she is still using cocaine.  Her last use was yesterday. ? ?She says she hasn't voided since yesterday.   She saw nephrologist once and didn't return  for her follow up.  That was 03/05/21.   ?  ?She says her belly doesn't hurt just her belly is swelled. ? ?She says her Last BM was normal.  She says that yes she is moving her bowels normally but can't void. ? ?She started taking 2 amlodipine pills on her own because her bp was high. ? ?She used cocaine so that may have contributed to her seizure.  She says she is aware that her drug use affects her seizures.  ? ?She says she is taking motrin or aleve for pain which helps for a little while.   ? ? ? ?Relevant past medical, surgical, family and social history reviewed and updated as indicated. Interim medical history since our last visit reviewed. ?Allergies and medications reviewed and updated. ? ? ? ?Current Outpatient Medications:  ?  albuterol (VENTOLIN HFA)  108 (90 Base) MCG/ACT inhaler, INHALE 2 PUFFS BY MOUTH EVERY 6 HOURS AS NEEDED FOR COUGHING, WHEEZING, OR SHORTNESS OF BREATH, Disp: 20.1 g, Rfl: 0 ?  amLODipine (NORVASC) 5 MG tablet, TAKE 1 TABLET BY MOUTH EVERY DAY, Disp: 90 tablet, Rfl: 0 ?  aspirin 81 MG chewable tablet, Chew by mouth daily., Disp: , Rfl:  ?  levETIRAcetam (KEPPRA) 1000 MG tablet, 500mg  AM and 1000mg  PM, Disp: 45 tablet, Rfl: 5 ?  naproxen sodium (ALEVE) 220 MG tablet, Take 220 mg by mouth., Disp: , Rfl:  ? ? ? ? ?Review of Systems ? ?Per HPI unless specifically indicated above ? ?   ?Objective:  ?  ?BP 126/72   Pulse 85   Temp (!) 97.1 ?F (36.2 ?C)   Wt 202 lb 4.8 oz (91.8 kg)   SpO2 98%   BMI 29.87 kg/m?   ?Wt Readings from Last 3 Encounters:  ?02/23/22 202 lb 4.8 oz (91.8 kg)  ?10/05/21 195 lb (88.5 kg)  ?09/14/21 195 lb (88.5 kg)  ?  ?Physical Exam ?Vitals reviewed.  ?Constitutional:   ?   General: She is not in acute distress. ?   Appearance: She is well-developed. She is not toxic-appearing.  ?  HENT:  ?   Head: Normocephalic and atraumatic.  ?Cardiovascular:  ?   Rate and Rhythm: Normal rate and regular rhythm.  ?Pulmonary:  ?   Effort: Pulmonary effort is normal.  ?   Breath sounds: Normal breath sounds.  ?Abdominal:  ?   General: Bowel sounds are normal. There is no distension.  ?   Palpations: Abdomen is soft. There is no fluid wave, hepatomegaly, splenomegaly, mass or pulsatile mass.  ?   Tenderness: There is no abdominal tenderness.  ?   Comments: No distention.  No tenderness over bladder.  Normal abdominal exam.  ?Musculoskeletal:  ?   Cervical back: Neck supple.  ?   Right lower leg: No edema.  ?   Left lower leg: No edema.  ?Lymphadenopathy:  ?   Cervical: No cervical adenopathy.  ?Skin: ?   General: Skin is warm and dry.  ?Neurological:  ?   Mental Status: She is alert and oriented to person, place, and time.  ?Psychiatric:     ?   Behavior: Behavior normal.  ? ? ? ? ? ? ?   ?Assessment & Plan:  ? ?Encounter Diagnoses  ?Name  Primary?  ? Primary hypertension Yes  ? Seizure disorder (HCC)   ? Tobacco use disorder   ? Substance abuse (HCC)   ? Prediabetes   ? Other fatigue   ? Encounter for screening for malignant neoplasm of breast, unspecified screening modality   ? Difficulty urinating   ? ? ? ? ?-Refer for Screening mammogram ?-Pt to notify neurology that she had seizure ?-pt was encouraged to avoid drugs/cocaine ?-pt to Get labs drawn today when she leaves office ?-pt encouraged to drink plenty of water.  She is to contact office tomorrow morning if she has still not voided.  She is to go to ER if she starts feeling abdominal pain and isn't voiding (discussed she may need catheter) ?-pt to follow up 3-4 weeks.  She is to contact office sooner prn ?

## 2022-02-23 NOTE — Patient Instructions (Signed)
Call office tomorrow to let us know if you voided ?

## 2022-03-02 ENCOUNTER — Ambulatory Visit: Payer: Self-pay | Admitting: Licensed Clinical Social Worker

## 2022-03-16 ENCOUNTER — Encounter: Payer: Self-pay | Admitting: Physician Assistant

## 2022-03-16 ENCOUNTER — Ambulatory Visit: Payer: Self-pay | Admitting: Physician Assistant

## 2022-03-16 VITALS — BP 136/88 | HR 83 | Temp 97.3°F | Wt 202.0 lb

## 2022-03-16 DIAGNOSIS — Z636 Dependent relative needing care at home: Secondary | ICD-10-CM

## 2022-03-16 DIAGNOSIS — R7303 Prediabetes: Secondary | ICD-10-CM

## 2022-03-16 DIAGNOSIS — I10 Essential (primary) hypertension: Secondary | ICD-10-CM

## 2022-03-16 DIAGNOSIS — G40909 Epilepsy, unspecified, not intractable, without status epilepticus: Secondary | ICD-10-CM

## 2022-03-16 DIAGNOSIS — F17200 Nicotine dependence, unspecified, uncomplicated: Secondary | ICD-10-CM

## 2022-03-16 DIAGNOSIS — F1910 Other psychoactive substance abuse, uncomplicated: Secondary | ICD-10-CM

## 2022-03-16 MED ORDER — AMLODIPINE BESYLATE 5 MG PO TABS: ORAL_TABLET | ORAL | 0 refills | Status: DC

## 2022-03-16 MED ORDER — ALBUTEROL SULFATE HFA 108 (90 BASE) MCG/ACT IN AERS: INHALATION_SPRAY | RESPIRATORY_TRACT | 0 refills | Status: DC

## 2022-03-16 NOTE — Progress Notes (Signed)
? ?BP 136/88   Pulse 83   Temp (!) 97.3 ?F (36.3 ?C)   Wt 202 lb (91.6 kg)   SpO2 98%   BMI 29.83 kg/m?   ? ?Subjective:  ? ? Patient ID: Katrina Good, female    DOB: Sep 28, 1971, 51 y.o.   MRN: [id number] ? ?HPI: ?Katrina Good is a 51 y.o. female presenting on 03/16/2022 for Follow-up ? ? ?HPI ? ? ?Pt is 51yoF who is in today for follow up.  She is voiding now (she was having difficulty with this at previous appointment) . ? ?She had seizure this morning. ? ?She is still using the cocaine but says not as much ? 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   ? ?No new complaints today ? ? ? ?Relevant past medical, surgical, family and social history reviewed and updated as indicated. Interim medical history since our last visit reviewed. ?Allergies and medications reviewed and updated. ? ? ? ?Current Outpatient Medications:  ?  amLODipine (NORVASC) 5 MG tablet, TAKE 1 TABLET BY MOUTH EVERY DAY, Disp: 90 tablet, Rfl: 0 ?  aspirin 81 MG chewable tablet, Chew by mouth daily., Disp: , Rfl:  ?  levETIRAcetam (KEPPRA) 1000 MG tablet, 500mg  AM and 1000mg  PM, Disp: 45 tablet, Rfl: 5 ?  naproxen sodium (ALEVE) 220 MG tablet, Take 220 mg by mouth., Disp: , Rfl:  ?  albuterol (VENTOLIN HFA) 108 (90 Base) MCG/ACT inhaler, INHALE 2 PUFFS BY MOUTH EVERY 6 HOURS AS NEEDED FOR COUGHING, WHEEZING, OR SHORTNESS OF BREATH (Patient not taking: Reported on 03/16/2022), Disp: 20.1 g, Rfl: 0 ? ? ?Review of Systems ? ?Per HPI unless specifically indicated above ? ?   ?Objective:  ?  ?BP 136/88   Pulse 83   Temp (!) 97.3 ?F (36.3 ?C)   Wt 202 lb (91.6 kg)   SpO2 98%   BMI 29.83 kg/m?   ?Wt Readings from Last 3 Encounters:  ?03/16/22 202 lb (91.6 kg)  ?02/23/22 202 lb 4.8 oz (91.8 kg)  ?10/05/21 195 lb (88.5 kg)  ?  ?Physical Exam ?Vitals reviewed.  ?Constitutional:   ?   General: She is not in acute distress. ?   Appearance: She is well-developed. She is not toxic-appearing.   ?HENT:  ?   Head: Normocephalic and atraumatic.  ?Cardiovascular:  ?   Rate and Rhythm: Normal rate and regular rhythm.  ?Pulmonary:  ?   Effort: Pulmonary effort is normal.  ?   Breath sounds: Normal breath sounds.  ?Abdominal:  ?   General: Bowel sounds are normal.  ?   Palpations: Abdomen is soft. There is no mass.  ?   Tenderness: There is no abdominal tenderness.  ?Musculoskeletal:  ?   Cervical back: Neck supple.  ?   Right lower leg: No edema.  ?   Left lower leg: No edema.  ?Lymphadenopathy:  ?   Cervical: No cervical adenopathy.  ?Skin: ?   General: Skin is warm and dry.  ?Neurological:  ?   Mental Status: She is alert and oriented to person, place, and time.  ?Psychiatric:     ?   Behavior: Behavior normal.  ? ? ?Results for orders placed or performed during the hospital encounter of 02/23/22  ?Hemoglobin A1c  ?Result Value Ref Range  ? Hgb A1c MFr Bld 6.3 (H) 4.8 - 5.6 %  ? Mean Plasma Glucose 134.11 mg/dL  ?CBC w/Diff/Platelet  ?Result  Value Ref Range  ? WBC 8.1 4.0 - 10.5 K/uL  ? RBC 5.35 (H) 3.87 - 5.11 MIL/uL  ? Hemoglobin 14.5 12.0 - 15.0 g/dL  ? HCT 44.4 36.0 - 46.0 %  ? MCV 83.0 80.0 - 100.0 fL  ? MCH 27.1 26.0 - 34.0 pg  ? MCHC 32.7 30.0 - 36.0 g/dL  ? RDW 15.6 (H) 11.5 - 15.5 %  ? Platelets 168 150 - 400 K/uL  ? nRBC 0.0 0.0 - 0.2 %  ? Neutrophils Relative % 56 %  ? Neutro Abs 4.6 1.7 - 7.7 K/uL  ? Lymphocytes Relative 32 %  ? Lymphs Abs 2.6 0.7 - 4.0 K/uL  ? Monocytes Relative 8 %  ? Monocytes Absolute 0.7 0.1 - 1.0 K/uL  ? Eosinophils Relative 2 %  ? Eosinophils Absolute 0.2 0.0 - 0.5 K/uL  ? Basophils Relative 1 %  ? Basophils Absolute 0.1 0.0 - 0.1 K/uL  ? WBC Morphology MORPHOLOGY UNREMARKABLE   ? RBC Morphology MORPHOLOGY UNREMARKABLE   ? Smear Review MORPHOLOGY UNREMARKABLE   ? Immature Granulocytes 1 %  ? Abs Immature Granulocytes 0.06 0.00 - 0.07 K/uL  ?Comprehensive metabolic panel  ?Result Value Ref Range  ? Sodium 138 135 - 145 mmol/L  ? Potassium 3.5 3.5 - 5.1 mmol/L  ? Chloride 109  98 - 111 mmol/L  ? CO2 22 22 - 32 mmol/L  ? Glucose, Bld 131 (H) 70 - 99 mg/dL  ? BUN 18 6 - 20 mg/dL  ? Creatinine, Ser 0.79 0.44 - 1.00 mg/dL  ? Calcium 8.7 (L) 8.9 - 10.3 mg/dL  ? Total Protein 7.8 6.5 - 8.1 g/dL  ? Albumin 3.9 3.5 - 5.0 g/dL  ? AST 13 (L) 15 - 41 U/L  ? ALT 13 0 - 44 U/L  ? Alkaline Phosphatase 128 (H) 38 - 126 U/L  ? Total Bilirubin 0.3 0.3 - 1.2 mg/dL  ? GFR, Estimated >60 >60 mL/min  ? Anion gap 7 5 - 15  ? ?   ?Assessment & Plan:  ? ?Encounter Diagnoses  ?Name Primary?  ? Primary hypertension Yes  ? Seizure disorder (HCC)   ? Tobacco use disorder   ? Substance abuse (HCC)   ? Caregiver stress   ? Prediabetes   ? ? ? ? ? ?-reviewed labs with pt ?-pt to continue current medications ?-encouraged pt to avoid cocaine ?-she is re-scheduled to see [facility]/counselor ?-pt to see neurologist as scheduled for seizures ?-pt was counseled on diet and lifestyle changes to help slow progression to diabetes ?-pt encouraged to contact her mother's medical provider to see if there were resources to help with her mother's care ?-pt to follow up here 2 months.  She is to contact office sooner prn ? ? ?

## 2022-03-17 ENCOUNTER — Ambulatory Visit: Payer: Self-pay | Admitting: Licensed Clinical Social Worker

## 2022-03-17 DIAGNOSIS — F419 Anxiety disorder, unspecified: Secondary | ICD-10-CM

## 2022-03-17 NOTE — Progress Notes (Signed)
[facility] engaged patient in initial appointment. [facility] provided active listening and validation as patient shared about anxiety symptoms, stressors, substance use, and past trauma. [facility] assessed patient's current support system and coping skills. No SI/HI. ?

## 2022-03-25 DIAGNOSIS — Z0271 Encounter for disability determination: Secondary | ICD-10-CM

## 2022-03-30 ENCOUNTER — Ambulatory Visit: Payer: Self-pay | Admitting: Licensed Clinical Social Worker

## 2022-03-30 DIAGNOSIS — F419 Anxiety disorder, unspecified: Secondary | ICD-10-CM

## 2022-03-30 NOTE — Progress Notes (Signed)
[facility] engaged patient in follow-up session. [facility] provided active listening and validation as patient shared feelings related to past trauma and history of substance use. [facility] led patient in "happy place" meditation.

## 2022-04-14 ENCOUNTER — Ambulatory Visit: Payer: Self-pay | Admitting: Licensed Clinical Social Worker

## 2022-04-14 DIAGNOSIS — F419 Anxiety disorder, unspecified: Secondary | ICD-10-CM

## 2022-04-14 NOTE — Progress Notes (Signed)
[facility] engaged patient in termination session. [facility] led patient in 4-7-8 breathing exercise. [facility] informed patient that [facility] is leaving position at end of month, processed patient's thoughts and feelings related. Patient interested in continued [facility] support.

## 2022-04-15 ENCOUNTER — Ambulatory Visit: Payer: Self-pay | Admitting: Physician Assistant

## 2022-04-22 ENCOUNTER — Other Ambulatory Visit (HOSPITAL_COMMUNITY): Payer: Self-pay | Admitting: Family Medicine

## 2022-04-26 ENCOUNTER — Other Ambulatory Visit (HOSPITAL_COMMUNITY): Payer: Self-pay | Admitting: Medical

## 2022-04-26 ENCOUNTER — Other Ambulatory Visit (HOSPITAL_COMMUNITY): Payer: Self-pay | Admitting: Obstetrics and Gynecology

## 2022-04-26 DIAGNOSIS — N6310 Unspecified lump in the right breast, unspecified quadrant: Secondary | ICD-10-CM

## 2022-05-07 ENCOUNTER — Ambulatory Visit (HOSPITAL_COMMUNITY): Payer: Self-pay

## 2022-05-11 ENCOUNTER — Ambulatory Visit (HOSPITAL_COMMUNITY): Payer: Self-pay

## 2022-05-11 ENCOUNTER — Encounter (HOSPITAL_COMMUNITY): Payer: Self-pay

## 2022-05-26 ENCOUNTER — Ambulatory Visit: Payer: MEDICAID | Admitting: Adult Health
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
## 2022-05-26 NOTE — Telephone Encounter (Signed)
FYI: Pt no showed appt today, this is the 4th no show/late cancellation for her this year.

## 2022-05-26 NOTE — Telephone Encounter (Signed)
She had 3 no show/late cancellation visits 4 months ago.  She was allowed 1 additional appointment at that time and was advised at that time if appointment was not kept, she would be dismissed from office.  Unfortunately, she no showed for her appointment today which would make this her 4th no show/late cancellation for this calendar year. Request patient be dismissed from office at this time.

## 2022-05-31 ENCOUNTER — Encounter: Payer: Self-pay | Admitting: Adult Health

## 2022-06-10 ENCOUNTER — Other Ambulatory Visit: Payer: Self-pay | Admitting: Physician Assistant

## 2022-06-21 ENCOUNTER — Ambulatory Visit: Payer: Self-pay | Admitting: Physician Assistant

## 2022-06-21 ENCOUNTER — Encounter: Payer: Self-pay | Admitting: Physician Assistant

## 2022-06-21 VITALS — BP 133/82 | HR 85 | Temp 97.5°F | Wt 204.0 lb

## 2022-06-21 DIAGNOSIS — B351 Tinea unguium: Secondary | ICD-10-CM

## 2022-06-21 DIAGNOSIS — F419 Anxiety disorder, unspecified: Secondary | ICD-10-CM | POA: Insufficient documentation

## 2022-06-21 DIAGNOSIS — M25551 Pain in right hip: Secondary | ICD-10-CM

## 2022-06-21 DIAGNOSIS — F17200 Nicotine dependence, unspecified, uncomplicated: Secondary | ICD-10-CM

## 2022-06-21 DIAGNOSIS — G40909 Epilepsy, unspecified, not intractable, without status epilepticus: Secondary | ICD-10-CM | POA: Insufficient documentation

## 2022-06-21 DIAGNOSIS — F1910 Other psychoactive substance abuse, uncomplicated: Secondary | ICD-10-CM | POA: Insufficient documentation

## 2022-06-21 DIAGNOSIS — N644 Mastodynia: Secondary | ICD-10-CM

## 2022-06-21 DIAGNOSIS — M549 Dorsalgia, unspecified: Secondary | ICD-10-CM

## 2022-06-21 DIAGNOSIS — I10 Essential (primary) hypertension: Secondary | ICD-10-CM | POA: Insufficient documentation

## 2022-06-21 NOTE — Progress Notes (Signed)
BP 133/82   Pulse 85   Temp (!) 97.5 F (36.4 C)   Wt 204 lb (92.5 kg)   SpO2 93%   BMI 30.13 kg/m    Subjective:    Patient ID: Katrina Good, female    DOB: 13-Nov-1970, 51 y.o.   MRN: [id number]  HPI: TAHIRY SPICER is a 51 y.o. female presenting on 06/21/2022 for Hypertension   HPI   Chief Complaint  Patient presents with   Hypertension      She is using 3 inhalers but didn't bring them and doesn't remember what they're called (only 1 is on her med list)  She says her anxiety fluctuates, getting worse then better and worse and better.   She is Stressed due to family issues. Also She has been living in and out of car with her mother.    She c/o Hurting back, knees, and feet.  For almost a month.  No injury.    She says her living and transportation issues is why she has missed her neurology appt.  She was Last seen by neuro was November 2022.    Dismissal note 05/26/22.   - [facility]  Her son & daughter-in-law live in town and that's where her meds get delivered to.   She cancelled her diagnostic mammogram due to she was scared.  She says her breast is still sore.  She was squeezing it.   She stopped squeezing it but it's still sore.   Also the Nipple is sore-   right breast is only one hurting.  She is still using cocaine.  Pt says she is having problems sleeping lately.           Relevant past medical, surgical, family and social history reviewed and updated as indicated. Interim medical history since our last visit reviewed. Allergies and medications reviewed and updated.   Current Outpatient Medications:    albuterol (VENTOLIN HFA) 108 (90 Base) MCG/ACT inhaler, INHALE 2 PUFFS BY MOUTH EVERY 6 HOURS AS NEEDED FOR COUGHING, WHEEZING, OR SHORTNESS OF BREATH, Disp: 20.1 g, Rfl: 0   amLODipine (NORVASC) 5 MG tablet, TAKE 1 Tablet BY MOUTH ONCE EVERY DAY, Disp: 90 tablet, Rfl: 0   levETIRAcetam (KEPPRA) 1000 MG tablet, 500mg  AM and 1000mg  PM, Disp: 45 tablet,  Rfl: 5   aspirin 81 MG chewable tablet, Chew by mouth daily., Disp: , Rfl:    naproxen sodium (ALEVE) 220 MG tablet, Take 220 mg by mouth., Disp: , Rfl:     Review of Systems  Per HPI unless specifically indicated above     Objective:    BP 133/82   Pulse 85   Temp (!) 97.5 F (36.4 C)   Wt 204 lb (92.5 kg)   SpO2 93%   BMI 30.13 kg/m   Wt Readings from Last 3 Encounters:  06/21/22 204 lb (92.5 kg)  03/16/22 202 lb (91.6 kg)  02/23/22 202 lb 4.8 oz (91.8 kg)    Physical Exam Vitals reviewed.  Constitutional:      General: She is not in acute distress.    Appearance: She is well-developed. She is not toxic-appearing.  HENT:     Head: Normocephalic and atraumatic.  Cardiovascular:     Rate and Rhythm: Normal rate and regular rhythm.  Pulmonary:     Effort: Pulmonary effort is normal.     Breath sounds: Normal breath sounds.  Chest:  Breasts:    Right: Tenderness present. No swelling, bleeding, inverted nipple, mass,  nipple discharge or skin change.  Abdominal:     General: Bowel sounds are normal.     Palpations: Abdomen is soft. There is no mass.     Tenderness: There is no abdominal tenderness.  Musculoskeletal:     Cervical back: Neck supple.     Thoracic back: Tenderness present. No bony tenderness.     Lumbar back: Tenderness present. No bony tenderness. Negative right straight leg raise test and negative left straight leg raise test.     Right hip: Tenderness present. No deformity or bony tenderness. Normal range of motion.     Right upper leg: No swelling or tenderness.     Right knee: No swelling or deformity. Normal range of motion.     Right lower leg: No edema.     Left lower leg: No edema.     Right ankle: No swelling.     Right foot: Normal pulse.     Left foot: Normal pulse.     Comments: Pain with ROM R hip.  Fungus toenails  Lymphadenopathy:     Cervical: No cervical adenopathy.  Skin:    General: Skin is warm and dry.  Neurological:      Mental Status: She is alert and oriented to person, place, and time.  Psychiatric:        Behavior: Behavior normal.            Assessment & Plan:     Encounter Diagnoses  Name Primary?   Primary hypertension Yes   Seizure disorder (HCC)    Anxiety    Tobacco use disorder    Substance abuse (HCC)    Pain of right hip    Bilateral back pain, unspecified back location, unspecified chronicity    Breast pain, right    Onychomycosis of toenail      -pt new phone number  [phone] -pt encouraged to contact care connect to Update enrollment and renew medassist and submit application for [employer] financial assistance -Will refer to new neurologist for seizure disorder.  Discussed with pt that she must go to her appointments and reschedule in a timely fashion if she is going to be unable to make it to the appointment.   Discussed that this office is also in [place] -Xray right hip -pt is given application for [facility] -this office Hopes to get counselor soon.  Pt can go to [facility] if desires counseling now.   -will reschedule  diagnostic mammogram.  Pt encouraged to avoid mashing her breast -pt to F/u 1 month since she has a lot going on today.

## 2022-06-23 ENCOUNTER — Ambulatory Visit (HOSPITAL_COMMUNITY)
Admission: RE | Admit: 2022-06-23 | Discharge: 2022-06-23 | Disposition: A | Discharge status: Home / Self Care | Payer: Self-pay | Source: Ambulatory Visit | Attending: Physician Assistant | Admitting: Physician Assistant

## 2022-06-23 ENCOUNTER — Other Ambulatory Visit: Payer: Self-pay | Admitting: Physician Assistant

## 2022-06-23 DIAGNOSIS — M25551 Pain in right hip: Secondary | ICD-10-CM | POA: Insufficient documentation

## 2022-06-23 DIAGNOSIS — M549 Dorsalgia, unspecified: Secondary | ICD-10-CM | POA: Insufficient documentation

## 2022-06-29 ENCOUNTER — Encounter: Payer: Self-pay | Admitting: Neurology

## 2022-07-06 ENCOUNTER — Ambulatory Visit: Payer: Self-pay

## 2022-07-22 ENCOUNTER — Ambulatory Visit: Payer: Self-pay | Admitting: Physician Assistant

## 2022-07-22 ENCOUNTER — Encounter: Payer: Self-pay | Admitting: Physician Assistant

## 2022-07-22 VITALS — BP 141/89 | HR 86 | Temp 97.7°F

## 2022-07-22 DIAGNOSIS — M25551 Pain in right hip: Secondary | ICD-10-CM

## 2022-07-22 DIAGNOSIS — I10 Essential (primary) hypertension: Secondary | ICD-10-CM

## 2022-07-22 DIAGNOSIS — F419 Anxiety disorder, unspecified: Secondary | ICD-10-CM

## 2022-07-22 DIAGNOSIS — R609 Edema, unspecified: Secondary | ICD-10-CM

## 2022-07-22 DIAGNOSIS — G40909 Epilepsy, unspecified, not intractable, without status epilepticus: Secondary | ICD-10-CM

## 2022-07-22 MED ORDER — PREDNISONE 20 MG PO TABS: 20.0000 mg | ORAL_TABLET | Freq: Two times a day (BID) | ORAL | 0 refills | Status: AC

## 2022-07-22 NOTE — Addendum Note (Signed)
Addended by: Soyla Dryer on: 07/22/2022 01:40 PM   Modules accepted: Orders

## 2022-07-22 NOTE — Progress Notes (Signed)
BP (!) 141/89   Pulse 86   Temp 97.7 F (36.5 C)   SpO2 98%    Subjective:    Patient ID: Katrina Good, female    DOB: 01-04-1971, 51 y.o.   MRN: [id number]  HPI: Katrina Good is a 51 y.o. female presenting on 07/22/2022 for No chief complaint on file.   HPI  Pt is in for follow up.   She says Her feet hurt.  She says they sometimes they swell.    She Didn't submit her [employer] financial assistance application She says she updated her enrollment but it isn't in fhases   She has appt with neurology Oct 6 for seizures.    She says her right hip is still hurting.  She got xray done after her last appointment here.   She says she is still using drugs but is trying to cut that back.   Pt reports vaginal discharge.   Relevant past medical, surgical, family and social history reviewed and updated as indicated. Interim medical history since our last visit reviewed. Allergies and medications reviewed and updated.   Current Outpatient Medications:    albuterol (VENTOLIN HFA) 108 (90 Base) MCG/ACT inhaler, INHALE 2 PUFFS BY MOUTH EVERY 6 HOURS AS NEEDED FOR COUGHING, WHEEZING, OR SHORTNESS OF BREATH, Disp: 20.1 g, Rfl: 0   amLODipine (NORVASC) 5 MG tablet, TAKE 1 Tablet BY MOUTH ONCE EVERY DAY, Disp: 90 tablet, Rfl: 0   aspirin 81 MG chewable tablet, Chew by mouth daily., Disp: , Rfl:    levETIRAcetam (KEPPRA) 1000 MG tablet, 500mg  AM and 1000mg  PM, Disp: 45 tablet, Rfl: 5   naproxen sodium (ALEVE) 220 MG tablet, Take 220 mg by mouth., Disp: , Rfl:     Review of Systems  Per HPI unless specifically indicated above     Objective:    BP (!) 141/89   Pulse 86   Temp 97.7 F (36.5 C)   SpO2 98%   Wt Readings from Last 3 Encounters:  06/21/22 204 lb (92.5 kg)  03/16/22 202 lb (91.6 kg)  02/23/22 202 lb 4.8 oz (91.8 kg)    Physical Exam Vitals reviewed.  Constitutional:      General: She is not in acute distress.    Appearance: She is well-developed.  She is not toxic-appearing.  HENT:     Head: Normocephalic and atraumatic.  Cardiovascular:     Rate and Rhythm: Normal rate and regular rhythm.  Pulmonary:     Effort: Pulmonary effort is normal.     Breath sounds: Normal breath sounds.  Abdominal:     General: Bowel sounds are normal.     Palpations: Abdomen is soft. There is no mass.     Tenderness: There is no abdominal tenderness.  Musculoskeletal:     Cervical back: Neck supple.     Right hip: Tenderness present. Normal range of motion.     Right lower leg: Edema present.     Left lower leg: Edema present.     Comments: Pedal edema.  No pre-tibial edema.  Dp pulses 2+ Right hip with pain on ROM.  + lateral tenderness.    Lymphadenopathy:     Cervical: No cervical adenopathy.  Skin:    General: Skin is warm and dry.  Neurological:     Mental Status: She is alert and oriented to person, place, and time.  Psychiatric:        Behavior: Behavior normal.  Assessment & Plan:   Encounter Diagnoses  Name Primary?   Primary hypertension Yes   Pain of right hip    Seizure disorder (HCC)    Anxiety    Edema, unspecified type       anxiety -She wants to wait on [facility] since she has a lot going on right now.   R Hip pain. -reviewed hip xray with pt -Refer to ortho for hip.  Possibly bursitis.   Trial of prednisone since is will likely be a month before she is seen (since she hasn't yet submitted her application for financial assistance)  edema -Elevate feet -Low sodium diet  uninsured -pt is urged to submit her [employer]/application for [employer] financial assistance -she is encouraged to update her enrollment for [facility] through [facility]  HCM -mAmmogram as schedued  seizures -pt is urged to make it to her Neurology appointment on time as scheduled  Discharge -she is scheduled to RTO next week as she doesn't have time for evaluation today

## 2022-07-22 NOTE — Patient Instructions (Signed)
Low-Sodium Eating Plan Sodium, which is an element that makes up salt, helps you maintain a healthy balance of fluids in your body. Too much sodium can increase your blood pressure and cause fluid and waste to be held in your body. Your health care provider or dietitian may recommend following this plan if you have high blood pressure (hypertension), kidney disease, liver disease, or heart failure. Eating less sodium can help lower your blood pressure, reduce swelling, and protect your heart, liver, and kidneys. What are tips for following this plan? Reading food labels The Nutrition Facts label lists the amount of sodium in one serving of the food. If you eat more than one serving, you must multiply the listed amount of sodium by the number of servings. Choose foods with less than 140 mg of sodium per serving. Avoid foods with 300 mg of sodium or more per serving. Shopping  Look for lower-sodium products, often labeled as "low-sodium" or "no salt added." Always check the sodium content, even if foods are labeled as "unsalted" or "no salt added." Buy fresh foods. Avoid canned foods and pre-made or frozen meals. Avoid canned, cured, or processed meats. Buy breads that have less than 80 mg of sodium per slice. Cooking  Eat more home-cooked food and less restaurant, buffet, and fast food. Avoid adding salt when cooking. Use salt-free seasonings or herbs instead of table salt or sea salt. Check with your health care provider or pharmacist before using salt substitutes. Cook with plant-based oils, such as canola, sunflower, or olive oil. Meal planning When eating at a restaurant, ask that your food be prepared with less salt or no salt, if possible. Avoid dishes labeled as brined, pickled, cured, smoked, or made with soy sauce, miso, or teriyaki sauce. Avoid foods that contain MSG (monosodium glutamate). MSG is sometimes added to Chinese food, bouillon, and some canned foods. Make meals that can  be grilled, baked, poached, roasted, or steamed. These are generally made with less sodium. General information Most people on this plan should limit their sodium intake to 1,500-2,000 mg (milligrams) of sodium each day. What foods should I eat? Fruits Fresh, frozen, or canned fruit. Fruit juice. Vegetables Fresh or frozen vegetables. "No salt added" canned vegetables. "No salt added" tomato sauce and paste. Low-sodium or reduced-sodium tomato and vegetable juice. Grains Low-sodium cereals, including oats, puffed wheat and rice, and shredded wheat. Low-sodium crackers. Unsalted rice. Unsalted pasta. Low-sodium bread. Whole-grain breads and whole-grain pasta. Meats and other proteins Fresh or frozen (no salt added) meat, poultry, seafood, and fish. Low-sodium canned tuna and salmon. Unsalted nuts. Dried peas, beans, and lentils without added salt. Unsalted canned beans. Eggs. Unsalted nut butters. Dairy Milk. Soy milk. Cheese that is naturally low in sodium, such as ricotta cheese, fresh mozzarella, or Swiss cheese. Low-sodium or reduced-sodium cheese. Cream cheese. Yogurt. Seasonings and condiments Fresh and dried herbs and spices. Salt-free seasonings. Low-sodium mustard and ketchup. Sodium-free salad dressing. Sodium-free light mayonnaise. Fresh or refrigerated horseradish. Lemon juice. Vinegar. Other foods Homemade, reduced-sodium, or low-sodium soups. Unsalted popcorn and pretzels. Low-salt or salt-free chips. The items listed above may not be a complete list of foods and beverages you can eat. Contact a dietitian for more information. What foods should I avoid? Vegetables Sauerkraut, pickled vegetables, and relishes. Olives. French fries. Onion rings. Regular canned vegetables (not low-sodium or reduced-sodium). Regular canned tomato sauce and paste (not low-sodium or reduced-sodium). Regular tomato and vegetable juice (not low-sodium or reduced-sodium). Frozen vegetables in  sauces. Grains   Instant hot cereals. Bread stuffing, pancake, and biscuit mixes. Croutons. Seasoned rice or pasta mixes. Noodle soup cups. Boxed or frozen macaroni and cheese. Regular salted crackers. Self-rising flour. Meats and other proteins Meat or fish that is salted, canned, smoked, spiced, or pickled. Precooked or cured meat, such as sausages or meat loaves. Bacon. Ham. Pepperoni. Hot dogs. Corned beef. Chipped beef. Salt pork. Jerky. Pickled herring. Anchovies and sardines. Regular canned tuna. Salted nuts. Dairy Processed cheese and cheese spreads. Hard cheeses. Cheese curds. Blue cheese. Feta cheese. String cheese. Regular cottage cheese. Buttermilk. Canned milk. Fats and oils Salted butter. Regular margarine. Ghee. Bacon fat. Seasonings and condiments Onion salt, garlic salt, seasoned salt, table salt, and sea salt. Canned and packaged gravies. Worcestershire sauce. Tartar sauce. Barbecue sauce. Teriyaki sauce. Soy sauce, including reduced-sodium. Steak sauce. Fish sauce. Oyster sauce. Cocktail sauce. Horseradish that you find on the shelf. Regular ketchup and mustard. Meat flavorings and tenderizers. Bouillon cubes. Hot sauce. Pre-made or packaged marinades. Pre-made or packaged taco seasonings. Relishes. Regular salad dressings. Salsa. Other foods Salted popcorn and pretzels. Corn chips and puffs. Potato and tortilla chips. Canned or dried soups. Pizza. Frozen entrees and pot pies. The items listed above may not be a complete list of foods and beverages you should avoid. Contact a dietitian for more information. Summary Eating less sodium can help lower your blood pressure, reduce swelling, and protect your heart, liver, and kidneys. Most people on this plan should limit their sodium intake to 1,500-2,000 mg (milligrams) of sodium each day. Canned, boxed, and frozen foods are high in sodium. Restaurant foods, fast foods, and pizza are also very high in sodium. You also get sodium by  adding salt to food. Try to cook at home, eat more fresh fruits and vegetables, and eat less fast food and canned, processed, or prepared foods. This information is not intended to replace advice given to you by your health care provider. Make sure you discuss any questions you have with your health care provider. Document Revised: 11/23/2019 Document Reviewed: 09/19/2019 Elsevier Patient Education  2023 Elsevier Inc.  

## 2022-07-27 ENCOUNTER — Encounter: Payer: Self-pay | Admitting: Physician Assistant

## 2022-07-27 ENCOUNTER — Ambulatory Visit: Payer: Self-pay | Admitting: Physician Assistant

## 2022-07-27 VITALS — BP 140/90 | HR 66 | Temp 97.4°F | Wt 203.0 lb

## 2022-07-27 DIAGNOSIS — M25551 Pain in right hip: Secondary | ICD-10-CM

## 2022-07-27 DIAGNOSIS — R609 Edema, unspecified: Secondary | ICD-10-CM

## 2022-07-27 DIAGNOSIS — I10 Essential (primary) hypertension: Secondary | ICD-10-CM

## 2022-07-27 DIAGNOSIS — G40909 Epilepsy, unspecified, not intractable, without status epilepticus: Secondary | ICD-10-CM

## 2022-07-27 MED ORDER — LOSARTAN POTASSIUM 50 MG PO TABS: 50.0000 mg | ORAL_TABLET | Freq: Every day | ORAL | 0 refills | Status: DC

## 2022-07-27 NOTE — Progress Notes (Signed)
BP (!) 140/90   Pulse 66   Temp (!) 97.4 F (36.3 C)   Wt 203 lb (92.1 kg)   SpO2 96%   BMI 29.98 kg/m    Subjective:    Patient ID: Katrina Good, female    DOB: [birth year], 51 y.o.   MRN: [id number]  HPI: Katrina Good is a 51 y.o. female presenting on 07/27/2022 for Follow-up (Pt states she is not here today for vaginal discharge - pt states this is resolved. Pt states she is here about her blood pressure and her leg. Pt c/o R leg swelling and pain. Pt states she has been elevating her leg "all day every day". Pt states her pain is from R hip down to her R ankle)   HPI    Pt appointment today is for vaginal discharge.   She was seen last week for other issues.   She was late for that appointment and was in a hurry to be gone.   She was late for her appointment today by 15 minutes.       She didn't get the prednisone given for her hip at appointment last week.   She was referred to orthopedics but that appointment has not yet been scheduled.  At appointment last week, pt was reminded to update enrollment and her [employer] financial assistance.  She has not yet done this.    Pt is unhappy with low sodium diet; she says she doesn't like it.   She is also unhappy that she is still having swelling of her ankles.    Relevant past medical, surgical, family and social history reviewed and updated as indicated. Interim medical history since our last visit reviewed. Allergies and medications reviewed and updated.   Current Outpatient Medications:    albuterol (VENTOLIN HFA) 108 (90 Base) MCG/ACT inhaler, INHALE 2 PUFFS BY MOUTH EVERY 6 HOURS AS NEEDED FOR COUGHING, WHEEZING, OR SHORTNESS OF BREATH, Disp: 20.1 g, Rfl: 0   amLODipine (NORVASC) 5 MG tablet, TAKE 1 Tablet BY MOUTH ONCE EVERY DAY, Disp: 90 tablet, Rfl: 0   aspirin 81 MG chewable tablet, Chew by mouth daily., Disp: , Rfl:    levETIRAcetam (KEPPRA) 1000 MG tablet, 500mg  AM and 1000mg  PM, Disp: 45 tablet, Rfl: 5    naproxen sodium (ALEVE) 220 MG tablet, Take 220 mg by mouth., Disp: , Rfl:    predniSONE (DELTASONE) 20 MG tablet, Take 1 tablet (20 mg total) by mouth 2 (two) times daily with a meal for 5 days. (Patient not taking: Reported on 07/27/2022), Disp: 10 tablet, Rfl: 0    Review of Systems  Per HPI unless specifically indicated above     Objective:    BP (!) 140/90   Pulse 66   Temp (!) 97.4 F (36.3 C)   Wt 203 lb (92.1 kg)   SpO2 96%   BMI 29.98 kg/m   Wt Readings from Last 3 Encounters:  07/27/22 203 lb (92.1 kg)  06/21/22 204 lb (92.5 kg)  03/16/22 202 lb (91.6 kg)    Physical Exam Vitals reviewed.  Constitutional:      General: She is not in acute distress.    Appearance: She is well-developed. She is not toxic-appearing.  HENT:     Head: Normocephalic and atraumatic.  Cardiovascular:     Rate and Rhythm: Normal rate and regular rhythm.  Pulmonary:     Effort: Pulmonary effort is normal.     Breath sounds: Normal breath sounds.  Abdominal:     General: Bowel sounds are normal.     Palpations: Abdomen is soft. There is no mass.     Tenderness: There is no abdominal tenderness.  Musculoskeletal:     Cervical back: Neck supple.     Right lower leg: Edema present.     Left lower leg: Edema present.     Comments: Trace, around the feet and ankles.  No pre-tibial edema.   Lymphadenopathy:     Cervical: No cervical adenopathy.  Skin:    General: Skin is warm and dry.  Neurological:     Mental Status: She is alert and oriented to person, place, and time.  Psychiatric:        Behavior: Behavior normal.           Assessment & Plan:    Encounter Diagnoses  Name Primary?   Primary hypertension Yes   Edema, unspecified type    Seizure disorder ([place])    Pain of right hip      -will discontinue the amlodipine to see if swelling improves.    -rx losartan -pt is encouraged to continue low sodium diet and elevate feet when seated.   -she is to follow up  4  wk  -Pt was reminded to show up, on time,  for her neurology appointment next week.  She is urged to call and cancel if she isn't going to make it (at least 24 hour in advance) -reminded pt that we discussed her hip xray at her appointment last week, prednisone was prescribed and she was referred to orthopedics -discussed with pt that her enrollment is expired and she will have to update that before her next appointment. -she is encouraged to update her [employer] care as well

## 2022-08-06 ENCOUNTER — Ambulatory Visit: Payer: Self-pay | Admitting: Neurology

## 2022-08-06 ENCOUNTER — Encounter: Payer: Self-pay | Admitting: Neurology

## 2022-08-06 DIAGNOSIS — Z029 Encounter for administrative examinations, unspecified: Secondary | ICD-10-CM

## 2022-08-09 ENCOUNTER — Other Ambulatory Visit: Payer: Self-pay | Admitting: Physician Assistant

## 2022-08-10 ENCOUNTER — Other Ambulatory Visit: Payer: Self-pay | Admitting: Physician Assistant

## 2022-08-10 DIAGNOSIS — I10 Essential (primary) hypertension: Secondary | ICD-10-CM

## 2022-08-20 ENCOUNTER — Inpatient Hospital Stay: Payer: MEDICAID | Attending: Physician Assistant

## 2022-08-23 ENCOUNTER — Other Ambulatory Visit: Payer: Self-pay

## 2022-08-23 ENCOUNTER — Emergency Department (HOSPITAL_COMMUNITY): Payer: Self-pay

## 2022-08-23 ENCOUNTER — Encounter (HOSPITAL_COMMUNITY): Payer: Self-pay

## 2022-08-23 ENCOUNTER — Emergency Department (HOSPITAL_COMMUNITY)
Admission: EM | Admit: 2022-08-23 | Discharge: 2022-08-23 | Disposition: A | Discharge status: Home / Self Care | Payer: Self-pay | Attending: Emergency Medicine | Admitting: Emergency Medicine

## 2022-08-23 DIAGNOSIS — J069 Acute upper respiratory infection, unspecified: Secondary | ICD-10-CM | POA: Insufficient documentation

## 2022-08-23 DIAGNOSIS — Z1152 Encounter for screening for COVID-19: Secondary | ICD-10-CM | POA: Insufficient documentation

## 2022-08-23 DIAGNOSIS — Z7982 Long term (current) use of aspirin: Secondary | ICD-10-CM | POA: Insufficient documentation

## 2022-08-23 DIAGNOSIS — J168 Pneumonia due to other specified infectious organisms: Secondary | ICD-10-CM | POA: Insufficient documentation

## 2022-08-23 DIAGNOSIS — J189 Pneumonia, unspecified organism: Secondary | ICD-10-CM

## 2022-08-23 LAB — RESP PANEL BY RT-PCR (FLU A&B, COVID) ARPGX2
Influenza A by PCR: NEGATIVE
Influenza B by PCR: NEGATIVE
SARS Coronavirus 2 by RT PCR: NEGATIVE

## 2022-08-23 MED ORDER — IBUPROFEN 800 MG PO TABS
800.0000 mg | ORAL_TABLET | Freq: Once | ORAL | Status: AC
  Administered 2022-08-23: 800 mg via ORAL
  Filled 2022-08-23: qty 1

## 2022-08-23 MED ORDER — IPRATROPIUM-ALBUTEROL 0.5-2.5 (3) MG/3ML IN SOLN
3.0000 mL | Freq: Once | RESPIRATORY_TRACT | Status: AC
  Administered 2022-08-23: 3 mL via RESPIRATORY_TRACT
  Filled 2022-08-23: qty 3

## 2022-08-23 MED ORDER — AMOXICILLIN-POT CLAVULANATE 875-125 MG PO TABS: 1.0000 | ORAL_TABLET | Freq: Two times a day (BID) | ORAL | 0 refills | Status: AC

## 2022-08-23 MED ORDER — GUAIFENESIN-DM 100-10 MG/5ML PO SYRP
15.0000 mL | ORAL_SOLUTION | Freq: Once | ORAL | Status: AC
  Administered 2022-08-23: 15 mL via ORAL
  Filled 2022-08-23: qty 15

## 2022-08-23 MED ORDER — IBUPROFEN 600 MG PO TABS: 600.0000 mg | ORAL_TABLET | Freq: Four times a day (QID) | ORAL | 0 refills | Status: DC | PRN

## 2022-08-23 MED ORDER — GUAIFENESIN 100 MG/5ML PO LIQD: 15.0000 mL | Freq: Four times a day (QID) | ORAL | 0 refills | Status: AC | PRN

## 2022-08-23 MED ORDER — LOSARTAN POTASSIUM 25 MG PO TABS
50.0000 mg | ORAL_TABLET | Freq: Every day | ORAL | Status: DC
  Administered 2022-08-23: 50 mg via ORAL
  Filled 2022-08-23: qty 2

## 2022-08-23 MED ORDER — ONDANSETRON 4 MG PO TBDP
4.0000 mg | ORAL_TABLET | Freq: Once | ORAL | Status: AC
Start: 2022-08-23 — End: 2022-08-23
  Administered 2022-08-23: 4 mg via ORAL
  Filled 2022-08-23: qty 1

## 2022-08-23 NOTE — ED Triage Notes (Signed)
Patient states she has a cold with diarrhea for 2 weeks with sore throat, dry cough. Patient smokes cigarettes. Patient states she has been around someone who has been sick recently.

## 2022-08-23 NOTE — Discharge Instructions (Addendum)
Rest, increase vitamin C intake, increase oral hydration, Robitussin for mucus production, Motrin or Tylenol for pain or fevers, and antibiotics for pneumonia.  Follow up with primary care physician in the next 7 to 10 days if symptoms of cough does not improve

## 2022-08-23 NOTE — ED Notes (Signed)
Pt d/c home per MD order. Discharge summary reviewed with pt, pt verbalizes understanding. Ambulatory off unit. No s/s of acute distress noted at discharge.  °

## 2022-08-23 NOTE — ED Provider Notes (Signed)
[facility] EMERGENCY DEPARTMENT Provider Note   CSN: [id number] Arrival date & time: 08/23/22  0915     History  Chief Complaint  Patient presents with   URI    Katrina Good is a 51 y.o. female.  Pt is a 51 yo female presenting for cough. Pt admits to chills, nausea, vomiting, diarrhea, and cough x 3 weeks. No improvement with Nitequil, Dayquil, Psudeofed, Advil cough. Admits to sick contacts.   The history is provided by the patient. No language interpreter was used.  URI Presenting symptoms: cough   Presenting symptoms: no ear pain, no fever and no sore throat   Associated symptoms: no arthralgias        Home Medications Prior to Admission medications   Medication Sig Start Date End Date Taking? Authorizing Provider  amoxicillin-clavulanate (AUGMENTIN) 875-125 MG tablet Take 1 tablet by mouth 2 (two) times daily for 7 days. 08/23/22 [id number] Yes Campbell Stall P, DO  guaiFENesin (ROBITUSSIN) 100 MG/5ML liquid Take 15 mLs by mouth every 6 (six) hours as needed for cough or to loosen phlegm. [id number]  Yes Campbell Stall P, DO  ibuprofen (ADVIL) 600 MG tablet Take 1 tablet (600 mg total) by mouth every 6 (six) hours as needed for moderate pain, mild pain, headache or fever. [id number]  Yes Campbell Stall P, DO  albuterol (VENTOLIN HFA) 108 (90 Base) MCG/ACT inhaler INHALE 2 PUFFS BY MOUTH EVERY 6 HOURS AS NEEDED FOR COUGHING, WHEEZING, OR SHORTNESS OF BREATH 08/09/22   Soyla Dryer, PA-C  aspirin 81 MG chewable tablet Chew by mouth daily.    [provider]  levETIRAcetam (KEPPRA) 1000 MG tablet 500mg  AM and 1000mg  PM 09/14/21   Frann Rider, NP  losartan (COZAAR) 50 MG tablet Take 1 tablet (50 mg total) by mouth daily. 07/27/22   Soyla Dryer, PA-C  naproxen sodium (ALEVE) 220 MG tablet Take 220 mg by mouth.    [provider]      Allergies    Doxycycline and Strawberry extract    Review of Systems   Review of Systems  Constitutional:  Negative  for chills and fever.  HENT:  Negative for ear pain and sore throat.   Eyes:  Negative for pain and visual disturbance.  Respiratory:  Positive for cough. Negative for shortness of breath.   Cardiovascular:  Negative for chest pain and palpitations.  Gastrointestinal:  Positive for diarrhea, nausea and vomiting. Negative for abdominal pain.  Genitourinary:  Negative for dysuria and hematuria.  Musculoskeletal:  Negative for arthralgias and back pain.  Skin:  Negative for color change and rash.  Neurological:  Negative for seizures and syncope.  All other systems reviewed and are negative.   Physical Exam Updated Vital Signs BP (!) 160/102   Pulse 83   Temp 98.4 F (36.9 C) (Oral)   Resp 18   Ht 5\' 9"  (1.753 m)   Wt 83.9 kg   SpO2 98%   BMI 27.32 kg/m  Physical Exam Vitals and nursing note reviewed.  Constitutional:      General: She is not in acute distress.    Appearance: She is well-developed.  HENT:     Head: Normocephalic and atraumatic.  Eyes:     Conjunctiva/sclera: Conjunctivae normal.  Cardiovascular:     Rate and Rhythm: Normal rate and regular rhythm.     Heart sounds: No murmur heard. Pulmonary:     Effort: Pulmonary effort is normal. No respiratory distress.     Breath sounds: Normal  breath sounds.  Abdominal:     Palpations: Abdomen is soft.     Tenderness: There is no abdominal tenderness.  Musculoskeletal:        General: No swelling.     Cervical back: Neck supple.  Skin:    General: Skin is warm and dry.     Capillary Refill: Capillary refill takes less than 2 seconds.  Neurological:     Mental Status: She is alert.  Psychiatric:        Mood and Affect: Mood normal.     ED Results / Procedures / Treatments   Labs (all labs ordered are listed, but only abnormal results are displayed) Labs Reviewed  RESP PANEL BY RT-PCR (FLU A&B, COVID) [id number]    EKG None  Radiology DG Chest Portable 1 View  Result Date: 08/23/2022 CLINICAL DATA:   Cough EXAM: PORTABLE CHEST 1 VIEW COMPARISON:  chest x-ray dated August 06, 2021 FINDINGS: Cardiac and mediastinal contours are within normal limits. Mild right lower lobe opacity, likely scarring or atelectasis. Lungs otherwise clear. No large pleural effusion or pneumothorax. IMPRESSION: Mild right lower lobe opacity, likely scarring or atelectasis. Electronically Signed   By: Yetta Glassman M.D.   On: 08/23/2022 10:20    Procedures Procedures    Medications Ordered in ED Medications  losartan (COZAAR) tablet 50 mg (50 mg Oral Given 08/23/22 1018)  ibuprofen (ADVIL) tablet 800 mg (800 mg Oral Given 08/23/22 1018)  guaiFENesin-dextromethorphan (ROBITUSSIN DM) 100-10 MG/5ML syrup 15 mL (15 mLs Oral Given 08/23/22 1018)  ondansetron (ZOFRAN-ODT) disintegrating tablet 4 mg (4 mg Oral Given 08/23/22 1018)  ipratropium-albuterol (DUONEB) 0.5-2.5 (3) MG/3ML nebulizer solution 3 mL (3 mLs Nebulization Given 08/23/22 1022)    ED Course/ Medical Decision Making/ A&P                           Medical Decision Making Amount and/or Complexity of Data Reviewed Radiology: ordered.  Risk OTC drugs. Prescription drug management.   11:20 AM Patient is a 51 year old female presenting for chills, nausea, diarrhea, cough after contact with sick neighbor x2 weeks.  Patient is alert and oriented x3, no acute distress, afebrile, so vital signs.  Patient states she did not take her blood pressure medications this morning.  Will order her home meds.  Blood pressure currently 160/102.  Otherwise patient is in no acute respiratory distress.  No hypoxia.  No tachypnea.  Patient has equal bilateral breath sounds with minimal wheezing.  Current smoker.  Will order DuoNeb treatment.  Has albuterol at the house.  Chest x-ray demonstrates opacities concerning for focal pneumonia.  Antibiotics ordered.  No signs or symptoms of sepsis.  We will hold off on blood work at this time.  Patient is otherwise tolerating p.o.  without difficulty.  COVID PCR and influenza negative.  Patient recommended to rest, increase vitamin C intake, increase oral hydration, Robitussin for mucus production, Motrin or Tylenol for pain or fevers, and antibiotics for pneumonia.  Patient in no distress and overall condition improved here in the ED. Detailed discussions were had with the patient regarding current findings, and need for close f/u with PCP or on call doctor. The patient has been instructed to return immediately if the symptoms worsen in any way for re-evaluation. Patient verbalized understanding and is in agreement with current care plan. All questions answered prior to discharge.        Final Clinical Impression(s) / ED Diagnoses Final diagnoses:  Pneumonia due to infectious organism, unspecified laterality, unspecified part of lung    Rx / DC Orders ED Discharge Orders          Ordered    guaiFENesin (ROBITUSSIN) 100 MG/5ML liquid  Every 6 hours PRN        08/23/22 1119    ibuprofen (ADVIL) 600 MG tablet  Every 6 hours PRN        08/23/22 1119    amoxicillin-clavulanate (AUGMENTIN) 875-125 MG tablet  2 times daily        08/23/22 1119              Lianne Cure, DO 65/46[birth year]50 1120

## 2022-08-24 ENCOUNTER — Ambulatory Visit: Payer: Self-pay | Admitting: Physician Assistant

## 2022-08-24 ENCOUNTER — Encounter (HOSPITAL_COMMUNITY): Payer: Self-pay

## 2022-08-24 ENCOUNTER — Encounter: Payer: Self-pay | Admitting: Physician Assistant

## 2022-08-24 VITALS — BP 150/100 | HR 90 | Temp 97.6°F | Wt 202.5 lb

## 2022-08-24 DIAGNOSIS — R6889 Other general symptoms and signs: Secondary | ICD-10-CM

## 2022-08-24 NOTE — Progress Notes (Signed)
   BP (!) 150/100   Pulse 90   Temp 97.6 F (36.4 C)   Wt 202 lb 8 oz (91.9 kg)   SpO2 99%   BMI 29.90 kg/m    Subjective:    Patient ID: Katrina Good, female    DOB: 1971-04-30, 51 y.o.   MRN: [id number]  HPI: Katrina Good is a 51 y.o. female presenting on 08/24/2022 for Hypertension and Edema   HPI  Pt was checked in by nurse but left prior to provider; stated she felt bad due to her CAP (she was seen in ER yesterday) and just wanted to go home.     Objective:    BP (!) 150/100   Pulse 90   Temp 97.6 F (36.4 C)   Wt 202 lb 8 oz (91.9 kg)   SpO2 99%   BMI 29.90 kg/m   Wt Readings from Last 3 Encounters:  08/24/22 202 lb 8 oz (91.9 kg)  08/23/22 185 lb (83.9 kg)  07/27/22 203 lb (92.1 kg)    Physical Exam        Assessment & Plan:     Encounter Diagnosis  Name Primary?   Feeling bad Yes

## 2022-09-01 ENCOUNTER — Other Ambulatory Visit (HOSPITAL_COMMUNITY)
Admission: RE | Admit: 2022-09-01 | Discharge: 2022-09-01 | Disposition: A | Discharge status: Home / Self Care | Payer: Self-pay | Source: Ambulatory Visit | Attending: Physician Assistant | Admitting: Physician Assistant

## 2022-09-01 ENCOUNTER — Encounter: Payer: Self-pay | Admitting: Physician Assistant

## 2022-09-01 ENCOUNTER — Ambulatory Visit (HOSPITAL_COMMUNITY)
Admission: RE | Admit: 2022-09-01 | Discharge: 2022-09-01 | Disposition: A | Discharge status: Home / Self Care | Payer: Self-pay | Source: Ambulatory Visit | Attending: Physician Assistant | Admitting: Physician Assistant

## 2022-09-01 ENCOUNTER — Ambulatory Visit: Payer: Self-pay | Admitting: Physician Assistant

## 2022-09-01 VITALS — BP 150/110 | HR 82 | Temp 96.5°F

## 2022-09-01 DIAGNOSIS — M79674 Pain in right toe(s): Secondary | ICD-10-CM | POA: Insufficient documentation

## 2022-09-01 DIAGNOSIS — Z5989 Other problems related to housing and economic circumstances: Secondary | ICD-10-CM

## 2022-09-01 DIAGNOSIS — Z5971 Insufficient health insurance coverage: Secondary | ICD-10-CM

## 2022-09-01 DIAGNOSIS — I10 Essential (primary) hypertension: Secondary | ICD-10-CM

## 2022-09-01 DIAGNOSIS — J189 Pneumonia, unspecified organism: Secondary | ICD-10-CM

## 2022-09-01 DIAGNOSIS — G40909 Epilepsy, unspecified, not intractable, without status epilepticus: Secondary | ICD-10-CM

## 2022-09-01 DIAGNOSIS — F1910 Other psychoactive substance abuse, uncomplicated: Secondary | ICD-10-CM

## 2022-09-01 DIAGNOSIS — F17200 Nicotine dependence, unspecified, uncomplicated: Secondary | ICD-10-CM

## 2022-09-01 LAB — BASIC METABOLIC PANEL
Anion gap: 7 (ref 5–15)
BUN: 13 mg/dL (ref 6–20)
CO2: 25 mmol/L (ref 22–32)
Calcium: 9 mg/dL (ref 8.9–10.3)
Chloride: 108 mmol/L (ref 98–111)
Creatinine, Ser: 0.92 mg/dL (ref 0.44–1.00)
GFR, Estimated: >60 mL/min (ref >60)
Glucose, Bld: 135 mg/dL — ABNORMAL HIGH (ref 70–99)
Potassium: 3.6 mmol/L (ref 3.5–5.1)
Sodium: 140 mmol/L (ref 135–145)

## 2022-09-01 MED ORDER — LOSARTAN POTASSIUM 100 MG PO TABS: 100.0000 mg | ORAL_TABLET | Freq: Every day | ORAL | 0 refills | Status: DC

## 2022-09-01 NOTE — Progress Notes (Signed)
BP (!) 150/110   Pulse 82   Temp (!) 96.5 F (35.8 C)   SpO2 99%    Subjective:    Patient ID: Katrina Good, female    DOB: 28-Oct-1971, 51 y.o.   MRN: [id number]  HPI: Katrina Good is a 51 y.o. female presenting on 09/01/2022 for Follow-up and Foot Injury (Pt states she kicked a brick wall this morning trying to kill a spider and has pain on her toes and the bottom of her R foot. Pt states it is painful to walk.)   HPI  Chief Complaint  Patient presents with   Follow-up   Foot Injury    Pt states she kicked a brick wall this morning trying to kill a spider and has pain on her toes and the bottom of her R foot. Pt states it is painful to walk.    Pt had appt here last week but left prior to being seen by provider due to she was not feeling well.    She is still coughing from CAP.  She is still taking her augmentin.   She says she just recently picked it up from the store.    She has appt with neurologist 09/10/22 (she r/s due to being sick) for her seizures  Pt c/o right foot pain after injury this morning.     Relevant past medical, surgical, family and social history reviewed and updated as indicated. Interim medical history since our last visit reviewed. Allergies and medications reviewed and updated.     Current Outpatient Medications:    albuterol (VENTOLIN HFA) 108 (90 Base) MCG/ACT inhaler, INHALE 2 PUFFS BY MOUTH EVERY 6 HOURS AS NEEDED FOR COUGHING, WHEEZING, OR SHORTNESS OF BREATH, Disp: 20.1 g, Rfl: 0   amoxicillin-clavulanate (AUGMENTIN) 875-125 MG tablet, Take 1 tablet by mouth 2 (two) times daily., Disp: , Rfl:    aspirin 81 MG chewable tablet, Chew by mouth daily., Disp: , Rfl:    guaiFENesin (ROBITUSSIN) 100 MG/5ML liquid, Take 15 mLs by mouth every 6 (six) hours as needed for cough or to loosen phlegm., Disp: 60 mL, Rfl: 0   levETIRAcetam (KEPPRA) 1000 MG tablet, 500mg  AM and 1000mg  PM, Disp: 45 tablet, Rfl: 5   losartan (COZAAR) 50 MG tablet, Take  1 tablet (50 mg total) by mouth daily., Disp: 30 tablet, Rfl: 0    Review of Systems  Per HPI unless specifically indicated above     Objective:    BP (!) 150/110   Pulse 82   Temp (!) 96.5 F (35.8 C)   SpO2 99%   Wt Readings from Last 3 Encounters:  08/24/22 202 lb 8 oz (91.9 kg)  08/23/22 185 lb (83.9 kg)  07/27/22 203 lb (92.1 kg)    Physical Exam Vitals and nursing note reviewed.  Constitutional:      General: She is not in acute distress.    Appearance: She is not toxic-appearing.  HENT:     Head: Normocephalic and atraumatic.  Cardiovascular:     Rate and Rhythm: Normal rate and regular rhythm.  Pulmonary:     Effort: Pulmonary effort is normal. No respiratory distress.     Breath sounds: Normal breath sounds. No wheezing.  Musculoskeletal:     Right lower leg: No edema.     Left lower leg: No edema.     Right foot: Swelling and tenderness present. No deformity. Normal pulse.     Comments: Right 2nd and 3rd toes with swelling  and tenderness.  Remainder foot non-tender.  There is no deformity or mis-alignment of the toes.  Pt in wheelchair due to request stating it hurts to walk.   Neurological:     Mental Status: She is alert and oriented to person, place, and time.  Psychiatric:        Behavior: Behavior is cooperative.            Assessment & Plan:   Encounter Diagnoses  Name Primary?   Primary hypertension Yes   Pain in right toe(s)    Community acquired pneumonia, unspecified laterality    Uninsured    Tobacco use disorder    Substance abuse (HCC)    Seizure disorder (HCC)      -Increase losartan. Pt reminded to get bmp labs drawn -Pt is encouraged to submit cafa/application for [employer] financial assistance- she never turned it in -Xray right foot.  Pt encouraged to ice and elevate.  She can get cast shoe if desired.  -pt encouraged to complete antibiotics given to her for CAP -pt will follow up 1 month to recheck bp.  She is to  contact office sooner prn

## 2022-09-06 ENCOUNTER — Other Ambulatory Visit: Payer: Self-pay | Admitting: Physician Assistant

## 2022-09-06 DIAGNOSIS — S92511S Displaced fracture of proximal phalanx of right lesser toe(s), sequela: Secondary | ICD-10-CM

## 2022-09-10 ENCOUNTER — Encounter: Payer: Self-pay | Admitting: Neurology

## 2022-09-10 ENCOUNTER — Ambulatory Visit (INDEPENDENT_AMBULATORY_CARE_PROVIDER_SITE_OTHER): Payer: Self-pay | Admitting: Neurology

## 2022-09-10 VITALS — BP 157/96 | HR 87 | Resp 18 | Ht 69.0 in | Wt 202.0 lb

## 2022-09-10 DIAGNOSIS — R569 Unspecified convulsions: Secondary | ICD-10-CM

## 2022-09-10 MED ORDER — LEVETIRACETAM 1000 MG PO TABS: ORAL_TABLET | ORAL | 3 refills | Status: DC

## 2022-09-10 NOTE — Patient Instructions (Signed)
Good to meet you.  Schedule EEG  2. Continue Keppra (Levetiracetam) 1000mg : Take 1/2 tablet in AM, 1 tablet in PM  3. Keep a calendar of the seizures and any triggers. Please try to take a video of one of the seizures, if able  4. Follow-up in 3 months, call for any changes   Seizure Precautions: 1. If medication has been prescribed for you to prevent seizures, take it exactly as directed.  Do not stop taking the medicine without talking to your doctor first, even if you have not had a seizure in a long time.   2. Avoid activities in which a seizure would cause danger to yourself or to others.  Don't operate dangerous machinery, swim alone, or climb in high or dangerous places, such as on ladders, roofs, or girders.  Do not drive unless your doctor says you may.  3. If you have any warning that you may have a seizure, lay down in a safe place where you can't hurt yourself.    4.  No driving for 6 months from last seizure, as per [facility].   Please refer to the following link on the [employer]ile[employer] of [employer]'s website for more information: http://www.epilepsyfoundation.org/answerplace/Social/driving/drivingu.cfm   5.  Maintain good sleep hygiene. Avoid alcohol.  6.  Contact your doctor if you have any problems that may be related to the medicine you are taking.  7.  Call 911 and bring the patient back to the ED if:        A.  The seizure lasts longer than 5 minutes.       B.  The patient doesn't awaken shortly after the seizure  C.  The patient has new problems such as difficulty seeing, speaking or moving  D.  The patient was injured during the seizure  E.  The patient has a temperature over 102 F (39C)  F.  The patient vomited and now is having trouble breathing

## 2022-09-10 NOTE — Progress Notes (Signed)
NEUROLOGY CONSULTATION NOTE  Katrina Good MRN: [id number] DOB: Nov 17, 1970  Referring provider: Jacquelin Hawking, PA-C Primary care provider: Jacquelin Hawking, PA-C  Reason for consult:  seizures  Thank you for your kind referral of Katrina Good for consultation of the above symptoms. Although her history is well known to you, please allow me to reiterate it for the purpose of our medical record. The patient was accompanied to the clinic by her fiance Katrina Good who also provides collateral information. Records and images were personally reviewed where available.   HISTORY OF PRESENT ILLNESS: This is a 51 year old right-handed woman with a history of hypertension, presenting to establish care for seizures. She is accompanied by her fiance Katrina Good who helps supplement the history. She was previously seen by neurologist Dr. Marjory Good, records were reviewed. The first seizure occurred in October 2021, she was in the ER for an upper respiratory infection but mentioned episodes of seizure-like actvitiy. She reported she would be walking then suddenly feel pain in her feet, freeze, unable to talk or move. They would last 5-10 minutes then resolve, feeling tired after. She was started on Levetiracetam at that time, with reduction in episodes. Per notes, she was previously having several episodes per day or per week. Brain MRI with and without contrast in 08/2020 was normal. Last visit at [facility] was in 09/2021, Levetiracetam dose increased to 500mg  in AM, 1000mg  in PM. Since then, reports having seizures every 2 weeks. She denies any prior warning symptoms. reports her eyes would get watery, she feels weak and lays down, then has body shaking for 3-4 minutes. No tongue bite or incontinence. She would feel sore and thirsty after, no focal weakness. She would not recall having a seizure and denies she had one. Last seizure was a week ago. Sometimes she notes a bitter taste like sour milk. Sometimes she  has not appetite and everything tastes funny. When the seizures first started, she was having numbness and tingling in her feet and would alert Katrina Good she would fall. He would grab her and then shaking starts. She denies any further numbness/tingling in her feet. Katrina Good notes that seizure triggers are stress and anxiety. Seizures only come on when she gets upset. She broke her toe, got upset, and went into a seizure. She only gets a headache when she gets upset or with loud noises. 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 She reports her PCP has her set up with Psychiatry. Katrina Good has not witnessed any staring/unresponsive episodes. She denies any focal numbness/tingling/weakness, no myoclonic jerks. She usually gets 3-4 hours of sleep and feels tired. She Good on the examination table throughout the duration of the visit today. She had recently broke her right 2nd toe trying to kick a cockroach, hitting the wall. She reports back pain radiating down her right leg and pain in the right foot. Her father had seizures when younger. Otherwise she had a normal birth and early development.  There is no history of febrile convulsions, CNS infections such as meningitis/encephalitis, significant traumatic brain injury, neurosurgical procedures.    PAST MEDICAL HISTORY: Past Medical History:  Diagnosis Date   Acid reflux    Asthma    Bronchitis    Chronic heel pain, left    Diabetes mellitus    Hypertension    Seizures (HCC)    onset was 08/2020   Substance abuse (HCC)    pt denies   Suicidal thoughts  pt denies    PAST SURGICAL HISTORY: Past Surgical History:  Procedure Laterality Date   ABDOMINAL HYSTERECTOMY     partial   CESAREAN SECTION     x 2    MEDICATIONS: Current Outpatient Medications on File Prior to Visit  Medication Sig Dispense Refill   albuterol (VENTOLIN HFA) 108 (90 Base) MCG/ACT inhaler INHALE 2 PUFFS BY MOUTH EVERY 6 HOURS AS NEEDED FOR COUGHING, WHEEZING, OR  SHORTNESS OF BREATH 20.1 g 0   amoxicillin-clavulanate (AUGMENTIN) 875-125 MG tablet Take 1 tablet by mouth 2 (two) times daily.     aspirin 81 MG chewable tablet Chew by mouth daily.     levETIRAcetam (KEPPRA) 1000 MG tablet 500mg  AM and 1000mg  PM 45 tablet 5   losartan (COZAAR) 100 MG tablet Take 1 tablet (100 mg total) by mouth daily. 90 tablet 0   guaiFENesin (ROBITUSSIN) 100 MG/5ML liquid Take 15 mLs by mouth every 6 (six) hours as needed for cough or to loosen phlegm. (Patient not taking: Reported on 09/10/2022) 60 mL 0   No current facility-administered medications on file prior to visit.    ALLERGIES: Allergies  Allergen Reactions   Doxycycline Hives   Strawberry Extract Swelling    Other reaction(s): Facial swelling    FAMILY HISTORY: Family History  Problem Relation Age of Onset   Diabetes Mother    Hypertension Mother    Heart failure Mother    Seizures Father    Cancer Father    Hypertension Father    Heart failure Father    Asthma Father     SOCIAL HISTORY: Social History   Socioeconomic History   Marital status: Single    Spouse name: Not on file   Number of children: 2   Years of education: 11   Highest education level: Not on file  Occupational History    Employer: UNEMPLOYED  Tobacco Use   Smoking status: Every Day    Packs/day: 2.00    Years: 33.00    Total pack years: 66.00    Types: Cigarettes   Smokeless tobacco: Never   Tobacco comments:    2-3 packs  Vaping Use   Vaping Use: Never used  Substance and Sexual Activity   Alcohol use: Not Currently   Drug use: Yes    Types: Marijuana, "Crack" cocaine, Cocaine    Comment: 9/15/20222 used yesterday- crack cocaine; uses crack (but not cocaine).  not MJ user   Sexual activity: Not Currently    Birth control/protection: Surgical  Other Topics Concern   Not on file  Social History Narrative   01/14/21 lives with mother   Social Determinants of Health   Financial Resource Strain: Not on  file  Food Insecurity: Not on file  Transportation Needs: Unmet Transportation Needs (09/29/2020)   PRAPARE - 01/16/21 (Medical): Yes    Lack of Transportation (Non-Medical): Yes  Physical Activity: Not on file  Stress: Not on file  Social Connections: Not on file  Intimate Partner Violence: Not on file     PHYSICAL EXAM: Vitals:   09/10/22 1044  BP: (!) 157/96  Pulse: 87  Resp: 18  SpO2: 97%   General: No acute distress, lying on exam table during the entire visit Head:  Normocephalic/atraumatic Skin/Extremities: No rash, no edema Neurological Exam: Mental status: alert and awake, no dysarthria or aphasia, Fund of knowledge is appropriate.  Attention and concentration are normal.    Cranial nerves: CN I: not tested CN  II: pupils equal, round, visual fields intact CN III, IV, VI:  full range of motion, no nystagmus, no ptosis CN V: facial sensation intact CN VII: upper and lower face symmetric CN VIII: hearing intact to conversation Bulk & Tone: normal, no fasciculations. Motor: 5/5 throughout  with pain on right leg. No pronator drift. Sensation: intact to light touch, cold, pin, vibration sense.  No extinction to double simultaneous stimulation.  Deep Tendon Reflexes: +2 throughout Cerebellar: no incoordination on finger to nose testing Gait: slow and cautious due to back/right leg pain, no ataxia Tremor: none   IMPRESSION: This is a 51 year old right-handed woman with a history of hypertension, presenting to establish care for seizures. 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 They do however note that there had been a reduction in seizure frequency with initiation of Levetiracetam, last seizure was a week ago. We discussed different types of seizures, including PNES. EEG will be ordered to help classify seizures, she may benefit from vEEG in  the future once insurance allows. For now, continue Levetiracetam 1000mg  1/2 tab in AM, 1 tab in PM. They were advised to keep a calendar of her seizures, including triggers, as well as try to take a video of the events. Agree with Behavioral Health follow-up. [place] driving laws were discussed with the patient, and she knows to stop driving after a seizure, until 6 months seizure-free. Follow-up in 3 months, call for any changes.    Thank you for allowing me to participate in the care of this patient. Please do not hesitate to call for any questions or concerns.   , M.D.  CC: Patrcia Dolly, PA-C

## 2022-09-16 ENCOUNTER — Ambulatory Visit: Payer: Self-pay | Admitting: Orthopaedic Surgery

## 2022-09-21 ENCOUNTER — Encounter: Payer: Self-pay | Admitting: Physician Assistant

## 2022-09-29 ENCOUNTER — Other Ambulatory Visit: Payer: Self-pay

## 2022-09-29 ENCOUNTER — Encounter: Payer: Self-pay | Admitting: Neurology

## 2022-09-29 DIAGNOSIS — Z029 Encounter for administrative examinations, unspecified: Secondary | ICD-10-CM

## 2022-09-30 ENCOUNTER — Ambulatory Visit: Payer: Self-pay | Admitting: Physician Assistant

## 2022-10-22 ENCOUNTER — Ambulatory Visit (INDEPENDENT_AMBULATORY_CARE_PROVIDER_SITE_OTHER): Payer: Self-pay | Admitting: Neurology

## 2022-10-22 DIAGNOSIS — R569 Unspecified convulsions: Secondary | ICD-10-CM

## 2022-10-22 NOTE — Progress Notes (Signed)
EEG complete - results pending 

## 2022-10-28 ENCOUNTER — Ambulatory Visit: Payer: Self-pay | Admitting: Physician Assistant

## 2022-11-03 NOTE — Procedures (Signed)
ELECTROENCEPHALOGRAM REPORT  Date of Study: 10/22/2022  Patient's Name: Katrina Good MRN: [id number] Date of Birth: [birth year]  Referring Provider: Dr. Ellouise Newer  Clinical History: This is a 52 year old woman with recurrent shaking episodes. EEG for classification.  Medications: Levetiracetam, Cozaar, aspirin  Technical Summary: A multichannel digital EEG recording measured by the international 10-20 system with electrodes applied with paste and impedances below 5000 ohms performed in our laboratory with EKG monitoring in an awake and asleep patient.  Hyperventilation and photic stimulation were performed.  The digital EEG was referentially recorded, reformatted, and digitally filtered in a variety of bipolar and referential montages for optimal display.    Description: The patient is awake and asleep during the recording.  During maximal wakefulness, there is a symmetric, medium voltage 10 Hz posterior dominant rhythm that attenuates with eye opening.  The record is symmetric.  During drowsiness and sleep, there is an increase in theta slowing of the background.  Vertex waves and symmetric sleep spindles were seen. Hyperventilation and photic stimulation did not elicit any abnormalities.  There were no epileptiform discharges or electrographic seizures seen.    EKG lead showed rare extrasystolic beats.  Impression: This awake and asleep EEG is normal.    Clinical Correlation: A normal EEG does not exclude a clinical diagnosis of epilepsy.  If further clinical questions remain, prolonged EEG may be helpful.  Clinical correlation is advised.   Ellouise Newer, M.D.

## 2022-11-05 ENCOUNTER — Encounter: Payer: Self-pay | Admitting: Neurology

## 2022-11-15 ENCOUNTER — Encounter: Payer: Self-pay | Admitting: Neurology

## 2022-12-07 ENCOUNTER — Ambulatory Visit: Payer: Self-pay | Admitting: Neurology

## 2022-12-17 ENCOUNTER — Ambulatory Visit: Payer: Self-pay | Admitting: Neurology

## 2023-01-31 ENCOUNTER — Other Ambulatory Visit: Payer: Self-pay | Admitting: Physician Assistant

## 2023-03-01 ENCOUNTER — Ambulatory Visit: Payer: Self-pay | Admitting: Physician Assistant

## 2023-03-03 ENCOUNTER — Ambulatory Visit: Payer: Self-pay | Admitting: Physician Assistant

## 2023-04-28 ENCOUNTER — Encounter: Payer: Self-pay | Admitting: Neurology

## 2023-04-28 ENCOUNTER — Ambulatory Visit: Payer: Self-pay | Admitting: Neurology

## 2023-04-28 DIAGNOSIS — Z029 Encounter for administrative examinations, unspecified: Secondary | ICD-10-CM

## 2023-07-13 ENCOUNTER — Encounter: Payer: Self-pay | Admitting: Physician Assistant

## 2023-07-13 ENCOUNTER — Ambulatory Visit: Payer: Self-pay | Admitting: Physician Assistant

## 2023-07-13 ENCOUNTER — Other Ambulatory Visit: Payer: Self-pay | Admitting: Physician Assistant

## 2023-07-13 VITALS — BP 122/77 | HR 85 | Temp 97.3°F | Ht 69.0 in | Wt 193.2 lb

## 2023-07-13 DIAGNOSIS — F17200 Nicotine dependence, unspecified, uncomplicated: Secondary | ICD-10-CM

## 2023-07-13 DIAGNOSIS — G40909 Epilepsy, unspecified, not intractable, without status epilepticus: Secondary | ICD-10-CM

## 2023-07-13 DIAGNOSIS — R7303 Prediabetes: Secondary | ICD-10-CM

## 2023-07-13 DIAGNOSIS — Z1211 Encounter for screening for malignant neoplasm of colon: Secondary | ICD-10-CM

## 2023-07-13 DIAGNOSIS — Z13220 Encounter for screening for lipoid disorders: Secondary | ICD-10-CM

## 2023-07-13 DIAGNOSIS — Z1239 Encounter for other screening for malignant neoplasm of breast: Secondary | ICD-10-CM

## 2023-07-13 DIAGNOSIS — I10 Essential (primary) hypertension: Secondary | ICD-10-CM

## 2023-07-13 MED ORDER — LEVETIRACETAM 1000 MG PO TABS: ORAL_TABLET | ORAL | 0 refills | Status: DC

## 2023-07-13 MED ORDER — LOSARTAN POTASSIUM 100 MG PO TABS: 100.0000 mg | ORAL_TABLET | Freq: Every day | ORAL | 0 refills | Status: AC

## 2023-07-13 MED ORDER — ALBUTEROL SULFATE HFA 108 (90 BASE) MCG/ACT IN AERS: INHALATION_SPRAY | RESPIRATORY_TRACT | 0 refills | Status: AC

## 2023-07-13 NOTE — Patient Instructions (Addendum)
-  Call neurologist to get appointment  -Update labs/bloodwork (fasting) -Update enrollment including [employer] and [employer] application -Get your mother a doctor

## 2023-07-13 NOTE — Progress Notes (Signed)
BP 122/77   Pulse 85   Temp (!) 97.3 F (36.3 C)   Ht 5\' 9"  (1.753 m)   Wt 193 lb 4 oz (87.7 kg)   SpO2 97%   BMI 28.54 kg/m    Subjective:    Patient ID: Katrina Good, female    DOB: 1971-03-09, 52 y.o.   MRN: [id number]  HPI: Katrina Good is a 52 y.o. female presenting on 07/13/2023 for Follow-up (Pt has been "locked up" and hasn't been able to get her medicines. Pt was incarcerated about 8 months and was released on 06/30/23.)   HPI Chief Complaint  Patient presents with   Follow-up    Pt has been "locked up" and hasn't been able to get her medicines. Pt was incarcerated about 8 months and was released on 06/30/23.    Pt was Last seen here 09/01/2022  Pt says she had a seizure yesterday.  She has been out of her meds since end of August when she got out of jail  She says she is still having right leg weakness.  She says it started when she was in jail.  She says she didn't tell anyone about it at the time.  She C/o HA frequently.   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     Relevant past medical, surgical, family and social history reviewed and updated as indicated. Interim medical history since our last visit reviewed. Allergies and medications reviewed and updated.  CURREN TMEDS: aspirin  Review of Systems  Per HPI unless specifically indicated above     Objective:    BP 122/77   Pulse 85   Temp (!) 97.3 F (36.3 C)   Ht 5\' 9"  (1.753 m)   Wt 193 lb 4 oz (87.7 kg)   SpO2 97%   BMI 28.54 kg/m   Wt Readings from Last 3 Encounters:  07/13/23 193 lb 4 oz (87.7 kg)  09/10/22 202 lb (91.6 kg)  08/24/22 202 lb 8 oz (91.9 kg)    Physical Exam Vitals reviewed.  Constitutional:      General: She is not in acute distress.    Appearance: She is well-developed. She is not toxic-appearing.  HENT:     Head: Normocephalic and atraumatic.  Cardiovascular:     Rate and Rhythm: Normal rate and regular rhythm.  Pulmonary:     Effort: Pulmonary  effort is normal.     Breath sounds: Normal breath sounds.  Abdominal:     General: Bowel sounds are normal.     Palpations: Abdomen is soft. There is no mass.     Tenderness: There is no abdominal tenderness.  Musculoskeletal:     Cervical back: Neck supple.     Right hip: No tenderness. Normal range of motion.     Right knee: Normal range of motion.     Right lower leg: No swelling. No edema.     Left lower leg: No swelling. No edema.     Right ankle: No swelling. Normal range of motion.     Left ankle: Normal range of motion.  Lymphadenopathy:     Cervical: No cervical adenopathy.  Skin:    General: Skin is warm and dry.  Neurological:     Mental Status: She is alert and oriented to person, place, and time.     Motor: No weakness or tremor.     Comments: LE without appreciable weakness  Psychiatric:  Behavior: Behavior normal.           Assessment & Plan:   Encounter Diagnoses  Name Primary?   Primary hypertension Yes   Prediabetes    Tobacco use disorder    Colon cancer screening    Screening cholesterol level    Encounter for screening for malignant neoplasm of breast, unspecified screening modality    Seizure disorder (HCC)      Seizure d/o -Pt to Call her neurologist for appt about her seizures.  She is given rx keppra but discussed that she will need to get future rx from her neurologist who monitors her condition  uninsured -pt to Update enrollment including medassist -pt is given [employer] application to update assistance with [employer]  Caregiver burden -pt is Urged her to get her mother in to see a doctor.  She was given infomation on [facility] which can help with resources but emphasized that her mother must first have diagnosis of dementia  HCM -referred for screening Mammogram -refer for CT chest -screening lung cancer- schedule out > 3-4 wk so pt can be given appointment information at her next OV (she doesn't have a phone) -will Update  labs -pt is given FIT test for colon cancer screening -pt is given refills on meds- losartan, albuterol, keppra  Pt to follow up 3-4 weeks.  She is to contact office sooner prn

## 2023-07-15 ENCOUNTER — Other Ambulatory Visit (HOSPITAL_COMMUNITY)
Admission: RE | Admit: 2023-07-15 | Discharge: 2023-07-15 | Disposition: A | Discharge status: Home / Self Care | Payer: 59 | Source: Ambulatory Visit | Attending: Physician Assistant | Admitting: Physician Assistant

## 2023-07-15 DIAGNOSIS — I10 Essential (primary) hypertension: Secondary | ICD-10-CM | POA: Insufficient documentation

## 2023-07-15 DIAGNOSIS — R7303 Prediabetes: Secondary | ICD-10-CM | POA: Insufficient documentation

## 2023-07-15 DIAGNOSIS — Z13220 Encounter for screening for lipoid disorders: Secondary | ICD-10-CM | POA: Diagnosis not present

## 2023-07-15 LAB — COMPREHENSIVE METABOLIC PANEL
ALT: 17 U/L (ref 0–44)
AST: 13 U/L — ABNORMAL LOW (ref 15–41)
Albumin: 3.6 g/dL (ref 3.5–5.0)
Alkaline Phosphatase: 111 U/L (ref 38–126)
Anion gap: 8 (ref 5–15)
BUN: 14 mg/dL (ref 6–20)
CO2: 26 mmol/L (ref 22–32)
Calcium: 8.7 mg/dL — ABNORMAL LOW (ref 8.9–10.3)
Chloride: 104 mmol/L (ref 98–111)
Creatinine, Ser: 0.8 mg/dL (ref 0.44–1.00)
GFR, Estimated: >60 mL/min (ref >60)
Glucose, Bld: 102 mg/dL — ABNORMAL HIGH (ref 70–99)
Potassium: 4 mmol/L (ref 3.5–5.1)
Sodium: 138 mmol/L (ref 135–145)
Total Bilirubin: 0.3 mg/dL (ref 0.3–1.2)
Total Protein: 7.4 g/dL (ref 6.5–8.1)

## 2023-07-15 LAB — LIPID PANEL
Cholesterol: 151 mg/dL (ref 0–200)
HDL: 38 mg/dL — ABNORMAL LOW (ref >40)
LDL Cholesterol: 100 mg/dL — ABNORMAL HIGH (ref 0–99)
Total CHOL/HDL Ratio: 4 ratio
Triglycerides: 63 mg/dL (ref <150)
VLDL: 13 mg/dL (ref 0–40)

## 2023-07-16 LAB — HEMOGLOBIN A1C
Hgb A1c MFr Bld: 5.9 % — ABNORMAL HIGH (ref 4.8–5.6)
Mean Plasma Glucose: 123 mg/dL

## 2023-07-31 ENCOUNTER — Encounter (HOSPITAL_COMMUNITY): Payer: Self-pay

## 2023-07-31 ENCOUNTER — Other Ambulatory Visit: Payer: Self-pay

## 2023-07-31 ENCOUNTER — Emergency Department (HOSPITAL_COMMUNITY)
Admission: EM | Admit: 2023-07-31 | Discharge: 2023-08-01 | Discharge status: Against Medical Advice | Payer: 59 | Attending: Emergency Medicine | Admitting: Emergency Medicine

## 2023-07-31 DIAGNOSIS — J029 Acute pharyngitis, unspecified: Secondary | ICD-10-CM | POA: Insufficient documentation

## 2023-07-31 DIAGNOSIS — M79672 Pain in left foot: Secondary | ICD-10-CM | POA: Insufficient documentation

## 2023-07-31 DIAGNOSIS — R070 Pain in throat: Secondary | ICD-10-CM | POA: Diagnosis not present

## 2023-07-31 DIAGNOSIS — Z5321 Procedure and treatment not carried out due to patient leaving prior to being seen by health care provider: Secondary | ICD-10-CM | POA: Diagnosis not present

## 2023-07-31 NOTE — ED Triage Notes (Signed)
Upon assessment in triage, pt does have pinpoint mark on ball of left foot, skin intact, no bleeding, area is callused.

## 2023-07-31 NOTE — ED Triage Notes (Signed)
Pt brought in by EMS after she called them stating she stepped on a nail in the [facility] parking lot around 3:30 today. States nail went throught her flip flop. EMS reports visualization of puncture wound and that area is bandaged. Rates pain as 10/10 and states last Tetanus shot 5 yrs ago. Pt also c/o sore throat.

## 2023-08-01 ENCOUNTER — Telehealth: Payer: Self-pay

## 2023-08-01 NOTE — Telephone Encounter (Signed)
Made first attempt to f/u with pt by phone and text message but pt was unavailable at the time of the call.     This call was in attempt to provide medical case management to ensure pt did not have any additional questions or concerns regarding any post hospital instruction,medical concerns, obtaining her medications and serving as a reminder of any upcoming appointments.    Will attempt to make additional phone contact, if pt does attempt  to return call first 

## 2023-08-02 ENCOUNTER — Telehealth: Payer: Self-pay

## 2023-08-02 NOTE — Telephone Encounter (Signed)
Made 2nd attempt to hospital f/u with pt by phone and text message but pt was unavailable at the time of the call.     This call was in attempt to provide medical case management to ensure pt did not have any additional questions or concerns regarding any post hospital instruction,medical concerns, obtaining her medications and serving as a reminder of any upcoming appointments.

## 2023-08-03 ENCOUNTER — Ambulatory Visit: Payer: Self-pay | Admitting: Physician Assistant

## 2023-08-03 ENCOUNTER — Encounter: Payer: Self-pay | Admitting: Physician Assistant

## 2023-08-03 VITALS — BP 123/79 | HR 97 | Temp 96.7°F | Ht 69.0 in | Wt 193.0 lb

## 2023-08-03 DIAGNOSIS — F17200 Nicotine dependence, unspecified, uncomplicated: Secondary | ICD-10-CM

## 2023-08-03 DIAGNOSIS — Z748 Other problems related to care provider dependency: Secondary | ICD-10-CM

## 2023-08-03 DIAGNOSIS — L089 Local infection of the skin and subcutaneous tissue, unspecified: Secondary | ICD-10-CM

## 2023-08-03 DIAGNOSIS — Z9189 Other specified personal risk factors, not elsewhere classified: Secondary | ICD-10-CM

## 2023-08-03 DIAGNOSIS — Z5900 Homelessness unspecified: Secondary | ICD-10-CM

## 2023-08-03 DIAGNOSIS — Z5971 Insufficient health insurance coverage: Secondary | ICD-10-CM

## 2023-08-03 DIAGNOSIS — G40909 Epilepsy, unspecified, not intractable, without status epilepticus: Secondary | ICD-10-CM

## 2023-08-03 DIAGNOSIS — I10 Essential (primary) hypertension: Secondary | ICD-10-CM

## 2023-08-03 DIAGNOSIS — Z636 Dependent relative needing care at home: Secondary | ICD-10-CM

## 2023-08-03 MED ORDER — CIPROFLOXACIN HCL 500 MG PO TABS: 500.0000 mg | ORAL_TABLET | Freq: Two times a day (BID) | ORAL | 0 refills | Status: AC

## 2023-08-03 MED ORDER — CIPROFLOXACIN HCL 500 MG PO TABS: 500.0000 mg | ORAL_TABLET | Freq: Two times a day (BID) | ORAL | 0 refills | Status: DC

## 2023-08-03 NOTE — Patient Instructions (Signed)
Puncture Wound A puncture wound is an injury that is caused by a sharp, thin object that penetrates your skin. Usually, a puncture wound does not leave a large opening in your skin, so it may not bleed a lot. However, when you get a puncture wound, dirt or other materials (foreign bodies) can be forced into your wound and can break off inside. This increases the chance of infection, such as tetanus. There are many sharp, pointed objects that can cause puncture wounds, including teeth, nails, splinters of glass, fishhooks, and needles. Treatment may include washing out the wound with a germ-free (sterile) salt-water solution and having the wound opened surgically to remove a foreign object. The wound may be closed with stitches (sutures), skin glue, or adhesive strips and covered with antibiotic ointment and a bandage (dressing). Depending on what caused the injury, you may also need a tetanus shot or a rabies shot. Follow these instructions at home: Medicines Take or apply over-the-counter and prescription medicines only as told by your health care provider. If you were prescribed antibiotics, take or apply them as told by your health care provider. Do not stop using the antibiotic even if you start to feel better. Bathing Keep the dressing clean and dry as told by your health care provider. Do not take baths, swim, or use a hot tub until your health care provider approves. Ask your health care provider if you may take showers. You may only be allowed to take sponge baths. Wound care  Follow instructions from your health care provider about how to take care of your wound. Make sure you: Wash your hands with soap and water for at least 20 seconds before and after you change your dressing. If soap and water are not available, use hand sanitizer. Change your dressing as told by your health care provider. Leave sutures, skin glue, or adhesive strips in place. These skin closures may need to stay in place  for 2 weeks or longer. If adhesive strip edges start to loosen and curl up, you may trim the loose edges. Do not remove adhesive strips completely unless your health care provider tells you to do that. Clean the wound as told by your health care provider. Do not scratch or pick at the wound. Check your wound every day for signs of infection. Check for: Redness, swelling, or pain. Fluid or blood. Warmth. Pus or a bad smell. General instructions Raise (elevate) the injured area above the level of your heart while you are sitting or lying down. If your puncture wound is in your foot, ask your health care provider if you need to avoid putting weight on your foot and for how long. Do not use the injured limb to support your body weight until your health care provider says that you can. Use crutches as told by your health care provider. Keep all follow-up visits. This is important. Contact a health care provider if: You received a tetanus or rabies shot and you have swelling, severe pain, redness, or bleeding at the injection site. You have any of these signs of wound infection: Redness, swelling, or pain. Fluid or blood. Warmth. Pus or a bad smell. Your sutures come out. You notice something coming out of your wound, such as wood or glass. Your pain is not controlled with medicine. You develop numbness around your wound. Get help right away if: You develop severe swelling around your wound. You have a red streak going away from your wound. You develop painful skin  lumps. The wound is on your hand or foot and you: Cannot properly move a finger or toe. Notice that your fingers or toes look pale or bluish. Summary A puncture wound is an injury that is caused by a sharp, thin object that penetrates your skin. Treatment may include washing out the wound and having the wound opened surgically to remove a foreign object, The wound may be closed with stitches (sutures), skin glue, or adhesive  strips and covered with antibiotic ointment and a bandage (dressing). Follow instructions from your health care provider about how to take care of your wound. Contact a health care provider if you have redness, swelling, or pain at the site of your wound. This information is not intended to replace advice given to you by your health care provider. Make sure you discuss any questions you have with your health care provider. Document Revised: 11/18/2021 Document Reviewed: 11/18/2021 Elsevier Patient Education  2024 [employer].

## 2023-08-03 NOTE — Progress Notes (Signed)
BP 123/79   Pulse 97   Temp (!) 96.7 F (35.9 C)   Ht 5\' 9"  (1.753 m)   Wt 193 lb (87.5 kg)   SpO2 97%   BMI 28.50 kg/m    Subjective:    Patient ID: Katrina Good, female    DOB: 1971-05-20, 52 y.o.   MRN: [id number]  HPI: Katrina Good is a 52 y.o. female presenting on 08/03/2023 for Follow-up (Pt states she is in need of her inhaler. Pt states she tried to get her inhaler from the pharmacy and it was $45 which she was unable to afford. Pt states she has been having some SOB.) and Foot Injury (Pt states she stepped on a nail at [facility] 2 days ago. Pt went to ER and they wrapped it and instructed her to keep it clean. Pt states she has pain and noticed it has increased redness and increased swelling.Marland Kitchen)   HPI   Chief Complaint  Patient presents with   Follow-up    Pt states she is in need of her inhaler. Pt states she tried to get her inhaler from the pharmacy and it was $45 which she was unable to afford. Pt states she has been having some SOB. Pt is requesting another FIT test since she is unable to find the bottle.   Foot Injury    Pt states she stepped on a nail at [facility] 2 days ago. Pt went to ER and they wrapped it and instructed her to keep it clean. Pt states she has pain and noticed it has increased redness and increased swelling..   Foot- Epic reviewed- Pt left ER prior to being seen Tetanus done June 2019  Pt says she is very stressed out now with caring for her mother who has dementia, having no telephone, having no transportation.  Also she says she got evicted today and all their belongings are sitting on the side of the road where they were living.   Pt seen 07/13/23- -Pt was recommended to Call her neurologist for appt about her seizures.  She has not yet done this  -pt was encouraged to Update enrollment including medassist.  She has not yet done this -pt was given [facility] application to update assistance with cone.  She has not yet done this  She did  get her labs updated  Pt says she is smoking > 1ppd.  She says she hasn't been able to pick up her inhaler from pharmacy yet because she didn't have the money.  Rx was not sent to [facility] where she could get it for free because enrollment expired.    Relevant past medical, surgical, family and social history reviewed and updated as indicated. Interim medical history since our last visit reviewed. Allergies and medications reviewed and updated.    Current Outpatient Medications:    aspirin 81 MG chewable tablet, Chew by mouth daily., Disp: , Rfl:    levETIRAcetam (KEPPRA) 1000 MG tablet, Take 1/2 tablet every morning, 1 tablet every night, Disp: 45 tablet, Rfl: 0   losartan (COZAAR) 100 MG tablet, Take 1 tablet (100 mg total) by mouth daily., Disp: 30 tablet, Rfl: 0   albuterol (VENTOLIN HFA) 108 (90 Base) MCG/ACT inhaler, INHALE 2 PUFFS BY MOUTH EVERY 6 HOURS AS NEEDED FOR COUGHING, WHEEZING, OR SHORTNESS OF BREATH (Patient not taking: Reported on 08/03/2023), Disp: 20.1 g, Rfl: 0   guaiFENesin (ROBITUSSIN) 100 MG/5ML liquid, Take 15 mLs by mouth every 6 (six) hours as needed  for cough or to loosen phlegm. (Patient not taking: Reported on 09/10/2022), Disp: 60 mL, Rfl: 0    Review of Systems  Per HPI unless specifically indicated above     Objective:    BP 123/79   Pulse 97   Temp (!) 96.7 F (35.9 C)   Ht 5\' 9"  (1.753 m)   Wt 193 lb (87.5 kg)   SpO2 97%   BMI 28.50 kg/m   Wt Readings from Last 3 Encounters:  08/03/23 193 lb (87.5 kg)  07/31/23 193 lb (87.5 kg)  07/13/23 193 lb 4 oz (87.7 kg)    Physical Exam Constitutional:      General: She is not in acute distress.    Appearance: She is not toxic-appearing.  HENT:     Head: Normocephalic and atraumatic.  Cardiovascular:     Rate and Rhythm: Normal rate and regular rhythm.     Pulses:          Dorsalis pedis pulses are 2+ on the left side.  Pulmonary:     Effort: Pulmonary effort is normal. No respiratory  distress.     Breath sounds: Normal breath sounds. No wheezing or rhonchi.  Abdominal:     General: Abdomen is flat. Bowel sounds are normal.     Palpations: Abdomen is soft.     Tenderness: There is no abdominal tenderness.  Musculoskeletal:     Right lower leg: No edema.     Left lower leg: No edema.       Feet:  Feet:     Left foot:     Skin integrity: Warmth and callus present. No skin breakdown or erythema.     Comments: Small area appears to be puncture site plantar surface left foot as pictured.  The area is tender and swelled.  There is no obvious erythema.  There is some callus formation including on the medial aspect of the foot.  There is no drainage.   Neurological:     Mental Status: She is alert and oriented to person, place, and time.  Psychiatric:        Mood and Affect: Affect is tearful.        Speech: Speech normal.        Behavior: Behavior is cooperative.     Results for orders placed or performed during the hospital encounter of 07/15/23  Hemoglobin A1c  Result Value Ref Range   Hgb A1c MFr Bld 5.9 (H) 4.8 - 5.6 %   Mean Plasma Glucose 123 mg/dL  Comprehensive metabolic panel  Result Value Ref Range   Sodium 138 135 - 145 mmol/L   Potassium 4.0 3.5 - 5.1 mmol/L   Chloride 104 98 - 111 mmol/L   CO2 26 22 - 32 mmol/L   Glucose, Bld 102 (H) 70 - 99 mg/dL   BUN 14 6 - 20 mg/dL   Creatinine, Ser 6.29 0.44 - 1.00 mg/dL   Calcium 8.7 (L) 8.9 - 10.3 mg/dL   Total Protein 7.4 6.5 - 8.1 g/dL   Albumin 3.6 3.5 - 5.0 g/dL   AST 13 (L) 15 - 41 U/L   ALT 17 0 - 44 U/L   Alkaline Phosphatase 111 38 - 126 U/L   Total Bilirubin 0.3 0.3 - 1.2 mg/dL   GFR, Estimated >52 >84 mL/min   Anion gap 8 5 - 15  Lipid panel  Result Value Ref Range   Cholesterol 151 0 - 200 mg/dL   Triglycerides 63 <132 mg/dL  HDL 38 (L) >40 mg/dL   Total CHOL/HDL Ratio 4.0 RATIO   VLDL 13 0 - 40 mg/dL   LDL Cholesterol 244 (H) 0 - 99 mg/dL      Assessment & Plan:   Encounter  Diagnoses  Name Primary?   Puncture wound of plantar aspect of left foot with infection, initial encounter Yes   Primary hypertension    Tobacco use disorder    Seizure disorder (HCC)    Lack of telephone at home    Dependent for transportation    Homelessness    Caregiver stress    Uninsured      HTN -reviewed labs with pt -pt to continue current medications  Plantar PW -rx cipro for likely infected plantar PW.  Ordered xray to check for FB.  Unable to consider exploration due to pt pain level and current state of mind and social circumstances.  Will recheck one week.  Pt is urged to RTO right away for any worsening.   Seizure d/o -on keppra -pt reminded she needs neurology to monitor this  Social situation -contacted care connect who are coming over to assist pt

## 2023-08-03 NOTE — Congregational Nurse Program (Signed)
Dept: [phone]   Congregational Nurse Program Note  Date of Encounter: 08/03/2023  Past Medical History: Past Medical History:  Diagnosis Date   Acid reflux    Asthma    Bronchitis    Chronic heel pain, left    Diabetes mellitus    Hypertension    Seizures (HCC)    onset was 08/2020   Substance abuse (HCC)    pt denies   Suicidal thoughts    pt denies    Encounter Details:  CNP Questionnaire - 08/03/23 1632       Questionnaire   Ask client: Do you give verbal consent for me to treat you today? N/A    Student Assistance N/A    Location Patient Served  [facility]    Visit Setting with Client MD Office    Patient Status Unhoused    Insurance Uninsured ([place] Card/Care Connects/Self-Pay/Medicaid Family Planning)    Insurance/Financial Assistance Referral N/A    Medication Have Medication Insecurities;Provided Medication Assistance    Medical Provider Yes    Screening Referrals Made N/A    Medical Referrals Made N/A    Medical Appointment Made N/A    Recently w/o PCP, now 1st time PCP visit completed due to CNs referral or appointment made N/A    Food Have Food Insecurities;Referred to Food Pantry    Good Need Good assistance;Provided Good assistance    Housing/Utilities No permanent housing;Referred to housing/utility assistance program    Interpersonal Safety N/A    Interventions Advocate/Support;Case Management    Abnormal to Normal Screening Since Last CN Visit N/A    Screenings CN Performed N/A    Sent Client to Lab for: N/A    Did client attend any of the following based off CNs referral or appointments made? N/A    ED Visit Averted N/A    Life-Saving Intervention Made N/A            Ms. Katrina Good had an appointment today at [facility] at 10:00 am. After visit [facility] staff called [facility] for assistance. 1). Katrina Good is uninsured and her Care Good has expired, she needs to renew, apply  for Medicaid and MedAssist. 2) Katrina Good and he Katrina Good [facility] were asked to leave this morning where they had been living with someone who was evicted per Katrina Good at [facility]. They are currently homeless and have no family to stay with and Katrina Good that her Katrina Good has dementia and she is stressed providing care and safety for her Katrina Good.  3) Good: they do not have Good and have been walking. Will need Good to medical appointments. 4). Food insecurity. Neither Katrina Good nor her Katrina Good have current food stamps, they eat lunch at [employer] and that has become a necessary routine of the Katrina Good. 5) Care Good renewal needed, medassist and Medicaid application needs to be completed. 6) medication assistance: Katrina Good had a recent ER visit with a puncture wound to her left food and was seen today by provider at [facility] and prescribed Cipro which client cannot afford.  Plans: Katrina Good contacted Katrina Good of integrated Health with assistance for housing, the Katrina Good having dementia, Katrina Good has SS and states it is 1000/month per Katrina Good.  Katrina Good has no income. Katrina Good will assess for housing, food stamps, Medicaid for [facility] Good and renew [employer] and other possible services needed.  Today's housing concern: [facility] was able to assist with one night's lodging at [facility] for  70$ per night and assisted to transport client and Katrina Good to the hotel.  Submitted application to The [place] [facility] for possible assistance to extend assistance for a full week at the hotel while housing alternatives are explored. Application emailed.  Good for 08/04/23 arranged with Katrina Good for pick up at 1030 am at [facility] [place] for a one way trip to [facility]. Katrina Good and client Katrina Good will go eat lunch at the soup kitchen and the come back to the free Clinic to meet with Katrina Good SW for integrated care. He will  transport back to [facility] once completed.  Food for today: [facility] provided lunch and meal for dinner tonight. [employer] provided water and cereal for snack and the morning.   Follow up appointment in one week to [facility]: Care Good will assist to transport client if no other Good available.   Medication: Care Good provided assistance with cipro today and medication delivered to client today at the free Clinic.  Total time spent with arrangements and coordination:3 hours.  UPDATE: Approval from [facility] for extending hotel stay assistance for a full week has been approved and Shann Medal RN from [facility] [facility] community nursing will come 08/04/23 to pay for that lodging. Client notified.  They are currently in Room 27 at [facility] in [place].   Francee Nodal RN [employer]

## 2023-08-04 NOTE — Congregational Nurse Program (Addendum)
Went to meet client at The [facility] this afternoon at 1:30 pm.  She and her mother had an appointment to meet with J.Yow SW for Integrated care after meal at The [place] soup kitchen, which is two blocks away.  She nor her mother made it to her appointment. Did drive through the neighborhoods and surrounding areas and did not see client or her mother.  Also attempted to contact her at her room at [facility], no answer.  Mr. Thurnell Lose states he will attempt to connect with her at the [facility] later today. Otherwise client nor her mother have a cell phone. Room number provided to Mr. Fulton Reek SW.  [facility] Clinic is closed on Fridays, will plan to continue to attempt to reach out to client.  UPDATE: 1600 Mr. Ether Griffins SW Integrated health care notified this RN that he tried going by to the client's room at [facility] and no one came to the door.  I had multiple attempts calling room at [facility] this afternoon with no one answering.  Plan: will continue to attempt to reach. Free Clinic is closed on Fridays, client and mother have the room reserved currently until 08/10/23 check out at 11 am. Assistance for room provided by [facility] patient assistance fund/[facility] [employer] .  Francee Nodal RN [employer]

## 2023-08-05 ENCOUNTER — Telehealth: Payer: Self-pay

## 2023-08-05 NOTE — Telephone Encounter (Signed)
Attempted follow up call to Ms Katrina Good at [facility] room 27 no answer.   Francee Nodal RN [employer]

## 2023-08-08 ENCOUNTER — Encounter: Payer: Self-pay | Admitting: Physician Assistant

## 2023-08-08 ENCOUNTER — Telehealth: Payer: Self-pay

## 2023-08-08 ENCOUNTER — Ambulatory Visit (HOSPITAL_COMMUNITY)
Admission: RE | Admit: 2023-08-08 | Discharge: 2023-08-08 | Disposition: A | Discharge status: Home / Self Care | Payer: 59 | Source: Ambulatory Visit | Attending: Physician Assistant | Admitting: Physician Assistant

## 2023-08-08 ENCOUNTER — Ambulatory Visit: Payer: Self-pay | Admitting: Physician Assistant

## 2023-08-08 VITALS — BP 138/85 | HR 80 | Temp 97.4°F

## 2023-08-08 DIAGNOSIS — S91332D Puncture wound without foreign body, left foot, subsequent encounter: Secondary | ICD-10-CM | POA: Diagnosis present

## 2023-08-08 DIAGNOSIS — L03116 Cellulitis of left lower limb: Secondary | ICD-10-CM

## 2023-08-08 DIAGNOSIS — Z9189 Other specified personal risk factors, not elsewhere classified: Secondary | ICD-10-CM

## 2023-08-08 DIAGNOSIS — L089 Local infection of the skin and subcutaneous tissue, unspecified: Secondary | ICD-10-CM | POA: Diagnosis present

## 2023-08-08 DIAGNOSIS — Z5900 Homelessness unspecified: Secondary | ICD-10-CM

## 2023-08-08 DIAGNOSIS — F810 Specific reading disorder: Secondary | ICD-10-CM

## 2023-08-08 MED ORDER — CEFTRIAXONE SODIUM 1 G IJ SOLR
1.0000 g | Freq: Once | INTRAMUSCULAR | Status: AC
  Administered 2023-08-08: 1 g via INTRAMUSCULAR

## 2023-08-08 MED ORDER — IOHEXOL 300 MG/ML  SOLN
75.0000 mL | Freq: Once | INTRAMUSCULAR | Status: AC | PRN
  Administered 2023-08-08: 75 mL via INTRAVENOUS

## 2023-08-08 NOTE — Telephone Encounter (Signed)
Attempted to call for follow up from [facility] program to [employer] [facility] and no answer and unable to leave message due to hotel phone.  Updated: Free clinic states someone brought client by this morning and provider did follow up regarding her foot which is not any better, she was told to go for Stat CT scan today. Doristine Mango states that client states she missed appointment last Thursday at 130 to meet with Fulton Reek SW with integrated for housing , and other applications due to a family member picked them up at [employer] and took them for food and clothing. Doristine Mango from [facility] states she gave client Katrina Good's business card and asked her to give him a call, that he would meet with them at the [facility]. Client has to check out by Wednesday 109/24 at 11am.  This RN went to [facility] room 27 to attempt in person follow up and no one came to the door. Attempted also to call for update with Mr. Ether Griffins SW integrated health and left message due to no answer.   Francee Nodal RN [employer]

## 2023-08-08 NOTE — Progress Notes (Signed)
BP 138/85   Pulse 80   Temp (!) 97.4 F (36.3 C)   SpO2 97%    Subjective:    Patient ID: Katrina Good, female    DOB: 1971-06-10, 52 y.o.   MRN: [id number]  HPI: Katrina Good is a 52 y.o. female presenting on 08/08/2023 for No chief complaint on file.   HPI  Pt was Seen 08/03/23 with infected plantar puncture wound.  She Had appt to follow up 10/9 but stopped by office today c/o it was worse. She denies fevers.  She says she started the cipro same day she was seen.  She has not gotten ordered foot xray done yet.   Relevant past medical, surgical, family and social history reviewed and updated as indicated. Interim medical history since our last visit reviewed. Allergies and medications reviewed and updated.  Review of Systems  Per HPI unless specifically indicated above     Objective:    BP 138/85   Pulse 80   Temp (!) 97.4 F (36.3 C)   SpO2 97%   Wt Readings from Last 3 Encounters:  08/03/23 193 lb (87.5 kg)  07/31/23 193 lb (87.5 kg)  07/13/23 193 lb 4 oz (87.7 kg)    Physical Exam Constitutional:      General: She is not in acute distress.    Appearance: She is not toxic-appearing.  HENT:     Head: Normocephalic and atraumatic.  Pulmonary:     Effort: Pulmonary effort is normal. No respiratory distress.  Musculoskeletal:     Left foot: Swelling and tenderness present. Normal pulse.       Feet:     Comments: Swelling, redness and tenderness has increased since OV last week.  There is no obvious abscess, no drainage.   Neurological:     Mental Status: She is oriented to person, place, and time.  Psychiatric:        Behavior: Behavior normal.          Assessment & Plan:   Encounter Diagnoses  Name Primary?   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

## 2023-08-09 ENCOUNTER — Telehealth: Payer: Self-pay

## 2023-08-09 NOTE — Telephone Encounter (Signed)
Have attempted to call [facility] today x 5 on the room #27 phone as Ms. Reffett does not have a phone. Attempted to follow up in regards to needs for housing, applying for Medicaid and Care Connect.   Also left message to Ether Griffins SW for Integrated health care as referral was made to him to assist with housing and other needs.  Update. Mr Thurnell Lose is to meet with Ms Rizo and her mother at [facility] at 1:30 PM today, he will call this RN once he meets with them.  Updated [facility] and she will have an appt 08/10/23 at 1PM for follow up regarding her infected foot. [facility] will arrange Transportation with [facility].   Will continue to follow and will continue to explore possible extension of stay at the current hotel pending meeting today with Mr. Yow.   Francee Nodal RN [employer]

## 2023-08-09 NOTE — Telephone Encounter (Signed)
Called while Katrina Good SW with Integrated [facility] was meeting with client to apply for Medicaid, housing and other resources. She states her [employer] phone does not work. Notified her of her appointment at [facility] for 1 pm 08/10/23 for follow up for her foot and that [facility] has been arranged for pick up and to be ready and watching by 1 PM.  Also notified client that this RN contacted the [facility] assistance fund that I had submitted request for assistance for housing and they have agreed to support and extend the stay at [facility] for one more week, check out would be 08/17/23 at 11 am. Myself and representative from [facility] will come to [facility] tomorrow morning to submit payment for another 1 week extension.  She states understanding to all of the above.   Francee Nodal RN [employer]

## 2023-08-10 ENCOUNTER — Encounter: Payer: Self-pay | Admitting: Physician Assistant

## 2023-08-10 ENCOUNTER — Telehealth: Payer: Self-pay

## 2023-08-10 ENCOUNTER — Ambulatory Visit: Payer: Self-pay | Admitting: Physician Assistant

## 2023-08-10 VITALS — BP 149/91 | HR 98 | Temp 97.4°F

## 2023-08-10 DIAGNOSIS — L03116 Cellulitis of left lower limb: Secondary | ICD-10-CM

## 2023-08-10 DIAGNOSIS — L089 Local infection of the skin and subcutaneous tissue, unspecified: Secondary | ICD-10-CM

## 2023-08-10 NOTE — Telephone Encounter (Signed)
Attempted to call client on [employer] phone and no answer x 3  Plan to remind client to be ready and watching for [facility] by 12 for her 1pm appointment today.   I did however talk to her on 08/09/23 while she was with Mr. Yow SW and did tell her that Juel Burrow was scheduled and that she needs to be ready and watching for pick up at 12 noon and she acknowledged at that time.   Francee Nodal RN [employer]

## 2023-08-10 NOTE — Progress Notes (Signed)
BP (!) 149/91   Pulse 98   Temp (!) 97.4 F (36.3 C)   SpO2 96%    Subjective:    Patient ID: Katrina Good, female    DOB: 1971-08-25, 52 y.o.   MRN: [id number]  HPI: Katrina Good is a 52 y.o. female presenting on 08/10/2023 for Foot Swelling   HPI  Pt seen today for follow up plantar puncture wound with infection.  She sustained injury 07/31/23 and went to ER but left without being seen.  She presented to office 08/03/23 and was prescribed cipro 500bid x 7d and xray of the foot was ordered.  She was seen again on 08/08/23 and foot had worsened.  Pt given rocephin in office and CT ordered.  CT results reviewed/motion artifact.  Pt was requested to RTO 08/09/23 but was unable to assure she got message due to she has no phone and is homeless.    She says she has 3 pills left of her cipro that was prescribed on 10/2 (she should have taken all but one by this time).  She got dose rocephin 10 /7 Sge got ct scan 10/7 She feels like the foot is getting worse.   She doesn't feel like she has had any fever.  She had some nausea yesterday but attributed that to the medication.   She says she is taking her bp meds; her bp is up today.     She took an aleve this morning.    Relevant past medical, surgical, family and social history reviewed and updated as indicated. Interim medical history since our last visit reviewed. Allergies and medications reviewed and updated.   Current Outpatient Medications:    aspirin 81 MG chewable tablet, Chew by mouth daily., Disp: , Rfl:    ciprofloxacin (CIPRO) 500 MG tablet, Take 1 tablet (500 mg total) by mouth 2 (two) times daily for 7 days., Disp: 14 tablet, Rfl: 0   levETIRAcetam (KEPPRA) 1000 MG tablet, Take 1/2 tablet every morning, 1 tablet every night, Disp: 45 tablet, Rfl: 0   losartan (COZAAR) 100 MG tablet, Take 1 tablet (100 mg total) by mouth daily., Disp: 30 tablet, Rfl: 0   albuterol (VENTOLIN HFA) 108 (90 Base) MCG/ACT inhaler, INHALE 2  PUFFS BY MOUTH EVERY 6 HOURS AS NEEDED FOR COUGHING, WHEEZING, OR SHORTNESS OF BREATH (Patient not taking: Reported on 08/10/2023), Disp: 20.1 g, Rfl: 0   guaiFENesin (ROBITUSSIN) 100 MG/5ML liquid, Take 15 mLs by mouth every 6 (six) hours as needed for cough or to loosen phlegm. (Patient not taking: Reported on 09/10/2022), Disp: 60 mL, Rfl: 0   Review of Systems  Per HPI unless specifically indicated above     Objective:    BP (!) 149/91   Pulse 98   Temp (!) 97.4 F (36.3 C)   SpO2 96%   Wt Readings from Last 3 Encounters:  08/03/23 193 lb (87.5 kg)  07/31/23 193 lb (87.5 kg)  07/13/23 193 lb 4 oz (87.7 kg)    Physical Exam Constitutional:      General: She is not in acute distress.    Appearance: She is not toxic-appearing.  HENT:     Head: Normocephalic and atraumatic.  Pulmonary:     Effort: Pulmonary effort is normal. No respiratory distress.  Musculoskeletal:     Left foot: Swelling and tenderness present. Normal pulse.     Comments: Swelling left foot about same as Monday but tenderness increased.  Pt tearful with exam.  No abscess or drainage.   Neurological:     Mental Status: She is alert and oriented to person, place, and time.  Psychiatric:        Mood and Affect: Affect is tearful.           Assessment & Plan:    Encounter Diagnoses  Name Primary?   Puncture wound of plantar aspect of left foot with infection, subsequent encounter Yes   Cellulitis of left foot     Due to pt having failed outpatient treatment and that she is high risk (homeless, phone-less, without transportation, unable to read) , she is sent to ER for further evaluation to determine if inpatient treatment indicated.  I called [facility] ED triage desk and notified staff of patient on her way  Pt is scheduled to follow up in office on Tuesday since it is nearly impossible to contact her.

## 2023-08-10 NOTE — Congregational Nurse Program (Signed)
Attempted calls multiple times to [employer] Room at [facility] #[id number]  No answer, had went to meet client after her [facility] visit to bring her food and to update her that her Motel stay has been extended at [facility] until 82/95/62 check out time for [employer] on [address] [place] is 11 am.  Assistance provided by [employer]. Was unable to reach client nor leave a message.   Update: Received call from [facility] staff at 1:55 PM after I left the [facility] at 1:45 PM that client had now arrived, and provider was sending client to ER as her left foot wound had not improved.   Will attempt to continue to follow as needed. Communication difficult as client has no cell phone and she states the phone in her Motel room doesn't work well.   Francee Nodal RN [employer]

## 2023-08-12 NOTE — Congregational Nurse Program (Signed)
Went to attempt follow up with Katrina Good/ [facility] client at [facility] room 27 as have been unable to reach by [facility] phone and client has no cell phone.  Lelon Mast MSW intern accompanied RN on visit.  Ms Ow was present at [facility] along with her Mother Adela Lank and her sister Wille Celeste who has been providing Ms Antonucci transportation.  Application for [facility] and [place] [facility] signed and returned to [facility] care guide for [facility] for processing and entering data.  Discussed that she has the [facility] Room until 08/17/23 check out time 11 am.  Ether Griffins SW integrated Health met with Ms Seabrooks and mother on Monday, began applications for housing, needed documents listed were SS Cards for both Ms Pettus and Mother Dossie Arbour. Medicaid application for Ms Locastro began. Food Stamps applications sent to DSS for review. DSS has tried to reach today and Motel room number phone not working well and she did not reach. Attempted to call Dss  with client no answer, left my name and phone number.  Ms Palla provided mother's income verification and letter stating that Mother's new SS card will be mailed out in 2 weeks. Ms Hemric still needs furhter documentation to obtain another SS Card. (Scanned and emailed Ether Griffins SW this information and notified him by phone what Ms Larusso needed to complete SS card duplicate, he will continue to assist)  Ms Archuleta and sister Wille Celeste are verbally agreeable to use sister's phone for contact  Wille Celeste [phone] will add to fhases and Epic as contacts with permission.  Discussed recommendations of provider on 08/10/23 to go to the ER for evaluation and treatment of left foot wound that had not improved on outpatient antibiotics per provider notes. She did not go to ER and states she is scared because she didn't  "Want my toes cut off with gangrene" discussed that provider recommended she go to ER to prevent the infection from worsening and potentially causing  further complications. Explained risk and reasons to both she and her sister.  Client is aware of her appointment 08/16/23 at 10 am and she states she knows and has transportation. Will attempt to follow up in person with her after that appointment.  Encouraged her to keep her foot dry and clean and stay off it as much as possible. She reports completion of antibiotics.  Will continue to follow as needed.  Francee Nodal RN [employer]

## 2023-08-16 ENCOUNTER — Ambulatory Visit: Payer: Self-pay | Admitting: Physician Assistant

## 2023-08-16 ENCOUNTER — Encounter: Payer: Self-pay | Admitting: Physician Assistant

## 2023-08-16 ENCOUNTER — Telehealth: Payer: Self-pay | Admitting: Family Medicine

## 2023-08-16 VITALS — BP 147/91 | HR 98 | Temp 97.5°F

## 2023-08-16 DIAGNOSIS — Z9189 Other specified personal risk factors, not elsewhere classified: Secondary | ICD-10-CM

## 2023-08-16 DIAGNOSIS — I10 Essential (primary) hypertension: Secondary | ICD-10-CM

## 2023-08-16 DIAGNOSIS — Z5900 Homelessness unspecified: Secondary | ICD-10-CM

## 2023-08-16 DIAGNOSIS — F17200 Nicotine dependence, unspecified, uncomplicated: Secondary | ICD-10-CM

## 2023-08-16 DIAGNOSIS — Z550 Illiteracy and low-level literacy: Secondary | ICD-10-CM

## 2023-08-16 DIAGNOSIS — M79672 Pain in left foot: Secondary | ICD-10-CM

## 2023-08-16 DIAGNOSIS — Z636 Dependent relative needing care at home: Secondary | ICD-10-CM

## 2023-08-16 DIAGNOSIS — G40909 Epilepsy, unspecified, not intractable, without status epilepticus: Secondary | ICD-10-CM

## 2023-08-16 DIAGNOSIS — Z748 Other problems related to care provider dependency: Secondary | ICD-10-CM

## 2023-08-16 NOTE — Telephone Encounter (Signed)
That's fine

## 2023-08-16 NOTE — Telephone Encounter (Signed)
Called patient, gave daughter number, waiting on a call back from patient. Need to get a good phone number.

## 2023-08-16 NOTE — Telephone Encounter (Signed)
Needs new patient approval

## 2023-08-16 NOTE — Progress Notes (Signed)
BP (!) 147/91   Pulse 98   Temp (!) 97.5 F (36.4 C)   SpO2 99%    Subjective:    Patient ID: Katrina Good, female    DOB: Jul 18, 1971, 52 y.o.   MRN: [id number]  HPI: Katrina Good is a 52 y.o. female presenting on 08/16/2023 for No chief complaint on file.   HPI    Pt is 52yoF with history HTN, seizure disorder, tobacco use, substance abuse, noncompliance, low literacy, who most recently got out of jail end of August and recently became homeless.  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  Pt is in today for recheck of her foot.  She was seen last week and was recommended to go to ER for infected foot wound that failed outpatient treatment.  She did not go to ER.  She says her foot is improved today  Pt has been approved for medicaid since she was seen in office last week.  Pt is still living in motel arranged for by care connect due to homelessness.    Pt says she is feeling fine today.     Relevant past medical, surgical, family and social history reviewed and updated as indicated. Interim medical history since our last visit reviewed. Allergies and medications reviewed and updated.   Current Outpatient Medications:    aspirin 81 MG chewable tablet, Chew by mouth daily., Disp: , Rfl:    levETIRAcetam (KEPPRA) 1000 MG tablet, Take 1/2 tablet every morning, 1 tablet every night, Disp: 45 tablet, Rfl: 0   losartan (COZAAR) 100 MG tablet, Take 1 tablet (100 mg total) by mouth daily., Disp: 30 tablet, Rfl: 0   albuterol (VENTOLIN HFA) 108 (90 Base) MCG/ACT inhaler, INHALE 2 PUFFS BY MOUTH EVERY 6 HOURS AS NEEDED FOR COUGHING, WHEEZING, OR SHORTNESS OF BREATH (Patient not taking: Reported on 08/10/2023), Disp: 20.1 g, Rfl: 0   guaiFENesin (ROBITUSSIN) 100 MG/5ML liquid, Take 15 mLs by mouth every 6 (six) hours as needed for cough or to loosen phlegm. (Patient not taking: Reported on 09/10/2022), Disp: 60 mL, Rfl: 0    Review of  Systems  Per HPI unless specifically indicated above     Objective:    BP (!) 147/91   Pulse 98   Temp (!) 97.5 F (36.4 C)   SpO2 99%   Wt Readings from Last 3 Encounters:  08/03/23 193 lb (87.5 kg)  07/31/23 193 lb (87.5 kg)  07/13/23 193 lb 4 oz (87.7 kg)    Physical Exam Constitutional:      General: She is not in acute distress.    Appearance: She is not toxic-appearing.  HENT:     Head: Normocephalic and atraumatic.  Cardiovascular:     Rate and Rhythm: Normal rate and regular rhythm.  Pulmonary:     Effort: Pulmonary effort is normal. No respiratory distress.     Breath sounds: Normal breath sounds.  Musculoskeletal:     Cervical back: Neck supple.     Right lower leg: No edema.     Left lower leg: No edema.     Left foot: Swelling and tenderness present.     Comments: There is still tenderness with mild swelling and erythema left 1st MTP area plantar surface and medial aspect.  Large callus present.  No abscess or drainage.   Pt still walking with a limp due to foot pain.   Lymphadenopathy:     Cervical: No cervical adenopathy.  Neurological:  Mental Status: She is alert and oriented to person, place, and time.  Psychiatric:        Behavior: Behavior normal.              Assessment & Plan:    Encounter Diagnoses  Name Primary?   Left foot pain Yes   Primary hypertension    Seizure disorder (HCC)    Homelessness    Tobacco use disorder    Low-level of literacy    Caregiver stress    Lack of telephone at home    Dependent for transportation       Newly insured -pt understands that she is no longer eligible for [facility] now that she has been approved for medicaid -pt is given a list of providers in [place] who are accepting medicaid patients.  The number for the office in [place] was pointed out to her  she is encouraged to call this week so she can get established.   seizures -Pt is given phone number for neurologist and she  is encouraged to call for appointment for management of her seizures -she is to continue her keppra  Htn -pt to continue with her losartan.  She is aware she needs to have it rechecked soon as it was elevated today  Foot -pt encouraged to have it rechecked by her new provider.  She is to go to ER if it worsens  Smoking -pt is notified that screening ct will be cancelled and will need to be re-ordered by her new provider (medicaid would not cover as ordered currently).  She states understanding

## 2023-08-16 NOTE — Patient Instructions (Addendum)
Neurology  [phone]

## 2023-08-16 NOTE — Telephone Encounter (Deleted)
scheduled

## 2023-08-17 NOTE — Congregational Nurse Program (Signed)
Dept: [phone]   Congregational Nurse Program Note  Date of Encounter: 08/17/2023  Past Medical History: Past Medical History:  Diagnosis Date   Acid reflux    Asthma    Bronchitis    Chronic heel pain, left    Diabetes mellitus    Hypertension    Seizures (HCC)    onset was 08/2020   Substance abuse (HCC)    pt denies   Suicidal thoughts    pt denies    Encounter Details:  Community Questionnaire - 08/16/23 1000       Questionnaire   Ask client: Do you give verbal consent for me to treat you today? N/A    Student Assistance [employer]    Location Patient Served  Katrina Good    Encounter Setting CN site    Population Status Unhoused    Insurance [place]    Insurance/Financial Assistance Referral Medicaid;[employer] Patient Asst. Fund    Medication Have Medication Insecurities;Provided Medication Assistance    Medical Provider Yes    Screening Referrals Made N/A    Medical Referrals Made Health Department    Medical Appointment Completed N/A    CNP Interventions Advocate/Support;Case Management;Navigate Healthcare System;Counsel;Educate    Screenings CN Performed N/A    ED Visit Averted N/A    Life-Saving Intervention Made N/A      Questionnaire   Housing/Utilities No permanent housing;Referred to housing/utility assistance program            Client of [facility] and Katrina Bower, transitioning to [place] as of this week. She [facility] as she now has Medicaid and they are unable to see anyone with Insurance.   Client assisted with calling [facility] by [facility] staff and was told they would review her records and contact her.  Client has been followed closely for a puncture wound to her left foot that has been infected by the free clinic.  This RN discussed with Ms Katrina Good that getting a provider that could see her as soon as possible would be the better choice and she agrees. Decided to contact [facility]  and was able to schedule her to be seen with Katrina Good on 08/22/23 at 1pm to arrive at 12:45pm and to bring all her medication bottles with her to appointment and then sign a release to get her records from Katrina Good. Written appointment provided to client and she has Good and phone number.  Called [facility] with client and was able to request Health Department to be placed on her card as her primary care. During call we also inquired about transportation and Ms. Katrina Good would call the customer service number at least 2 days prior to need to request. Also inquired about free phone that is provided through assurance lifeline, provided client with number. Offered to call with her and she wanted to call with her sister. She will get 250 free minutes of call and unlimited text. They discussed her Medicaid card will go to Katrina Good.  Client states she did get food stamps but when DSS called she did not find her card. Katrina Good at DSS with client and she will issue her a new food stamp card that will arrive at Katrina Good in about 14 days.  Called Katrina Good SW integrated Health for follow up while client in office, he sent parts of Katrina Good for Housing for Ms Katrina Good's mother to sign with full signature and  he also sent the mother's food stamp Good and signed by mother. All applications signed and scanned and sent by to Katrina Good by secure email.  Housing: Motel will end and check out 08/17/23 no more assistance available to extend motel stay, Her sister Katrina Good has also become homeless and staying with them. Katrina Good has income per Ms Katrina Good. Discussed plans and if family would let them stay while they await housing, she states no, they have too many in their home now. We will collect any blankets, coats or gloves for client's with [facility] staff if available will contact Ms Katrina Good. Discussed that she is still eligible to shop Katrina Good.  Transportation: at this time Katrina Good is providing transportation. She states they plan to go get her ID this week at no cost due to homelessness and birth certificates for housing.   Will continue to follow as needed for resources as needed.  Katrina Good

## 2023-08-19 ENCOUNTER — Telehealth: Payer: Self-pay | Admitting: Neurology

## 2023-08-31 ENCOUNTER — Ambulatory Visit (HOSPITAL_COMMUNITY): Payer: Self-pay

## 2023-09-06 DIAGNOSIS — F1410 Cocaine abuse, uncomplicated: Secondary | ICD-10-CM | POA: Diagnosis not present

## 2023-09-14 DIAGNOSIS — F1420 Cocaine dependence, uncomplicated: Secondary | ICD-10-CM | POA: Diagnosis not present

## 2023-09-21 DIAGNOSIS — F1410 Cocaine abuse, uncomplicated: Secondary | ICD-10-CM | POA: Diagnosis not present

## 2023-09-22 DIAGNOSIS — Z7151 Drug abuse counseling and surveillance of drug abuser: Secondary | ICD-10-CM | POA: Diagnosis not present

## 2023-09-22 DIAGNOSIS — F1410 Cocaine abuse, uncomplicated: Secondary | ICD-10-CM | POA: Diagnosis not present

## 2023-09-22 DIAGNOSIS — F1420 Cocaine dependence, uncomplicated: Secondary | ICD-10-CM | POA: Diagnosis not present

## 2023-09-23 DIAGNOSIS — F1420 Cocaine dependence, uncomplicated: Secondary | ICD-10-CM | POA: Diagnosis not present

## 2023-09-25 DIAGNOSIS — F1420 Cocaine dependence, uncomplicated: Secondary | ICD-10-CM | POA: Diagnosis not present

## 2023-09-26 DIAGNOSIS — F1420 Cocaine dependence, uncomplicated: Secondary | ICD-10-CM | POA: Diagnosis not present

## 2023-09-27 DIAGNOSIS — F1420 Cocaine dependence, uncomplicated: Secondary | ICD-10-CM | POA: Diagnosis not present

## 2023-09-28 DIAGNOSIS — F1420 Cocaine dependence, uncomplicated: Secondary | ICD-10-CM | POA: Diagnosis not present

## 2024-02-07 ENCOUNTER — Telehealth: Payer: Self-pay

## 2024-02-07 NOTE — Telephone Encounter (Signed)
 Completed call to transitioned [facility] client who had transitioned to Medicaid. She needed contact information for Ether Griffins SW with integrated Health. Text message of contact information provided. She states she has an appointment with [facility] soon. Discussed that her new provider could request her records from the [facility]. She reports she is awaiting a  new Medicaid card with her provider name on it.  Discussed that she is no longer eligible for [facility], but she can call for any questions or resources that we may assist with.   Katrina Nodal RN [employer]

## 2024-02-20 ENCOUNTER — Encounter: Payer: Self-pay | Admitting: *Deleted

## 2024-02-20 ENCOUNTER — Other Ambulatory Visit (HOSPITAL_COMMUNITY): Payer: Self-pay | Admitting: *Deleted

## 2024-02-20 DIAGNOSIS — M545 Low back pain, unspecified: Secondary | ICD-10-CM

## 2024-02-28 ENCOUNTER — Telehealth: Payer: Self-pay | Admitting: Neurology

## 2024-02-28 MED ORDER — LEVETIRACETAM 1000 MG PO TABS: ORAL_TABLET | ORAL | 0 refills | Status: DC

## 2024-02-28 NOTE — Telephone Encounter (Signed)
 Patient needs a refill on the Keppra  she is out of medication and has appt with Dr Ty Gales  on 03-19-24. She uses the [facility] in [place]

## 2024-02-28 NOTE — Telephone Encounter (Signed)
 Refill has been sent for pt. Pt was called,

## 2024-03-19 ENCOUNTER — Encounter: Payer: Self-pay | Admitting: Neurology

## 2024-03-19 ENCOUNTER — Ambulatory Visit: Payer: MEDICAID | Admitting: Neurology

## 2024-03-19 VITALS — BP 124/80 | HR 90 | Ht 69.0 in | Wt 200.4 lb

## 2024-03-19 DIAGNOSIS — R531 Weakness: Secondary | ICD-10-CM

## 2024-03-19 DIAGNOSIS — R569 Unspecified convulsions: Secondary | ICD-10-CM | POA: Diagnosis not present

## 2024-03-19 DIAGNOSIS — R519 Headache, unspecified: Secondary | ICD-10-CM

## 2024-03-19 MED ORDER — NORTRIPTYLINE HCL 25 MG PO CAPS: 25.0000 mg | ORAL_CAPSULE | Freq: Every day | ORAL | 3 refills | Status: AC

## 2024-03-19 MED ORDER — LEVETIRACETAM 1000 MG PO TABS: ORAL_TABLET | ORAL | 3 refills | Status: AC

## 2024-03-19 NOTE — Progress Notes (Signed)
 NEUROLOGY FOLLOW UP OFFICE NOTE  Katrina Good [id number] [birth year]  HISTORY OF PRESENT ILLNESS: I had the pleasure of seeing Katrina Good in follow-up in the neurology clinic on 03/19/2024.  The patient was last seen in November 2023 for seizures. She is accompanied by her mother who helps supplement the history today.  Records and images were personally reviewed where available.  On initial visit, semiology of events raised concern for non-epileptic events, she reported they would only occur when stressed out. Her wake and sleep EEG in 10/2022 was normal. She has been on Levetiracetam  500mg  in AM, 1000mg  in PM and reports she was having seizures probably every other day before she was incarcerated. While in jail, she was staying on medication on time regularly and just had 1 or 2 seizures. She had 2 while incarcerated in April, she reports it was a bad one, she had right arm and leg weakness that have not improved since then. She can barely hold heavy things and cannot lift her right leg up. She recalls she got too hot, no missed medications. She then had one with her son either 5/5 or 5/6, she is not sure what happened, her son said she was talking to him then started shaking. No injuries. She has noticed her head starts hurting before she has a seizure, and then after a seizure, her nose starts bleeding. Her mother has seen her staring off sometimes, she was to sit down. Her mother has seen the shaking and notes eyes are closed. Patient denies any prior warning symptoms.   She has started having frequent headaches for the past 2 months, which is new for her. No prior history of migraines. She has a throbbing pain in her temples, no nausea/vomiting. Loud noises can trigger a headache. She has been taking 4-5 Tylenol  tablets daily for the past 2 months. She has to take deep breaths sometimes. She only gets 1-2 hours of sleep. She lives with her mother and her children. She is not driving. She has  neck and back pain, as well as pain around the right axilla.    History on Initial Assessment 09/10/2022: This is a 53 year old right-handed woman with a history of hypertension, presenting to establish care for seizures. She is accompanied by her fiance Katrina Good who helps supplement the history. She was previously seen by neurologist Dr. Salli Crawley, records were reviewed. The first seizure occurred in October 2021, she was in the ER for an upper respiratory infection but mentioned episodes of seizure-like actvity. She reported she would be walking then suddenly feel pain in her feet, freeze, unable to talk or move. They would last 5-10 minutes then resolve, feeling tired after. She was started on Levetiracetam  at that time, with reduction in episodes. Per notes, she was previously having several episodes per day or per week. Brain MRI with and without contrast in 08/2020 was normal. Last visit at [facility] was in 09/2021, Levetiracetam  dose increased to 500mg  in AM, 1000mg  in PM. Since then, Katrina Good reports having seizures every 2 weeks. She denies any prior warning symptoms. Katrina Good reports her eyes would get watery, she feels weak and lays down, then has body shaking for 3-4 minutes. No tongue bite or incontinence. She would feel sore and thirsty after, no focal weakness. She would not recall having a seizure and denies she had one. Last seizure was a week ago. Sometimes she notes a bitter taste like sour milk. Sometimes she has not appetite and everything tastes  funny. When the seizures first started, she was having numbness and tingling in her feet and would alert Katrina Good she would fall. He would grab her and then shaking starts. She denies any further numbness/tingling in her feet. Katrina Good notes that seizure triggers are stress and anxiety. Seizures only come on when she gets upset. She broke her toe, got upset, and went into a seizure. She only gets a headache when she gets upset or with loud noises. 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 She reports her PCP has her set up with Psychiatry. Katrina Good has not witnessed any staring/unresponsive episodes. She denies any focal numbness/tingling/weakness, no myoclonic jerks. She usually gets 3-4 hours of sleep and feels tired. She lies on the examination table throughout the duration of the visit today. She had recently broke her right 2nd toe trying to kick a cockroach, hitting the wall. She reports back pain radiating down her right leg and pain in the right foot. Her father had seizures when younger. Otherwise she had a normal birth and early development.  There is no history of febrile convulsions, CNS infections such as meningitis/encephalitis, significant traumatic brain injury, neurosurgical procedures.    PAST MEDICAL HISTORY: Past Medical History:  Diagnosis Date   Acid reflux    Asthma    Bronchitis    Chronic heel pain, left    Diabetes mellitus    Hypertension    Seizures (HCC)    onset was 08/2020   Substance abuse (HCC)    pt denies   Suicidal thoughts    pt denies    MEDICATIONS: Current Outpatient Medications on File Prior to Visit  Medication Sig Dispense Refill   aspirin  81 MG chewable tablet Chew by mouth daily.     guaiFENesin  (ROBITUSSIN) 100 MG/5ML liquid Take 15 mLs by mouth every 6 (six) hours as needed for cough or to loosen phlegm. 60 mL 0   levETIRAcetam  (KEPPRA ) 1000 MG tablet Take 1/2 tablet every morning, 1 tablet every night 45 tablet 0   losartan  (COZAAR ) 100 MG tablet Take 1 tablet (100 mg total) by mouth daily. 30 tablet 0   albuterol  (VENTOLIN  HFA) 108 (90 Base) MCG/ACT inhaler INHALE 2 PUFFS BY MOUTH EVERY 6 HOURS AS NEEDED FOR COUGHING, WHEEZING, OR SHORTNESS OF BREATH (Patient not taking: Reported on 03/19/2024) 20.1 g 0   No current facility-administered medications on file prior to visit.    ALLERGIES: Allergies  Allergen Reactions   Doxycycline  Hives   Strawberry Extract Swelling    Other  reaction(s): Facial swelling    FAMILY HISTORY: Family History  Problem Relation Age of Onset   Diabetes Mother    Hypertension Mother    Heart failure Mother    Seizures Father    Cancer Father    Hypertension Father    Heart failure Father    Asthma Father     SOCIAL HISTORY: Social History   Socioeconomic History   Marital status: Single    Spouse name: Not on file   Number of children: 2   Years of education: 11   Highest education level: Not on file  Occupational History    Employer: UNEMPLOYED  Tobacco Use   Smoking status: Every Day    Current packs/day: 1.00    Average packs/day: 2.0 packs/day for 33.7 years (66.7 ttl pk-yrs)    Types: Cigarettes    Start date: 07/03/2023   Smokeless tobacco: Never   Tobacco comments:    2-3 packs  Vaping Use   Vaping status: Never Used  Substance and Sexual Activity   Alcohol use: Not Currently   Drug use: Yes    Types: Marijuana, "Crack" cocaine, Cocaine    Comment: 9/15/20222 used yesterday- crack cocaine; uses crack (but not cocaine).  not MJ user   Sexual activity: Not Currently    Birth control/protection: Surgical  Other Topics Concern   Not on file  Social History Narrative   01/14/21 lives with mother   Right handed   No caffeine   One story home   Not currently working   Social Drivers of Corporate investment banker Strain: Not on file  Food Insecurity: Not on file  Transportation Needs: Unmet Transportation Needs (09/21/2022)   PRAPARE - Administrator, Civil Service (Medical): Yes    Lack of Transportation (Non-Medical): Yes  Physical Activity: Not on file  Stress: Not on file  Social Connections: Not on file  Intimate Partner Violence: Not on file     PHYSICAL EXAM: Vitals:   03/19/24 1336  BP: 124/80  Pulse: 90  SpO2: 99%   General: No acute distress Head:  Normocephalic/atraumatic Skin/Extremities: No rash, no edema Neurological Exam: alert and awake. No aphasia or  dysarthria. Fund of knowledge is appropriate.  Attention and concentration are normal.   Cranial nerves: Pupils equal, round. Extraocular movements intact with no nystagmus. Visual fields full.  No facial asymmetry.  Motor: Bulk and tone normal, muscle strength 5/5 on left UE and LE, 4/5 proximal right UE and LE with pain on right shoulder abduction. 5/5 distally. Reflexes +2 throughout.  Finger to nose testing intact.  Gait slow and cautious, no ataxia.    IMPRESSION: This is a 53 yo RH woman with a history of hypertension, lost to follow-up for over a year for seizures. They had previously described shaking episodes triggered when she is upset, raising concern for functional seizures. She reports that when taking Levetiracetam  consistently, she has less seizures. Last seizure was 03/05/24. We discussed repeating 1-hour EEG, and if normal, doing prolonged EEG for characterization. Continue Levetiracetam  1000mg  1/2 tab in AM, 1 tab in PM for now. She has been having daily headaches for the past 2 months, as well as right-sided weakness since the seizure in April. MRI brain without contrast will be ordered to assess for underlying structural abnormality. She is not sleeping well, we discussed the importance of sleep hygiene for headaches, as well as minimizing over the counter pain medication to 3 a week to avoid rebound headaches. We discussed starting nortriptyline  25mg  at bedtime for headache prophylaxis, side effects discussed. She will be referred for PT for right-sided weakness. She would like to continue working but has been fired due to her seizures, information for the [facility] [place] provided to help with support and avenues for work. She does not drive. Follow-up in 3 months, call for any changes.   Thank you for allowing me to participate in her care.  Please do not hesitate to call for any questions or concerns.    Rayfield Cairo, M.D.

## 2024-03-19 NOTE — Progress Notes (Signed)
 Pt MRI has been approved: approval number is [id number] pt is scheduled for 03/23/24 at 12noon for check in and the test is at 12:30. Pt has been called an informed ,

## 2024-03-19 NOTE — Patient Instructions (Signed)
 Good to see you.  Start Nortriptyline  25mg : take 1 capsule every night. Update me in a month how you are feeling, we may increase dose if needed.  2. It is very important to start cutting down on any over the counter headache medication to only 3 a week, otherwise headaches will be harder to treat (rebound headaches from taking too much Tylenol /Ibuprofen /Excedrin).  3. Continue Levetiracetam  1000mg : take 1/2 tablet in AM, 1 tablet in PM  4. Schedule MRI brain without contrast at [facility]  5. Schedule 1-hour EEG. If normal, we will plan for prolonged EEG  6. Referral will be sent to PT at [facility] for right-sided weakness  7. Here is the number for the [facility] [place] Helpline  They have been helpful to my patients for medications, support, and guidance for avenues for work Helpline: [phone] [phone]  8. Follow-up in 3 months, call for any changes   Seizure Precautions: 1. If medication has been prescribed for you to prevent seizures, take it exactly as directed.  Do not stop taking the medicine without talking to your doctor first, even if you have not had a seizure in a long time.   2. Avoid activities in which a seizure would cause danger to yourself or to others.  Don't operate dangerous machinery, swim alone, or climb in high or dangerous places, such as on ladders, roofs, or girders.  Do not drive unless your doctor says you may.  3. If you have any warning that you may have a seizure, lay down in a safe place where you can't hurt yourself.    4.  No driving for 6 months from last seizure, as per [place]  state law.   Please refer to the following link on the [employer]'s website for more information: http://www.epilepsyfoundation.org/answerplace/Social/driving/drivingu.cfm   5.  Maintain good sleep hygiene. Avoid alcohol.  6.  Contact your doctor if you have any problems that may be related to the medicine you are taking.  7.  Call 911 and  bring the patient back to the ED if:        A.  The seizure lasts longer than 5 minutes.       B.  The patient doesn't awaken shortly after the seizure  C.  The patient has new problems such as difficulty seeing, speaking or moving  D.  The patient was injured during the seizure  E.  The patient has a temperature over 102 F (39C)  F.  The patient vomited and now is having trouble breathing

## 2024-03-23 ENCOUNTER — Ambulatory Visit (HOSPITAL_COMMUNITY)
Admission: RE | Admit: 2024-03-23 | Discharge: 2024-03-23 | Disposition: A | Discharge status: Home / Self Care | Payer: MEDICAID | Source: Ambulatory Visit | Attending: Neurology | Admitting: Neurology

## 2024-03-23 DIAGNOSIS — R531 Weakness: Secondary | ICD-10-CM | POA: Insufficient documentation

## 2024-03-23 DIAGNOSIS — R519 Headache, unspecified: Secondary | ICD-10-CM | POA: Diagnosis present

## 2024-04-05 ENCOUNTER — Ambulatory Visit: Payer: Self-pay | Admitting: Neurology

## 2024-04-12 NOTE — Therapy (Incomplete)
 OUTPATIENT PHYSICAL THERAPY NEURO EVALUATION   Patient Name: MAYVIS AGUDELO MRN: [id number] DOB:[birth year], 53 y.o., female Today's Date: 04/12/2024   PCP: *** REFERRING PROVIDER: ***  END OF SESSION:   Past Medical History:  Diagnosis Date   Acid reflux    Asthma    Bronchitis    Chronic heel pain, left    Diabetes mellitus    Hypertension    Seizures (HCC)    onset was 08/2020   Substance abuse (HCC)    pt denies   Suicidal thoughts    pt denies   Past Surgical History:  Procedure Laterality Date   ABDOMINAL HYSTERECTOMY     partial   CESAREAN SECTION     x 2   Patient Active Problem List   Diagnosis Date Noted   Seizure disorder (HCC) 06/21/2022   Primary hypertension 06/21/2022   Anxiety 06/21/2022   Substance abuse (HCC) 06/21/2022   Sepsis (HCC) 07/19/2021   Puncture wound of left foot with complication 05/03/2018   Calcaneus fracture, left 05/03/2018   Cocaine abuse (HCC) 05/03/2018   Cellulitis of left foot 05/02/2018   Hypokalemia    Colles' fracture of right radius 03/13/2012    ONSET DATE: ***  REFERRING DIAG: R53.1 (ICD-10-CM) - Right sided weakness  THERAPY DIAG:  No diagnosis found.  Rationale for Evaluation and Treatment: Rehabilitation  SUBJECTIVE:                                                                                                                                                                                             SUBJECTIVE STATEMENT: *** Pt accompanied by: {accompnied:27141}  PERTINENT HISTORY: seizures  PAIN:  Are you having pain? {OPRCPAIN:27236}  PRECAUTIONS: {Therapy precautions:24002}  RED FLAGS: {PT Red Flags:29287}   WEIGHT BEARING RESTRICTIONS: {Yes ***/No:24003}  FALLS: Has patient fallen in last 6 months? {fallsyesno:27318}  LIVING ENVIRONMENT: Lives with: {OPRC lives with:25569::lives with their family} Lives in: {Lives in:25570} Stairs: {opstairs:27293} Has following equipment at  home: {Assistive devices:23999}  PLOF: {PLOF:24004}  PATIENT GOALS: ***  OBJECTIVE:  Note: Objective measures were completed at Evaluation unless otherwise noted.  DIAGNOSTIC FINDINGS: ***  COGNITION: Overall cognitive status: {cognition:24006}   SENSATION: {sensation:27233}  COORDINATION: ***  EDEMA:  {edema:24020}  MUSCLE TONE: {LE tone:25568}  MUSCLE LENGTH: Hamstrings: Right *** deg; Left *** deg Andy Bannister test: Right *** deg; Left *** deg  DTRs:  {DTR SITE:24025}  POSTURE: {posture:25561}  LOWER EXTREMITY ROM:     {AROM/PROM:27142}  Right Eval Left Eval  Hip flexion    Hip extension    Hip abduction    Hip adduction  Hip internal rotation    Hip external rotation    Knee flexion    Knee extension    Ankle dorsiflexion    Ankle plantarflexion    Ankle inversion    Ankle eversion     (Blank rows = not tested)  LOWER EXTREMITY MMT:    MMT Right Eval Left Eval  Hip flexion    Hip extension    Hip abduction    Hip adduction    Hip internal rotation    Hip external rotation    Knee flexion    Knee extension    Ankle dorsiflexion    Ankle plantarflexion    Ankle inversion    Ankle eversion    (Blank rows = not tested)  BED MOBILITY:  {bed mobility:32615:p}  TRANSFERS: {transfers eval:32620}  RAMP:  {ramp eval:32616}  CURB:  {curb eval:32617}  STAIRS: {stairs eval:32618} GAIT: Findings: {GaitneuroPT:32644::Distance walked: ***,Comments: ***}  FUNCTIONAL TESTS:  {Functional tests:24029}  PATIENT SURVEYS:  {rehab surveys:24030}                                                                                                                              TREATMENT DATE: 04/13/24 physical therapy evaluation and HEP instruction    PATIENT EDUCATION: Education details: Patient educated on exam findings, POC, scope of PT, HEP, and ***. Person educated: Patient Education method: Explanation, Demonstration, and  Handouts Education comprehension: verbalized understanding, returned demonstration, verbal cues required, and tactile cues required   HOME EXERCISE PROGRAM: ***  GOALS: Goals reviewed with patient? {yes/no:20286}  SHORT TERM GOALS: Target date: ***  *** Baseline: Goal status: INITIAL  2.  *** Baseline:  Goal status: INITIAL  3.  *** Baseline:  Goal status: INITIAL  4.  *** Baseline:  Goal status: INITIAL  5.  *** Baseline:  Goal status: INITIAL  6.  *** Baseline:  Goal status: INITIAL  LONG TERM GOALS: Target date: ***  *** Baseline:  Goal status: INITIAL  2.  *** Baseline:  Goal status: INITIAL  3.  *** Baseline:  Goal status: INITIAL  4.  *** Baseline:  Goal status: INITIAL  5.  *** Baseline:  Goal status: INITIAL  6.  *** Baseline:  Goal status: INITIAL  ASSESSMENT:  CLINICAL IMPRESSION: Patient is a 53 y.o. female who was seen today for physical therapy evaluation and treatment for R53.1 (ICD-10-CM) - Right sided weakness.   OBJECTIVE IMPAIRMENTS: {opptimpairments:25111}.   ACTIVITY LIMITATIONS: {activitylimitations:27494}  PARTICIPATION LIMITATIONS: {participationrestrictions:25113}  PERSONAL FACTORS: {Personal factors:25162} are also affecting patient's functional outcome.   REHAB POTENTIAL: Good  CLINICAL DECISION MAKING: Evolving/moderate complexity  EVALUATION COMPLEXITY: Moderate  PLAN:  PT FREQUENCY: {rehab frequency:25116}  PT DURATION: {rehab duration:25117}  PLANNED INTERVENTIONS: 97164- PT Re-evaluation, 97110-Therapeutic exercises, 97530- Therapeutic activity, [id number]- Neuromuscular re-education, 97535- Self Care, 16109- Manual therapy, [id number]- Gait training, [id number]- Orthotic Fit/training, [id number]- Canalith repositioning, [id number]- Aquatic Therapy, [id number]- Splinting, [id number]- Wound care (first 20 sq cm), 60454- Wound care (  each additional 20 sq cm)Patient/Family education, Balance training, Stair training, Taping, Dry  Needling, Joint mobilization, Joint manipulation, Spinal manipulation, Spinal mobilization, Scar mobilization, and DME instructions.   PLAN FOR NEXT SESSION: Review HEP and goals   1:36 PM, 04/12/24 Madonna Flegal Small Quayshawn Nin MPT [facility] [place] [phone]

## 2024-04-13 ENCOUNTER — Telehealth (HOSPITAL_COMMUNITY): Payer: Self-pay

## 2024-04-13 ENCOUNTER — Ambulatory Visit (HOSPITAL_COMMUNITY): Payer: MEDICAID | Attending: Neurology

## 2024-04-13 NOTE — Telephone Encounter (Signed)
 Spoke with patient regarding missed appointment for physical therapy evaluation.  She states she did not have transportation this morning to attend.  She states she will call back to reschedule.    8:46 AM, 04/13/24 Katrina Good Katrina Good MPT [facility] [address] [phone]

## 2024-04-26 ENCOUNTER — Ambulatory Visit (HOSPITAL_COMMUNITY)
Admission: RE | Admit: 2024-04-26 | Discharge: 2024-04-26 | Disposition: A | Discharge status: Home / Self Care | Payer: MEDICAID | Source: Ambulatory Visit | Attending: *Deleted | Admitting: *Deleted

## 2024-04-26 DIAGNOSIS — M545 Low back pain, unspecified: Secondary | ICD-10-CM | POA: Insufficient documentation

## 2024-05-02 ENCOUNTER — Other Ambulatory Visit: Payer: MEDICAID

## 2024-05-02 ENCOUNTER — Encounter: Payer: Self-pay | Admitting: Neurology

## 2024-05-29 NOTE — Therapy (Signed)
 OUTPATIENT PHYSICAL THERAPY NEURO EVALUATION   Patient Name: Katrina Good MRN: [id number] DOB:10-Mar-1971, 53 y.o., female Today's Date: 05/29/2024   PCP: *** REFERRING PROVIDER: ***  END OF SESSION:   Past Medical History:  Diagnosis Date   Acid reflux    Asthma    Bronchitis    Chronic heel pain, left    Diabetes mellitus    Hypertension    Seizures (HCC)    onset was 08/2020   Substance abuse (HCC)    pt denies   Suicidal thoughts    pt denies   Past Surgical History:  Procedure Laterality Date   ABDOMINAL HYSTERECTOMY     partial   CESAREAN SECTION     x 2   Patient Active Problem List   Diagnosis Date Noted   Seizure disorder (HCC) 06/21/2022   Primary hypertension 06/21/2022   Anxiety 06/21/2022   Substance abuse (HCC) 06/21/2022   Sepsis (HCC) 07/19/2021   Puncture wound of left foot with complication 05/03/2018   Calcaneus fracture, left 05/03/2018   Cocaine abuse (HCC) 05/03/2018   Cellulitis of left foot 05/02/2018   Hypokalemia    Colles' fracture of right radius 03/13/2012    ONSET DATE: ***  REFERRING DIAG: R53.1 (ICD-10-CM) - Right sided weakness   THERAPY DIAG:  No diagnosis found.  Rationale for Evaluation and Treatment: Rehabilitation  SUBJECTIVE:                                                                                                                                                                                             SUBJECTIVE STATEMENT: *** Pt accompanied by: {accompnied:27141}  PERTINENT HISTORY: per Neuro MD note 03/19/24 - alert and awake. No aphasia or dysarthria. Fund of knowledge is appropriate.  Attention and concentration are normal.   Cranial nerves: Pupils equal, round. Extraocular movements intact with no nystagmus. Visual fields full.  No facial asymmetry.  Motor: Bulk and tone normal, muscle strength 5/5 on left UE and LE, 4/5 proximal right UE and LE with pain on right shoulder abduction. 5/5 distally.  Reflexes +2 throughout.  Finger to nose testing intact.  Gait slow and cautious, no ataxia.      IMPRESSION: This is a 53 yo RH woman with a history of hypertension, lost to follow-up for over a year for seizures. They had previously described shaking episodes triggered when she is upset, raising concern for functional seizures. She reports that when taking Levetiracetam  consistently, she has less seizures. Last seizure was 03/05/24. We discussed repeating 1-hour EEG, and if normal, doing prolonged EEG for characterization. Continue Levetiracetam  1000mg  1/2 tab in  AM, 1 tab in PM for now. She has been having daily headaches for the past 2 months, as well as right-sided weakness since the seizure in April. MRI brain without contrast will be ordered to assess for underlying structural abnormality. She is not sleeping well, we discussed the importance of sleep hygiene for headaches, as well as minimizing over the counter pain medication to 3 a week to avoid rebound headaches. We discussed starting nortriptyline  25mg  at bedtime for headache prophylaxis, side effects discussed. She will be referred for PT for right-sided weakness. She would like to continue working but has been fired due to her seizures, information for the [facility] provided to help with support and avenues for work. She does not drive. Follow-up in 3 months, call for any changes.  PAIN:  Are you having pain? {OPRCPAIN:27236}  PRECAUTIONS: {Therapy precautions:24002}  RED FLAGS: {PT Red Flags:29287}   WEIGHT BEARING RESTRICTIONS: {Yes ***/No:24003}  FALLS: Has patient fallen in last 6 months? {fallsyesno:27318}  LIVING ENVIRONMENT: Lives with: {OPRC lives with:25569::lives with their family} Lives in: {Lives in:25570} Stairs: {opstairs:27293} Has following equipment at home: {Assistive devices:23999}  PLOF: {PLOF:24004}  PATIENT GOALS: ***  OBJECTIVE:  Note: Objective measures were completed at Evaluation  unless otherwise noted.  DIAGNOSTIC FINDINGS: ***  COGNITION: Overall cognitive status: {cognition:24006}   SENSATION: {sensation:27233}  COORDINATION: ***  EDEMA:  {edema:24020}  MUSCLE TONE: {LE tone:25568}  MUSCLE LENGTH: Hamstrings: Right *** deg; Left *** deg Debby test: Right *** deg; Left *** deg  DTRs:  {DTR SITE:24025}  POSTURE: {posture:25561}  LOWER EXTREMITY ROM:     {AROM/PROM:27142}  Right Eval Left Eval  Hip flexion    Hip extension    Hip abduction    Hip adduction    Hip internal rotation    Hip external rotation    Knee flexion    Knee extension    Ankle dorsiflexion    Ankle plantarflexion    Ankle inversion    Ankle eversion     (Blank rows = not tested)  LOWER EXTREMITY MMT:    MMT Right Eval Left Eval  Hip flexion    Hip extension    Hip abduction    Hip adduction    Hip internal rotation    Hip external rotation    Knee flexion    Knee extension    Ankle dorsiflexion    Ankle plantarflexion    Ankle inversion    Ankle eversion    (Blank rows = not tested)  BED MOBILITY:  {bed mobility:32615:p}  TRANSFERS: {transfers eval:32620}  RAMP:  {ramp eval:32616}  CURB:  {curb eval:32617}  STAIRS: {stairs eval:32618} GAIT: Findings: {GaitneuroPT:32644::Distance walked: ***,Comments: ***}  FUNCTIONAL TESTS:  {Functional tests:24029}  PATIENT SURVEYS:  {rehab surveys:24030}                                                                                                                              TREATMENT DATE: ***  PATIENT EDUCATION: Education details: *** Person educated: {Person educated:25204} Education method: {Education Method:25205} Education comprehension: {Education Comprehension:25206}  HOME EXERCISE PROGRAM: ***  GOALS: Goals reviewed with patient? {yes/no:20286}  SHORT TERM GOALS: Target date: ***  *** Baseline: Goal status: INITIAL  2.  *** Baseline:  Goal status:  INITIAL  3.  *** Baseline:  Goal status: INITIAL  4.  *** Baseline:  Goal status: INITIAL  5.  *** Baseline:  Goal status: INITIAL  6.  *** Baseline:  Goal status: INITIAL  LONG TERM GOALS: Target date: ***  *** Baseline:  Goal status: INITIAL  2.  *** Baseline:  Goal status: INITIAL  3.  *** Baseline:  Goal status: INITIAL  4.  *** Baseline:  Goal status: INITIAL  5.  *** Baseline:  Goal status: INITIAL  6.  *** Baseline:  Goal status: INITIAL  ASSESSMENT:  CLINICAL IMPRESSION: Patient is a *** y.o. *** who was seen today for physical therapy evaluation and treatment for ***.   OBJECTIVE IMPAIRMENTS: {opptimpairments:25111}.   ACTIVITY LIMITATIONS: {activitylimitations:27494}  PARTICIPATION LIMITATIONS: {participationrestrictions:25113}  PERSONAL FACTORS: {Personal factors:25162} are also affecting patient's functional outcome.   REHAB POTENTIAL: {rehabpotential:25112}  CLINICAL DECISION MAKING: {clinical decision making:25114}  EVALUATION COMPLEXITY: {Evaluation complexity:25115}  PLAN:  PT FREQUENCY: {rehab frequency:25116}  PT DURATION: {rehab duration:25117}  PLANNED INTERVENTIONS: {rehab planned interventions:25118::97110-Therapeutic exercises,97530- Therapeutic [phone]- Neuromuscular re-education,97535- Self Rjmz,02859- Manual therapy}  PLAN FOR NEXT SESSION: PIERRETTE Lamarr LITTIE Bettina, PT 05/29/2024, 5:45 PM

## 2024-05-30 ENCOUNTER — Ambulatory Visit (HOSPITAL_COMMUNITY): Payer: MEDICAID | Attending: Neurology

## 2024-05-30 ENCOUNTER — Other Ambulatory Visit: Payer: Self-pay

## 2024-05-30 ENCOUNTER — Encounter (HOSPITAL_COMMUNITY): Payer: Self-pay

## 2024-05-30 DIAGNOSIS — M545 Low back pain, unspecified: Secondary | ICD-10-CM | POA: Diagnosis present

## 2024-05-30 DIAGNOSIS — R531 Weakness: Secondary | ICD-10-CM | POA: Insufficient documentation

## 2024-05-30 DIAGNOSIS — R262 Difficulty in walking, not elsewhere classified: Secondary | ICD-10-CM | POA: Diagnosis present

## 2024-05-30 DIAGNOSIS — G8929 Other chronic pain: Secondary | ICD-10-CM | POA: Diagnosis present

## 2024-06-14 ENCOUNTER — Encounter: Payer: Self-pay | Admitting: Neurology

## 2024-06-14 ENCOUNTER — Other Ambulatory Visit: Payer: MEDICAID

## 2024-06-18 ENCOUNTER — Ambulatory Visit (HOSPITAL_COMMUNITY): Payer: MEDICAID | Attending: Neurology | Admitting: Physical Therapy

## 2024-06-18 DIAGNOSIS — R531 Weakness: Secondary | ICD-10-CM | POA: Insufficient documentation

## 2024-06-18 DIAGNOSIS — G8929 Other chronic pain: Secondary | ICD-10-CM | POA: Diagnosis present

## 2024-06-18 DIAGNOSIS — R262 Difficulty in walking, not elsewhere classified: Secondary | ICD-10-CM | POA: Insufficient documentation

## 2024-06-18 DIAGNOSIS — M545 Low back pain, unspecified: Secondary | ICD-10-CM | POA: Diagnosis present

## 2024-06-18 NOTE — Therapy (Signed)
 OUTPATIENT PHYSICAL THERAPY TREATMENT   Patient Name: Katrina Good MRN: [id number] DOB:08-09-1971, 53 y.o., female Today's Date: 06/18/2024   PCP: Catharine Ethelene BIRCH, PA-C REFERRING PROVIDER: Georjean Darice HERO, MD  END OF SESSION:  PT End of Session - 06/18/24 1049     Visit Number 2    Number of Visits 12    Date for PT Re-Evaluation 07/11/24    Authorization Type [employer]    Authorization Time Period 10 visits approved 05/30/24-11/26/24    Authorization - Visit Number 1    Authorization - Number of Visits 10    Progress Note Due on Visit 10    PT Start Time 0903    PT Stop Time 0933    PT Time Calculation (min) 30 min    Activity Tolerance Patient tolerated treatment well;Patient limited by pain    Behavior During Therapy [facility] for tasks assessed/performed           Past Medical History:  Diagnosis Date   Acid reflux    Asthma    Bronchitis    Chronic heel pain, left    Diabetes mellitus    Hypertension    Seizures (HCC)    onset was 08/2020   Substance abuse (HCC)    pt denies   Suicidal thoughts    pt denies   Past Surgical History:  Procedure Laterality Date   ABDOMINAL HYSTERECTOMY     partial   CESAREAN SECTION     x 2   Patient Active Problem List   Diagnosis Date Noted   Seizure disorder (HCC) 06/21/2022   Primary hypertension 06/21/2022   Anxiety 06/21/2022   Substance abuse (HCC) 06/21/2022   Sepsis (HCC) 07/19/2021   Puncture wound of left foot with complication 05/03/2018   Calcaneus fracture, left 05/03/2018   Cocaine abuse (HCC) 05/03/2018   Cellulitis of left foot 05/02/2018   Hypokalemia    Colles' fracture of right radius 03/13/2012    ONSET DATE: ~ a year ago  REFERRING DIAG: R53.1 (ICD-10-CM) - Right sided weakness   THERAPY DIAG:  Weakness of right side of body  Chronic bilateral low back pain, unspecified whether sciatica present  Difficulty in walking, not elsewhere classified  Rationale for Evaluation  and Treatment: Rehabilitation  SUBJECTIVE:                                                                                                                                                                                             SUBJECTIVE STATEMENT: Pt arrived 15 minutes late for session.  Pt reports she has been monitoring her BP at  home and its been as high as 178/189.  States her pain level is lower today at 3/10 and has been doing her HEP which has been helping except the bending forward which increases her pain a lot.    Evaluation: I have had back pain for a long time and it makes my right side weak. Sometimes it is around the back to the hips and  Pt accompanied by: family member Two years ago had a rod in her leg (surgery), wore a boot and was in a w/c a lot and then went septic; was in the hospital for a week in the bed and then once discharged still had to wear the boot. Was having a little pain in the back with the L side but then since then back pain has been getting worse.  Has been having to wear the boot on and off occasionally (R side) secondary to increased pain in that side occasionally *N/T in both hands, elephant on her chest; BP monitored and measured 186/115 HR 79  PERTINENT HISTORY: per Neuro MD note 03/19/24 - Motor: Bulk and tone normal, muscle strength 5/5 on left UE and LE, 4/5 proximal right UE and LE with pain on right shoulder abduction. 5/5 distally. Reflexes +2 throughout.  Finger to nose testing intact.  Gait slow and cautious, no ataxia. IMPRESSION: This is a 53 yo RH woman with a history of hypertension, lost to follow-up for over a year for seizures. They had previously described shaking episodes triggered when she is upset, raising concern for functional seizures. Last seizure was 03/05/24. She has been having daily headaches for the past 2 months, as well as right-sided weakness since the seizure in April. MRI brain without contrast will be ordered to assess  for underlying structural abnormality. She is not sleeping well, we discussed the importance of sleep hygiene for headaches, as well as minimizing over the counter pain medication to 3 a week to avoid rebound headaches. We discussed starting nortriptyline  25mg  at bedtime for headache prophylaxis, side effects discussed. She will be referred for PT for right-sided weakness. She would like to continue working but has been fired due to her seizures, information for the [facility] provided to help with support and avenues for work. She does not drive. Follow-up in 3 months, call for any changes.  PAIN:  Are you having pain? Yes: NPRS scale: 5/10 Pain location: mid back around wrapping to hips Pain description: achy throbbing and sometimes on the L side and has to lean over then the longer she stands the worse Aggravating factors: standing; walking;  Relieving factors: pillow behind her back with sitting (pressure on the mid back - no pain with it)  PRECAUTIONS: Other: Seizures (unknown onset and doesn't know how they present); HTN (sometimes dizziness and cross eyed); NO swimming and stairs and mother in room (no hot sun)  RED FLAGS: Seizures Swelling in BLE, heaviness in chest, fatigue, elevated BP - educated on reaching out to MD regarding symptoms   WEIGHT BEARING RESTRICTIONS: No  FALLS: Has patient fallen in last 6 months? 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 Would love to go to the beach  PATIENT SURVEYS:  Modified Oswestry:  MODIFIED OSWESTRY DISABILITY SCALE  Date:  05/30/24 Score  Total 30/50 - 60%    Interpretation of scores: Score Category Description  0-20% Minimal Disability The patient can cope with most living activities. Usually no treatment is indicated apart from advice on lifting, sitting and exercise  21-40% Moderate Disability The patient experiences more pain and difficulty with sitting, lifting and standing. Travel and social life are more difficult and they may be disabled from work. Personal care, sexual activity and sleeping are not grossly affected, and the patient can usually be managed by conservative means  41-60% Severe Disability Pain remains the main problem in this group, but activities of daily living are affected. These patients require a detailed investigation  61-80% Crippled Back pain impinges on all aspects of the patient's life. Positive intervention is required  81-100% Bed-bound  These patients are either bed-bound or exaggerating their symptoms  Bluford FORBES Zoe DELENA Karon DELENA, et al. Surgery versus conservative management of stable thoracolumbar fracture: the PRESTO feasibility RCT. [place]): [employer]; 2021 Nov. [facility] Technology Assessment, No. 25.62.) Appendix 3, Oswestry Disability Index category descriptors. Available from: FindJewel[employer].cz  Minimally Clinically Important Difference (MCID) = 12.8%  OBJECTIVE:  Note: Objective measures were completed at Evaluation unless otherwise noted.  DIAGNOSTIC FINDINGS:  CLINICAL DATA:  Lower back pain EXAM: LUMBAR SPINE - COMPLETE 4+ VIEW /  IMPRESSION: Degenerative changes. No acute osseous abnormalities.  EXAMINATION: MR BRAIN WO CONTRAST  HISTORY: headaches, Convulsions, right side weakness  TECHNIQUE: MRI of the brain performed without IV contrast. / IMPRESSION: No acute intracranial findings. 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  COGNITION: Overall cognitive status: Within functional limits for tasks  assessed   SENSATION: Distal digits bilaterally in hands light touch/numbness decreased *LE TBA*  EDEMA:  Bilateral LE swelling noted with L > R swelling noted  MUSCLE LENGTH:  Bilateral HS tightness noted in supine  Increased L thoracolumbar tightness/pain during   POSTURE: rounded shoulders, forward head, increased thoracic kyphosis, and flexed trunk   LUMBAR ROM:   AROM eval  Flexion ~40 degree  Extension ~5 degree  Right lateral flexion ~ 15 degree with L TL pain (mostly centralized to T5-T9)  Left lateral flexion ~ 10 degree with L TL pain (mostly centralized to T5-T9)  Right rotation ~20 degree (compensates at hips during standing)  Left rotation ~20 degree (compensates at hips for pain in standing)    (Blank rows = not tested)  LUMBAR SPECIAL TESTS:  Straight leg raise test: Negative and FABER test: L FABER tightness and pain radiating to mid TL spine; R FABER tightness and pain radiating to mid TL spine  LOWER EXTREMITY ROM:     Passive  Right Eval Left Eval  Hip flexion End range limited End range limited 2ary to pain in TL spine  Hip extension    Hip abduction    Hip adduction    Hip internal rotation limited limited  Hip external rotation limited limited  Knee flexion    Knee extension    Ankle dorsiflexion    Ankle plantarflexion    Ankle inversion    Ankle eversion     (Blank rows = not tested) R sided PROM hip limited more than L side  LOWER EXTREMITY MMT:    MMT Right Eval Left Eval  Hip flexion 3- 3+  Hip extension    Hip abduction  Hip adduction    Hip internal rotation    Hip external rotation    Knee flexion 3- 3+  Knee extension 3 4-  Ankle dorsiflexion    Ankle plantarflexion 3 3  Ankle inversion    Ankle eversion    (Blank rows = not tested)  BED MOBILITY:  Findings: Sit to supine Complete Independence and required cues for log roll and increased time secondary to pain Supine to sit Complete Independence  TRANSFERS: Sit  to stand: Complete Independence  Assistive device utilized: bilateral UE needed to stand     Stand to sit: Complete Independence  Assistive device utilized: required posterior reach to chair (rotating to L more than R) and None     Chair to chair: Complete Independence  Assistive device utilized: required UE assist for balance and transition with increased time       GAIT: Findings: Gait Characteristics: antalgic, trunk rotated posterior- Left, and trunk flexed, Distance walked: 74' from treatment room to lobby secondary to pain reported with walking and with standing, and Comments: decreased stability in standing secondary to buckling reports and feeling like she is going to fall with prolonged (>3 min) standing - always has someone with her secondary to seizures/risk for falls  FUNCTIONAL TESTS:  Evaluation: 5 times sit to stand: TBA 30 seconds chair stand test 1 w/ BUE A - held further testing secondary to pain and to reduce stress on heart (high BP)                   06/18/24:  5X STS:  20.83 no UE   :  298 feet no UE, shuffling LE   SLS:  max Rt: 3, Lt 10                                                                                                            TREATMENT DATE:  06/18/24 BP  monitoring:  Initial:  132/98, HR 87 bpm  After 5STS: 134/96 95 bpm  After : 131/94 84 bpm Functional testing:  5X STS:  20.83 no UE    :  298 feet no UE, shuffling LE    SLS:  max Rt: 3, Lt 10 Supine:  lower trunk rotation 10X5 each side  Bridge 10X  SLR 10X each    05/30/24 - EVAL and education - LTR and sleep positioning (knees bent up with pillow under knees) or attempt with lying on L side)   PATIENT EDUCATION: Education details: Patient and mother education on pursuing MD regarding red flag SXS as well as continued gentle mobility for pain management as tolerated Person educated: Patient and Parent Education method: Explanation Education comprehension: verbalized  understanding  HOME EXERCISE PROGRAM: Evaluation: Educated on LTR for evaluation - plan to implement further HEP pending BP management  Access Code: [email or web address] URL: [email or web address] Date: 06/18/2024 Prepared by: Greig Fuse  Exercises - Supine Bridge  - 2 x daily - 7 x weekly - 2 sets - 10 reps - Supine Lower Trunk Rotation  - 2 x  daily - 7 x weekly - 2 sets - 10 reps - 5 sec hold - Supine Active Straight Leg Raise  - 2 x daily - 7 x weekly - 2 sets - 10 reps   GOALS: Goals reviewed with patient? Yes  SHORT TERM GOALS: Target date: 06/20/24  Pt will report independence with compliance with HEP at least 4/7 days of the week performing.  Baseline: prescribed LTR; to be added with TE for management of pain relief in next session Goal status: IN PROGRESS  2.  Pt will report health management communication regarding swelling, heaviness on chest, and continued headache/BP irregularities.  Baseline: *reports of increased swelling Goal status: IN PROGRESS  3.  Pt will report <4/10 pain in mid back with >10 minute standing as needed for progressions with mobility and participation in ADLs. Baseline: Unable to stand > 3 minutes Goal status: IN PROGRESS  LONG TERM GOALS: Target date: 07/15/24  Pt will be able to tolerate standing and walking > 2 hours with < 4/10 pain to indicate improved functional activity tolerance and ADL participation.  Baseline: 5/10 standing > 3 minutes Goal status: IIN PROGRESS  2.  Pt will be compliant with HEP 6/7 days of the week for 2 weeks in order to indicate improved self management of health and progression toward pain management and functional mobility independence.  Baseline: NO HEP  Goal status: IN PROGRESS  3.  Pt will demonstrate FTSTS with improved score of 4.2s (MCD) without use of UE to indicate improved functional BLE strength.  Baseline: 20.83 Goal status: IN PROGRESS  4.  Pt will demonstrate improved functional spinal  mobility with <3/10 pain throughout ROM WFL to improve independence with ADL participation.  Baseline: decreased ROM in all directions with increased pain in lateral flexion/rotation on L side TL spine Goal status: IN PROGRESS  5.  Pt will be able to perform improved with <4/10 pain after performance in order to indicate improved functional independence in gait.  Baseline: 298 ft no AD Goal status: IN PROGRESS  ASSESSMENT:  CLINICAL IMPRESSION: Reviewed goals and POC moving forward.  Pt advised not to continue forward bending if that is painful (states she was instructed to do this at evaluation, however no record of this).  BP was taken and remained in therapeutic range.  Did educate pt to seek medical attention right away if BP ever gets as high as she states it has and especially if bottom number is higher than top.  Pt verbalized understanding of this. Test measures completed this session included 2 minute walk test, single leg stance and 5X sit to stands and recorded in functional test measures.  Established HEP and given written instructions.  Rt SLR was especially challenging and bridge indicated weakness, not pain, completing in limited ROM.  Treatment was limited today due to late arrival.   Pt will continue to benefit from skilled PT interventions to address functional mobility safety and deficits impacting current QOL and independence.   OBJECTIVE IMPAIRMENTS: Abnormal gait, decreased activity tolerance, decreased balance, decreased endurance, decreased mobility, difficulty walking, decreased ROM, decreased strength, decreased safety awareness, impaired flexibility, postural dysfunction, and pain.   ACTIVITY LIMITATIONS: carrying, lifting, bending, standing, squatting, sleeping, stairs, transfers, bed mobility, reach over head, hygiene/grooming, and locomotion level  PARTICIPATION LIMITATIONS: shopping, community activity, and occupation  PERSONAL FACTORS: 1-2 comorbidities: HTN ,  diabetes, hx of  are also affecting patient's functional outcome.   REHAB POTENTIAL: Fair    CLINICAL DECISION MAKING:  Evolving/moderate complexity  EVALUATION COMPLEXITY: Moderate  PLAN:  PT FREQUENCY: 2x/week  PT DURATION: 6 weeks  PLANNED INTERVENTIONS: 97164- PT Re-evaluation, 97110-Therapeutic exercises, 97530- Therapeutic activity, [id number]- Neuromuscular re-education, 97535- Self Care, [id number]- Manual therapy, [phone]- Gait training, [phone]- Electrical stimulation (unattended), [phone]- Ultrasound, Patient/Family education, Balance training, and Stair training  PLAN FOR NEXT SESSION: monitor BP at beginning of session and ask about contact to MD regarding red flag symptoms; if medically appropriate. Next session complete balance assessment.     Vivian No B, PTA 06/18/2024, 10:52 AM

## 2024-06-20 ENCOUNTER — Encounter (HOSPITAL_COMMUNITY): Payer: MEDICAID | Admitting: Physical Therapy

## 2024-06-21 ENCOUNTER — Encounter (HOSPITAL_COMMUNITY): Payer: Self-pay

## 2024-06-21 ENCOUNTER — Ambulatory Visit (HOSPITAL_COMMUNITY): Payer: Self-pay | Admitting: Occupational Therapy

## 2024-06-21 ENCOUNTER — Ambulatory Visit (HOSPITAL_COMMUNITY): Payer: MEDICAID

## 2024-06-21 DIAGNOSIS — R531 Weakness: Secondary | ICD-10-CM

## 2024-06-21 DIAGNOSIS — R262 Difficulty in walking, not elsewhere classified: Secondary | ICD-10-CM

## 2024-06-21 DIAGNOSIS — M545 Low back pain, unspecified: Secondary | ICD-10-CM

## 2024-06-21 NOTE — Therapy (Signed)
 OUTPATIENT PHYSICAL THERAPY TREATMENT   Patient Name: Katrina Good MRN: [id number] DOB:[birth year], 53 y.o., female Today's Date: 06/21/2024   PCP: Catharine Ethelene BIRCH, PA-C REFERRING PROVIDER: Georjean Darice HERO, MD Next PCP apt: 06/27/24  END OF SESSION:  PT End of Session - 06/21/24 1305     Visit Number 3    Number of Visits 12    Date for PT Re-Evaluation 07/11/24    Authorization Type [employer]    Authorization Time Period 10 visits approved 05/30/24-11/26/24    Authorization - Visit Number 2    Authorization - Number of Visits 10    Progress Note Due on Visit 10    PT Start Time 1306    PT Stop Time 1344    PT Time Calculation (min) 38 min    Activity Tolerance Treatment limited secondary to medical complications (Comment)   seizure   Behavior During Therapy [facility] for tasks assessed/performed           Past Medical History:  Diagnosis Date   Acid reflux    Asthma    Bronchitis    Chronic heel pain, left    Diabetes mellitus    Hypertension    Seizures (HCC)    onset was 08/2020   Substance abuse (HCC)    pt denies   Suicidal thoughts    pt denies   Past Surgical History:  Procedure Laterality Date   ABDOMINAL HYSTERECTOMY     partial   CESAREAN SECTION     x 2   Patient Active Problem List   Diagnosis Date Noted   Seizure disorder (HCC) 06/21/2022   Primary hypertension 06/21/2022   Anxiety 06/21/2022   Substance abuse (HCC) 06/21/2022   Sepsis (HCC) 07/19/2021   Puncture wound of left foot with complication 05/03/2018   Calcaneus fracture, left 05/03/2018   Cocaine abuse (HCC) 05/03/2018   Cellulitis of left foot 05/02/2018   Hypokalemia    Colles' fracture of right radius 03/13/2012    ONSET DATE: ~ a year ago  REFERRING DIAG: R53.1 (ICD-10-CM) - Right sided weakness   THERAPY DIAG:  Weakness of right side of body  Chronic bilateral low back pain, unspecified whether sciatica present  Difficulty in walking, not elsewhere  classified  Rationale for Evaluation and Treatment: Rehabilitation  SUBJECTIVE:                                                                                                                                                                                             SUBJECTIVE STATEMENT: Pt arrived 15 minutes late for session.  Pt reports she  has been monitoring her BP at home and its been as high as 178/189.  States her pain level is lower today at 3/10 and has been doing her HEP which has been helping except the bending forward which increases her pain a lot.    Pt stated she walked here from home, took her 10 minutes.  Current pain scale 5/10  Evaluation: I have had back pain for a long time and it makes my right side weak. Sometimes it is around the back to the hips and  Pt accompanied by: family member Two years ago had a rod in her leg (surgery), wore a boot and was in a w/c a lot and then went septic; was in the hospital for a week in the bed and then once discharged still had to wear the boot. Was having a little pain in the back with the L side but then since then back pain has been getting worse.  Has been having to wear the boot on and off occasionally (R side) secondary to increased pain in that side occasionally *N/T in both hands, elephant on her chest; BP monitored and measured 186/115 HR 79  PERTINENT HISTORY: per Neuro MD note 03/19/24 - Motor: Bulk and tone normal, muscle strength 5/5 on left UE and LE, 4/5 proximal right UE and LE with pain on right shoulder abduction. 5/5 distally. Reflexes +2 throughout.  Finger to nose testing intact.  Gait slow and cautious, no ataxia. IMPRESSION: This is a 53 yo RH woman with a history of hypertension, lost to follow-up for over a year for seizures. They had previously described shaking episodes triggered when she is upset, raising concern for functional seizures. Last seizure was 03/05/24. She has been having daily headaches for the past  2 months, as well as right-sided weakness since the seizure in April. MRI brain without contrast will be ordered to assess for underlying structural abnormality. She is not sleeping well, we discussed the importance of sleep hygiene for headaches, as well as minimizing over the counter pain medication to 3 a week to avoid rebound headaches. We discussed starting nortriptyline  25mg  at bedtime for headache prophylaxis, side effects discussed. She will be referred for PT for right-sided weakness. She would like to continue working but has been fired due to her seizures, information for the [employer] [place] provided to help with support and avenues for work. She does not drive. Follow-up in 3 months, call for any changes.  PAIN:  Are you having pain? Yes: NPRS scale: 5/10 Pain location: mid back around wrapping to hips Pain description: achy throbbing and sometimes on the L side and has to lean over then the longer she stands the worse Aggravating factors: standing; walking;  Relieving factors: pillow behind her back with sitting (pressure on the mid back - no pain with it)  PRECAUTIONS: Other: Seizures (unknown onset and doesn't know how they present); HTN (sometimes dizziness and cross eyed); NO swimming and stairs and mother in room (no hot sun)  RED FLAGS: Seizures Swelling in BLE, heaviness in chest, fatigue, elevated BP - educated on reaching out to MD regarding symptoms   WEIGHT BEARING RESTRICTIONS: No  FALLS: Has patient fallen in last 6 months? 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  Would love to go to the beach  PATIENT SURVEYS:  Modified Oswestry:  MODIFIED OSWESTRY DISABILITY SCALE  Date: 05/30/24 Score  Total 30/50 - 60%    Interpretation of scores: Score Category Description  0-20% Minimal Disability The patient can cope with most living activities. Usually no treatment is indicated apart from advice on lifting, sitting and exercise  21-40% Moderate Disability The patient experiences more pain and difficulty with sitting, lifting and standing. Travel and social life are more difficult and they may be disabled from work. Personal care, sexual activity and sleeping are not grossly affected, and the patient can usually be managed by conservative means  41-60% Severe Disability Pain remains the main problem in this group, but activities of daily living are affected. These patients require a detailed investigation  61-80% Crippled Back pain impinges on all aspects of the patient's life. Positive intervention is required  81-100% Bed-bound  These patients are either bed-bound or exaggerating their symptoms  Bluford FORBES Zoe DELENA Karon DELENA, et al. Surgery versus conservative management of stable thoracolumbar fracture: the PRESTO feasibility RCT. [place] ([place]): [employer]; 2021 Nov. [facility] Technology Assessment, No. 25.62.) Appendix 3, Oswestry Disability Index category descriptors. Available from: FindJewel[employer].cz  Minimally Clinically Important Difference (MCID) = 12.8%  OBJECTIVE:  Note: Objective measures were completed at Evaluation unless otherwise noted.  DIAGNOSTIC FINDINGS:  CLINICAL DATA:  Lower back pain EXAM: LUMBAR SPINE - COMPLETE 4+ VIEW /  IMPRESSION: Degenerative changes. No acute osseous abnormalities.  EXAMINATION: MR BRAIN WO CONTRAST  HISTORY: headaches, Convulsions, right side weakness  TECHNIQUE: MRI of the brain performed without IV contrast. / IMPRESSION: No acute intracranial findings. 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  COGNITION: Overall cognitive status: Within functional limits for tasks assessed   SENSATION: Distal digits bilaterally in hands light touch/numbness decreased *LE TBA*  EDEMA:  Bilateral LE swelling noted with L > R swelling noted  MUSCLE LENGTH:  Bilateral HS tightness noted in supine  Increased L thoracolumbar tightness/pain during   POSTURE: rounded shoulders, forward head, increased thoracic kyphosis, and flexed trunk   LUMBAR ROM:   AROM eval  Flexion ~40 degree  Extension ~5 degree  Right lateral flexion ~ 15 degree with L TL pain (mostly centralized to T5-T9)  Left lateral flexion ~ 10 degree with L TL pain (mostly centralized to T5-T9)  Right rotation ~20 degree (compensates at hips during standing)  Left rotation ~20 degree (compensates at hips for pain in standing)    (Blank rows = not tested)  LUMBAR SPECIAL TESTS:  Straight leg raise test: Negative and FABER test: L FABER tightness and pain radiating to mid TL spine; R FABER tightness and pain radiating to mid TL spine  LOWER EXTREMITY ROM:     Passive  Right Eval Left Eval  Hip flexion End range limited End range limited 2ary to pain in TL spine  Hip extension    Hip abduction    Hip adduction    Hip internal rotation limited limited  Hip external rotation limited limited  Knee flexion    Knee extension    Ankle dorsiflexion    Ankle plantarflexion    Ankle inversion    Ankle eversion     (Blank rows = not tested) R sided PROM hip limited more than L side  LOWER EXTREMITY MMT:  MMT Right Eval Left Eval  Hip flexion 3- 3+  Hip extension    Hip abduction    Hip adduction    Hip internal rotation    Hip external rotation    Knee flexion 3- 3+  Knee extension 3 4-  Ankle dorsiflexion    Ankle plantarflexion 3 3  Ankle inversion    Ankle eversion    (Blank rows = not tested)  BED MOBILITY:  Findings: Sit to supine Complete Independence and required  cues for log roll and increased time secondary to pain Supine to sit Complete Independence  TRANSFERS: Sit to stand: Complete Independence  Assistive device utilized: bilateral UE needed to stand     Stand to sit: Complete Independence  Assistive device utilized: required posterior reach to chair (rotating to L more than R) and None     Chair to chair: Complete Independence  Assistive device utilized: required UE assist for balance and transition with increased time       GAIT: Findings: Gait Characteristics: antalgic, trunk rotated posterior- Left, and trunk flexed, Distance walked: 71' from treatment room to lobby secondary to pain reported with walking and with standing, and Comments: decreased stability in standing secondary to buckling reports and feeling like she is going to fall with prolonged (>3 min) standing - always has someone with her secondary to seizures/risk for falls  FUNCTIONAL TESTS:  Evaluation: 5 times sit to stand: TBA 30 seconds chair stand test 1 w/ BUE A - held further testing secondary to pain and to reduce stress on heart (high BP)                   06/18/24:  5X STS:  20.83 no UE   :  298 feet no UE, shuffling LE   SLS:  max Rt: 3, Lt 10                                                                                                            TREATMENT DATE:  06/21/24  BP in seated position, Lt arm: 123/82 mmHg HR 95 bmp BERG  1. SITTING TO STANDING  4 able to stand without using hands and stabilize independently  2. STANDING UNSUPPORTED  4 able to stand safely for 2 minutes  If a subject is able to stand 2 minutes unsupported, score full points for sitting unsupported. Proceed to item #4  3. SITTING WITH BACK UNSUPPORTED BUT FEET SUPPORTED ON FLOOR OR ON A STOOL 4 able to sit safely and securely for 2 minutes  4. STANDING TO SITTING 4 sits safely with minimal use of hands  5. TRANSFERS 4 able to transfer safely with minor use of hands  6.  STANDING UNSUPPORTED WITH EYES CLOSED 4 able to stand 10 seconds safely   7. STANDING UNSUPPORTED WITH FEET TOGETHER  3 able to place feet together independently and stand 1 minute with supervision  8. REACHING FORWARD WITH OUTSTRETCHED ARM WHILE STANDING 4 can reach forward confidently 25 cm (10 inches)  9. PICK UP OBJECT  FROM THE FLOOR FROM A STANDING POSITION 2 unable to pick up but reaches 2-5 cm(1-2 inches) from slipper and keeps balance independently  10. TURNING TO LOOK BEHIND OVER LEFT AND RIGHT SHOULDERS WHILE STANDING  4 looks behind from both sides and weight shifts well  11. TURN 360 DEGREES 2 able to turn 360 degrees safely but slowly 6 seconds 12. PLACE ALTERNATE FOOT ON STEP OR STOOL WHILE STANDING UNSUPPORTED  1 able to complete > 2 steps needs minimal assist 1 able to complete > 2 steps needs minimal assist  13. STANDING UNSUPPORTED ONE FOOT IN FRONT 1 needs help to step but can hold 15 seconds  14. STANDING ON ONE LEG 3 able to lift leg independently and hold 5-10 seconds   TOTAL SCORE  44/56 41-56 = independent    Seizure like s/s monitored on mat BP 145/101 mmHg; HR 96 Seizure for 5 minutes- monitored  Pt laid sidelying with min A for LE, fan on her and wet washcloth Monitored for safety Left in wheelchair as feels unstable upon standing.     06/18/24 BP  monitoring:  Initial:  132/98, HR 87 bpm  After 5STS: 134/96 95 bpm  After : 131/94 84 bpm Functional testing:  5X STS:  20.83 no UE    :  298 feet no UE, shuffling LE    SLS:  max Rt: 3, Lt 10 Supine:  lower trunk rotation 10X5 each side  Bridge 10X  SLR 10X each    05/30/24 - EVAL and education - LTR and sleep positioning (knees bent up with pillow under knees) or attempt with lying on L side)   PATIENT EDUCATION: Education details: Patient and mother education on pursuing MD regarding red flag SXS as well as continued gentle mobility for pain management as tolerated Person  educated: Patient and Parent Education method: Explanation Education comprehension: verbalized understanding  HOME EXERCISE PROGRAM: Evaluation: Educated on LTR for evaluation - plan to implement further HEP pending BP management  Access Code: [email or web address] URL: [email or web address] Date: 06/18/2024 Prepared by: Greig Fuse  Exercises - Supine Bridge  - 2 x daily - 7 x weekly - 2 sets - 10 reps - Supine Lower Trunk Rotation  - 2 x daily - 7 x weekly - 2 sets - 10 reps - 5 sec hold - Supine Active Straight Leg Raise  - 2 x daily - 7 x weekly - 2 sets - 10 reps   GOALS: Goals reviewed with patient? Yes  SHORT TERM GOALS: Target date: 06/20/24  Pt will report independence with compliance with HEP at least 4/7 days of the week performing.  Baseline: prescribed LTR; to be added with TE for management of pain relief in next session Goal status: IN PROGRESS  2.  Pt will report health management communication regarding swelling, heaviness on chest, and continued headache/BP irregularities.  Baseline: *reports of increased swelling Goal status: IN PROGRESS  3.  Pt will report <4/10 pain in mid back with >10 minute standing as needed for progressions with mobility and participation in ADLs. Baseline: Unable to stand > 3 minutes Goal status: IN PROGRESS  LONG TERM GOALS: Target date: 07/15/24  Pt will be able to tolerate standing and walking > 2 hours with < 4/10 pain to indicate improved functional activity tolerance and ADL participation.  Baseline: 5/10 standing > 3 minutes Goal status: IIN PROGRESS  2.  Pt will be compliant with HEP 6/7 days of the week for 2 weeks in order to  indicate improved self management of health and progression toward pain management and functional mobility independence.  Baseline: NO HEP  Goal status: IN PROGRESS  3.  Pt will demonstrate FTSTS with improved score of 4.2s (MCD) without use of UE to indicate improved functional BLE strength.   Baseline: 20.83 Goal status: IN PROGRESS  4.  Pt will demonstrate improved functional spinal mobility with <3/10 pain throughout ROM WFL to improve independence with ADL participation.  Baseline: decreased ROM in all directions with increased pain in lateral flexion/rotation on L side TL spine Goal status: IN PROGRESS  5.  Pt will be able to perform improved with <4/10 pain after performance in order to indicate improved functional independence in gait.  Baseline: 298 ft no AD Goal status: IN PROGRESS  ASSESSMENT:  CLINICAL IMPRESSION: BP taken through session.  Started off with good BP at 123/82 mmHg.  BERG complete to assess balance.  Pt stated she walked to rehab today, around 10 minutes from home.  Reports seizures 2-3 times a day triggered by heat.  Pt with seizure like s/s than turned into seizure.  Pt assisted to sidelying, fan on her and wet washcloth, monitored for safety.  Due to seizure BERG was not complete this session.  Supervisor assisted in transportation and encouraged pt to find other ways of transportation as heat triggers the seizures for safety.  Reviewed goals and POC moving forward.  Pt advised not to continue forward bending if that is painful (states she was instructed to do this at evaluation, however no record of this).  BP was taken and remained in therapeutic range.  Did educate pt to seek medical attention right away if BP ever gets as high as she states it has and especially if bottom number is higher than top.  Pt verbalized understanding of this. Test measures completed this session included 2 minute walk test, single leg stance and 5X sit to stands and recorded in functional test measures.  Established HEP and given written instructions.  Rt SLR was especially challenging and bridge indicated weakness, not pain, completing in limited ROM.  Treatment was limited today due to late arrival.   Pt will continue to benefit from skilled PT interventions to address  functional mobility safety and deficits impacting current QOL and independence.   OBJECTIVE IMPAIRMENTS: Abnormal gait, decreased activity tolerance, decreased balance, decreased endurance, decreased mobility, difficulty walking, decreased ROM, decreased strength, decreased safety awareness, impaired flexibility, postural dysfunction, and pain.   ACTIVITY LIMITATIONS: carrying, lifting, bending, standing, squatting, sleeping, stairs, transfers, bed mobility, reach over head, hygiene/grooming, and locomotion level  PARTICIPATION LIMITATIONS: shopping, community activity, and occupation  PERSONAL FACTORS: 1-2 comorbidities: HTN , diabetes, hx of  are also affecting patient's functional outcome.   REHAB POTENTIAL: Fair    CLINICAL DECISION MAKING: Evolving/moderate complexity  EVALUATION COMPLEXITY: Moderate  PLAN:  PT FREQUENCY: 2x/week  PT DURATION: 6 weeks  PLANNED INTERVENTIONS: 97164- PT Re-evaluation, 97110-Therapeutic exercises, 97530- Therapeutic activity, [id number]- Neuromuscular re-education, 97535- Self Care, [id number]- Manual therapy, [phone]- Gait training, [phone]- Electrical stimulation (unattended), [phone]- Ultrasound, Patient/Family education, Balance training, and Stair training  PLAN FOR NEXT SESSION: monitor BP at beginning of session and ask about contact to MD regarding red flag symptoms; if medically appropriate.   Katrina Good, LPTA/CLT; CBIS [phone]  Katrina Good, PTA 06/21/2024, 4:05 PM

## 2024-06-26 ENCOUNTER — Ambulatory Visit (HOSPITAL_COMMUNITY): Payer: MEDICAID | Admitting: Physical Therapy

## 2024-06-26 NOTE — Transportation (Signed)
 Pt attending PT session, had seizure in clinic due to walking to clinic in the heat. Supervisor assisted pt with transportation needs and provided taxi voucher. Documentation completed.    Sonny Cory, OTR/L  [phone] 06/26/24

## 2024-06-27 ENCOUNTER — Ambulatory Visit (HOSPITAL_COMMUNITY): Payer: MEDICAID

## 2024-06-27 ENCOUNTER — Encounter (HOSPITAL_COMMUNITY): Payer: Self-pay

## 2024-06-27 DIAGNOSIS — R531 Weakness: Secondary | ICD-10-CM | POA: Diagnosis not present

## 2024-06-27 DIAGNOSIS — G8929 Other chronic pain: Secondary | ICD-10-CM

## 2024-06-27 DIAGNOSIS — R262 Difficulty in walking, not elsewhere classified: Secondary | ICD-10-CM

## 2024-06-27 NOTE — Therapy (Addendum)
 OUTPATIENT PHYSICAL THERAPY TREATMENT   Patient Name: Katrina Good MRN: [id number] DOB:08-13-1971, 53 y.o., female Today's Date: 06/27/2024   PCP: Catharine Ethelene BIRCH, PA-C REFERRING PROVIDER: Georjean Darice HERO, MD Next PCP apt: 06/27/24  END OF SESSION:  PT End of Session - 06/27/24 0931     Visit Number 4    Number of Visits 12    Date for PT Re-Evaluation 07/11/24    Authorization Type [employer]    Authorization Time Period 10 visits approved 05/30/24-11/26/24    Authorization - Visit Number 3    Authorization - Number of Visits 10    Progress Note Due on Visit 10    PT Start Time 0932    PT Stop Time 1013    PT Time Calculation (min) 41 min    Behavior During Therapy [facility] for tasks assessed/performed           Past Medical History:  Diagnosis Date   Acid reflux    Asthma    Bronchitis    Chronic heel pain, left    Diabetes mellitus    Hypertension    Seizures (HCC)    onset was 08/2020   Substance abuse (HCC)    pt denies   Suicidal thoughts    pt denies   Past Surgical History:  Procedure Laterality Date   ABDOMINAL HYSTERECTOMY     partial   CESAREAN SECTION     x 2   Patient Active Problem List   Diagnosis Date Noted   Seizure disorder (HCC) 06/21/2022   Primary hypertension 06/21/2022   Anxiety 06/21/2022   Substance abuse (HCC) 06/21/2022   Sepsis (HCC) 07/19/2021   Puncture wound of left foot with complication 05/03/2018   Calcaneus fracture, left 05/03/2018   Cocaine abuse (HCC) 05/03/2018   Cellulitis of left foot 05/02/2018   Hypokalemia    Colles' fracture of right radius 03/13/2012    ONSET DATE: ~ a year ago  REFERRING DIAG: R53.1 (ICD-10-CM) - Right sided weakness   THERAPY DIAG:  Weakness of right side of body  Chronic bilateral low back pain, unspecified whether sciatica present  Difficulty in walking, not elsewhere classified  Rationale for Evaluation and Treatment: Rehabilitation  SUBJECTIVE:                                                                                                                                                                                              SUBJECTIVE STATEMENT: Pt reports 5/10 pain today in mid back. Reports son drove her to session today instead of walking. Reports she feels much better than she  did when she arrived for last session. Reports been doing her HEP. BP taken at start of session 181/102. Pt reports she hadn't taken her BP meds today yet. Reports she takes 2. But sometimes even after taking meds her BP is still high. Reports her MD is aware.    Evaluation: I have had back pain for a long time and it makes my right side weak. Sometimes it is around the back to the hips and  Pt accompanied by: family member Two years ago had a rod in her leg (surgery), wore a boot and was in a w/c a lot and then went septic; was in the hospital for a week in the bed and then once discharged still had to wear the boot. Was having a little pain in the back with the L side but then since then back pain has been getting worse.  Has been having to wear the boot on and off occasionally (R side) secondary to increased pain in that side occasionally *N/T in both hands, elephant on her chest; BP monitored and measured 186/115 HR 79  PERTINENT HISTORY: per Neuro MD note 03/19/24 - Motor: Bulk and tone normal, muscle strength 5/5 on left UE and LE, 4/5 proximal right UE and LE with pain on right shoulder abduction. 5/5 distally. Reflexes +2 throughout.  Finger to nose testing intact.  Gait slow and cautious, no ataxia. IMPRESSION: This is a 53 yo RH woman with a history of hypertension, lost to follow-up for over a year for seizures. They had previously described shaking episodes triggered when she is upset, raising concern for functional seizures. Last seizure was 03/05/24. She has been having daily headaches for the past 2 months, as well as right-sided weakness since the seizure in  April. MRI brain without contrast will be ordered to assess for underlying structural abnormality. She is not sleeping well, we discussed the importance of sleep hygiene for headaches, as well as minimizing over the counter pain medication to 3 a week to avoid rebound headaches. We discussed starting nortriptyline  25mg  at bedtime for headache prophylaxis, side effects discussed. She will be referred for PT for right-sided weakness. She would like to continue working but has been fired due to her seizures, information for the [employer] [place] provided to help with support and avenues for work. She does not drive. Follow-up in 3 months, call for any changes.  PAIN:  Are you having pain? Yes: NPRS scale: 5/10 Pain location: mid back around wrapping to hips Pain description: achy throbbing and sometimes on the L side and has to lean over then the longer she stands the worse Aggravating factors: standing; walking;  Relieving factors: pillow behind her back with sitting (pressure on the mid back - no pain with it)  PRECAUTIONS: Other: Seizures (unknown onset and doesn't know how they present); HTN (sometimes dizziness and cross eyed); NO swimming and stairs and mother in room (no hot sun)  RED FLAGS: Seizures Swelling in BLE, heaviness in chest, fatigue, elevated BP - educated on reaching out to MD regarding symptoms   WEIGHT BEARING RESTRICTIONS: No  FALLS: Has patient fallen in last 6 months? 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 Would love to go to the beach  PATIENT SURVEYS:  Modified  Oswestry:  MODIFIED OSWESTRY DISABILITY SCALE  Date: 05/30/24 Score  Total 30/50 - 60%    Interpretation of scores: Score Category Description  0-20% Minimal Disability The patient can cope with most living activities. Usually no treatment is indicated apart from advice on lifting, sitting and exercise  21-40% Moderate Disability The patient experiences more pain and difficulty with sitting, lifting and standing. Travel and social life are more difficult and they may be disabled from work. Personal care, sexual activity and sleeping are not grossly affected, and the patient can usually be managed by conservative means  41-60% Severe Disability Pain remains the main problem in this group, but activities of daily living are affected. These patients require a detailed investigation  61-80% Crippled Back pain impinges on all aspects of the patient's life. Positive intervention is required  81-100% Bed-bound  These patients are either bed-bound or exaggerating their symptoms  Bluford FORBES Zoe DELENA Karon DELENA, et al. Surgery versus conservative management of stable thoracolumbar fracture: the PRESTO feasibility RCT. [place] ([place]): [employer]; 2021 [address] [facility] [facility] Technology Assessment, No. 25.62.) Appendix 3, Oswestry Disability Index category descriptors. Available from: FindJewel[employer].cz  Minimally Clinically Important Difference (MCID) = 12.8%  OBJECTIVE:  Note: Objective measures were completed at Evaluation unless otherwise noted.  DIAGNOSTIC FINDINGS:  CLINICAL DATA:  Lower back pain EXAM: LUMBAR SPINE - COMPLETE 4+ VIEW /  IMPRESSION: Degenerative changes. No acute osseous abnormalities.  EXAMINATION: MR BRAIN WO CONTRAST  HISTORY: headaches, Convulsions, right side weakness  TECHNIQUE: MRI of the brain performed without IV contrast. / IMPRESSION: No acute intracranial findings. 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  COGNITION: Overall  cognitive status: Within functional limits for tasks assessed   SENSATION: Distal digits bilaterally in hands light touch/numbness decreased *LE TBA*  EDEMA:  Bilateral LE swelling noted with L > R swelling noted  MUSCLE LENGTH:  Bilateral HS tightness noted in supine  Increased L thoracolumbar tightness/pain during   POSTURE: rounded shoulders, forward head, increased thoracic kyphosis, and flexed trunk   LUMBAR ROM:   AROM eval  Flexion ~40 degree  Extension ~5 degree  Right lateral flexion ~ 15 degree with L TL pain (mostly centralized to T5-T9)  Left lateral flexion ~ 10 degree with L TL pain (mostly centralized to T5-T9)  Right rotation ~20 degree (compensates at hips during standing)  Left rotation ~20 degree (compensates at hips for pain in standing)    (Blank rows = not tested)  LUMBAR SPECIAL TESTS:  Straight leg raise test: Negative and FABER test: L FABER tightness and pain radiating to mid TL spine; R FABER tightness and pain radiating to mid TL spine  LOWER EXTREMITY ROM:     Passive  Right Eval Left Eval  Hip flexion End range limited End range limited 2ary to pain in TL spine  Hip extension    Hip abduction    Hip adduction    Hip internal rotation limited limited  Hip external rotation limited limited  Knee flexion    Knee extension    Ankle dorsiflexion    Ankle plantarflexion    Ankle inversion    Ankle eversion     (Blank rows = not tested) R sided PROM hip limited more than L side  LOWER EXTREMITY MMT:    MMT Right Eval Left Eval  Hip flexion 3-  3+  Hip extension    Hip abduction    Hip adduction    Hip internal rotation    Hip external rotation    Knee flexion 3- 3+  Knee extension 3 4-  Ankle dorsiflexion    Ankle plantarflexion 3 3  Ankle inversion    Ankle eversion    (Blank rows = not tested)  BED MOBILITY:  Findings: Sit to supine Complete Independence and required cues for log roll and increased time secondary to  pain Supine to sit Complete Independence  TRANSFERS: Sit to stand: Complete Independence  Assistive device utilized: bilateral UE needed to stand     Stand to sit: Complete Independence  Assistive device utilized: required posterior reach to chair (rotating to L more than R) and None     Chair to chair: Complete Independence  Assistive device utilized: required UE assist for balance and transition with increased time       GAIT: Findings: Gait Characteristics: antalgic, trunk rotated posterior- Left, and trunk flexed, Distance walked: 72' from treatment room to lobby secondary to pain reported with walking and with standing, and Comments: decreased stability in standing secondary to buckling reports and feeling like she is going to fall with prolonged (>3 min) standing - always has someone with her secondary to seizures/risk for falls  FUNCTIONAL TESTS:  Evaluation: 5 times sit to stand: TBA 30 seconds chair stand test 1 w/ BUE A - held further testing secondary to pain and to reduce stress on heart (high BP)                   06/18/24:  5X STS:  20.83 no UE   :  298 feet no UE, shuffling LE   SLS:  max Rt: 3, Lt 10                                                                                                            TREATMENT DATE:  06/27/24: LTR, 10x each side  BP: taken 181/102, RUE, in supine, no symptoms TA contraction + march, 10x Bridge, 10x  +staggered stance, RLE posterior, 10x Seated LAQ RLE, 10x  10x w/ 5 Lb AW Seated March RLE, 10x Standing hip abduction, 10x2 Standing Hip extension, 10x2 Seated Forward lumbar flexion with physioball, 5 holds, 5x   06/21/24  BP in seated position, Lt arm: 123/82 mmHg HR 95 bmp BERG  1. SITTING TO STANDING  4 able to stand without using hands and stabilize independently  2. STANDING UNSUPPORTED  4 able to stand safely for 2 minutes  If a subject is able to stand 2 minutes unsupported, score full points for sitting  unsupported. Proceed to item #4  3. SITTING WITH BACK UNSUPPORTED BUT FEET SUPPORTED ON FLOOR OR ON A STOOL 4 able to sit safely and securely for 2 minutes  4. STANDING TO SITTING 4 sits safely with minimal use of hands  5. TRANSFERS 4 able to transfer safely with minor use of hands  6. STANDING UNSUPPORTED WITH EYES CLOSED 4  able to stand 10 seconds safely   7. STANDING UNSUPPORTED WITH FEET TOGETHER  3 able to place feet together independently and stand 1 minute with supervision  8. REACHING FORWARD WITH OUTSTRETCHED ARM WHILE STANDING 4 can reach forward confidently 25 cm (10 inches)  9. PICK UP OBJECT FROM THE FLOOR FROM A STANDING POSITION 2 unable to pick up but reaches 2-5 cm(1-2 inches) from slipper and keeps balance independently  10. TURNING TO LOOK BEHIND OVER LEFT AND RIGHT SHOULDERS WHILE STANDING  4 looks behind from both sides and weight shifts well  11. TURN 360 DEGREES 2 able to turn 360 degrees safely but slowly 6 seconds 12. PLACE ALTERNATE FOOT ON STEP OR STOOL WHILE STANDING UNSUPPORTED  1 able to complete > 2 steps needs minimal assist 1 able to complete > 2 steps needs minimal assist  13. STANDING UNSUPPORTED ONE FOOT IN FRONT 1 needs help to step but can hold 15 seconds  14. STANDING ON ONE LEG 3 able to lift leg independently and hold 5-10 seconds   TOTAL SCORE  44/56 41-56 = independent    Seizure like s/s monitored on mat BP 145/101 mmHg; HR 96 Seizure for 5 minutes- monitored  Pt laid sidelying with min A for LE, fan on her and wet washcloth Monitored for safety Left in wheelchair as feels unstable upon standing.     06/18/24 BP  monitoring:  Initial:  132/98, HR 87 bpm  After 5STS: 134/96 95 bpm  After : 131/94 84 bpm Functional testing:  5X STS:  20.83 no UE    :  298 feet no UE, shuffling LE    SLS:  max Rt: 3, Lt 10 Supine:  lower trunk rotation 10X5 each side  Bridge 10X  SLR 10X each    PATIENT  EDUCATION: Education details: Patient and mother education on pursuing MD regarding red flag SXS as well as continued gentle mobility for pain management as tolerated Person educated: Patient and Parent Education method: Explanation Education comprehension: verbalized understanding  HOME EXERCISE PROGRAM: Evaluation: Educated on LTR for evaluation - plan to implement further HEP pending BP management  Access Code: [email or web address] URL: [email or web address] Date: 06/18/2024 Prepared by: Greig Fuse  Exercises - Supine Bridge  - 2 x daily - 7 x weekly - 2 sets - 10 reps - Supine Lower Trunk Rotation  - 2 x daily - 7 x weekly - 2 sets - 10 reps - 5 sec hold - Supine Active Straight Leg Raise  - 2 x daily - 7 x weekly - 2 sets - 10 reps   GOALS: Goals reviewed with patient? Yes  SHORT TERM GOALS: Target date: 06/20/24  Pt will report independence with compliance with HEP at least 4/7 days of the week performing.  Baseline: prescribed LTR; to be added with TE for management of pain relief in next session Goal status: IN PROGRESS  2.  Pt will report health management communication regarding swelling, heaviness on chest, and continued headache/BP irregularities.  Baseline: *reports of increased swelling Goal status: IN PROGRESS  3.  Pt will report <4/10 pain in mid back with >10 minute standing as needed for progressions with mobility and participation in ADLs. Baseline: Unable to stand > 3 minutes Goal status: IN PROGRESS  LONG TERM GOALS: Target date: 07/15/24  Pt will be able to tolerate standing and walking > 2 hours with < 4/10 pain to indicate improved functional activity tolerance and ADL participation.  Baseline: 5/10 standing > 3  minutes Goal status: IIN PROGRESS  2.  Pt will be compliant with HEP 6/7 days of the week for 2 weeks in order to indicate improved self management of health and progression toward pain management and functional mobility independence.   Baseline: NO HEP  Goal status: IN PROGRESS  3.  Pt will demonstrate FTSTS with improved score of 4.2s (MCD) without use of UE to indicate improved functional BLE strength.  Baseline: 20.83 Goal status: IN PROGRESS  4.  Pt will demonstrate improved functional spinal mobility with <3/10 pain throughout ROM WFL to improve independence with ADL participation.  Baseline: decreased ROM in all directions with increased pain in lateral flexion/rotation on L side TL spine Goal status: IN PROGRESS  5.  Pt will be able to perform improved with <4/10 pain after performance in order to indicate improved functional independence in gait.  Baseline: 298 ft no AD Goal status: IN PROGRESS  ASSESSMENT:  CLINICAL IMPRESSION: Patient tolerated session well. BP taken at beginning of session 181/102. Pt reporting she had not taken her BP medicine today yet. Educated on importance of medicine to prevent further medical issues. Followed with core and LE strengthening in supine and seated positions. Patient demo posterior lean during seated march indicating core and hip weakness. Corrected with verbal and tactile cueing. Progressed to standing  RLE exercises. Pt demo mild buckling of knee at times but does not give out. Verbal cues for forward and lateral lean during hip extension and abduction. Patient put against mat table to further aid in correction. Pt reports inc in low back pain towards end following hip strengthening. Seated forward flexion with physio ball helps to decrease pain. Added seated LAQ and march to HEP with forward lumbar flexion as pt tolerated all well. Pt will continue to benefit from skilled PT interventions to address functional mobility safety and deficits impacting current QOL and independence.     OBJECTIVE IMPAIRMENTS: Abnormal gait, decreased activity tolerance, decreased balance, decreased endurance, decreased mobility, difficulty walking, decreased ROM, decreased strength,  decreased safety awareness, impaired flexibility, postural dysfunction, and pain.   ACTIVITY LIMITATIONS: carrying, lifting, bending, standing, squatting, sleeping, stairs, transfers, bed mobility, reach over head, hygiene/grooming, and locomotion level  PARTICIPATION LIMITATIONS: shopping, community activity, and occupation  PERSONAL FACTORS: 1-2 comorbidities: HTN , diabetes, hx of  are also affecting patient's functional outcome.   REHAB POTENTIAL: Fair    CLINICAL DECISION MAKING: Evolving/moderate complexity  EVALUATION COMPLEXITY: Moderate  PLAN:  PT FREQUENCY: 2x/week  PT DURATION: 6 weeks  PLANNED INTERVENTIONS: 97164- PT Re-evaluation, 97110-Therapeutic exercises, 97530- Therapeutic activity, [id number]- Neuromuscular re-education, 97535- Self Care, [id number]- Manual therapy, [phone]- Gait training, [phone]- Electrical stimulation (unattended), [phone]- Ultrasound, Patient/Family education, Balance training, and Stair training  PLAN FOR NEXT SESSION: monitor BP at beginning of session and ask about contact to MD regarding red flag symptoms; if medically appropriate.     10:24 AM, 06/27/24 Rosaria Settler, PT, DPT [facility] [place]

## 2024-06-28 ENCOUNTER — Ambulatory Visit (HOSPITAL_COMMUNITY): Payer: MEDICAID | Admitting: Physical Therapy

## 2024-07-03 ENCOUNTER — Telehealth (HOSPITAL_COMMUNITY): Payer: Self-pay

## 2024-07-03 ENCOUNTER — Ambulatory Visit (HOSPITAL_COMMUNITY): Payer: MEDICAID | Attending: Neurology

## 2024-07-03 DIAGNOSIS — R531 Weakness: Secondary | ICD-10-CM | POA: Insufficient documentation

## 2024-07-03 DIAGNOSIS — R262 Difficulty in walking, not elsewhere classified: Secondary | ICD-10-CM | POA: Insufficient documentation

## 2024-07-03 DIAGNOSIS — G8929 Other chronic pain: Secondary | ICD-10-CM | POA: Insufficient documentation

## 2024-07-03 DIAGNOSIS — M545 Low back pain, unspecified: Secondary | ICD-10-CM | POA: Insufficient documentation

## 2024-07-03 NOTE — Telephone Encounter (Signed)
 No show #1, called mobile, home and alternate phone number with no answer, did leave message on home phone mailbox concerning missed apt. Included next apt date and time with contract number included if needs to cancel or reschedule in the future.   [name], LPTA/CLT; WILLAIM [phone]

## 2024-07-05 ENCOUNTER — Telehealth (HOSPITAL_COMMUNITY): Payer: Self-pay | Admitting: Physical Therapy

## 2024-07-05 ENCOUNTER — Ambulatory Visit (HOSPITAL_COMMUNITY): Payer: MEDICAID

## 2024-07-05 NOTE — Telephone Encounter (Signed)
 Pt did not show for appt (2nd NS) Called and left VM regarding NS policy and reminder for last remaining appt scheduled for next Tuesday.    Greig KATHEE Fuse, PTA/CLT [facility] Ph: [phone]

## 2024-07-10 ENCOUNTER — Ambulatory Visit (HOSPITAL_COMMUNITY): Payer: MEDICAID

## 2024-07-10 ENCOUNTER — Telehealth (HOSPITAL_COMMUNITY): Payer: Self-pay

## 2024-07-10 ENCOUNTER — Encounter (HOSPITAL_COMMUNITY): Payer: Self-pay

## 2024-07-10 DIAGNOSIS — R531 Weakness: Secondary | ICD-10-CM

## 2024-07-10 DIAGNOSIS — G8929 Other chronic pain: Secondary | ICD-10-CM

## 2024-07-10 DIAGNOSIS — R262 Difficulty in walking, not elsewhere classified: Secondary | ICD-10-CM

## 2024-07-10 NOTE — Therapy (Signed)
 [facility] at [facility] [address] [place], [place], [address] Phone: [phone]   Fax:  [phone]  Patient Details  Name: Katrina Good MRN: [id number] Date of Birth: 10-06-1971 Referring Provider:  Georjean Darice HERO, MD  Encounter Date: 07/10/2024  Pt arrived 23 minutes late for session while therapist was on phone leaving her message concerning this apt.  Pt educated on no show policy details.  Discussed importance of attendance with pt and assured she has contact information if needs to cancel/reschedule in the future.  PT stated transportation is difficult as son had to leave work early, he dropped her off and went home to sleep.  Pt plans to walk home.  Given phone number for [employer] for transportation needs.  Plan to continue PT with 1 apt at a time.  [name], LPTA/CLT; CBIS [phone]  [name], PTA 07/10/2024, 10:06 AM  [facility] at [facility] [address] [place], [place], [address] Phone: [phone]   Fax:  [phone]

## 2024-07-10 NOTE — Telephone Encounter (Signed)
 3rd no show, called and left message concerning missed apt today. Included no show policy and discharged during message. Pt signed in at 9:53 while therapist was leaving message for 9:30 apt.   [name], LPTA/CLT; WILLAIM [phone]

## 2024-07-12 ENCOUNTER — Encounter (HOSPITAL_COMMUNITY): Payer: MEDICAID

## 2024-07-17 ENCOUNTER — Encounter (HOSPITAL_COMMUNITY): Payer: MEDICAID

## 2024-07-19 ENCOUNTER — Encounter (HOSPITAL_COMMUNITY): Payer: MEDICAID

## 2024-07-20 ENCOUNTER — Encounter (HOSPITAL_COMMUNITY): Payer: MEDICAID

## 2024-07-24 ENCOUNTER — Encounter (HOSPITAL_COMMUNITY): Payer: MEDICAID

## 2024-07-26 ENCOUNTER — Encounter (HOSPITAL_COMMUNITY): Payer: MEDICAID

## 2024-12-05 ENCOUNTER — Encounter (HOSPITAL_COMMUNITY): Payer: Self-pay

## 2024-12-05 ENCOUNTER — Emergency Department (HOSPITAL_COMMUNITY): Payer: MEDICAID

## 2024-12-05 ENCOUNTER — Other Ambulatory Visit: Payer: Self-pay

## 2024-12-05 ENCOUNTER — Emergency Department (HOSPITAL_COMMUNITY)
Admission: EM | Admit: 2024-12-05 | Discharge: 2024-12-05 | Disposition: A | Discharge status: Home / Self Care | Payer: MEDICAID | Source: Home / Self Care | Attending: Emergency Medicine | Admitting: Emergency Medicine

## 2024-12-05 DIAGNOSIS — S92214A Nondisplaced fracture of cuboid bone of right foot, initial encounter for closed fracture: Secondary | ICD-10-CM

## 2024-12-05 DIAGNOSIS — S92154A Nondisplaced avulsion fracture (chip fracture) of right talus, initial encounter for closed fracture: Secondary | ICD-10-CM

## 2024-12-05 DIAGNOSIS — S298XXA Other specified injuries of thorax, initial encounter: Secondary | ICD-10-CM

## 2024-12-05 DIAGNOSIS — S92034A Nondisplaced avulsion fracture of tuberosity of right calcaneus, initial encounter for closed fracture: Secondary | ICD-10-CM

## 2024-12-05 MED ORDER — IBUPROFEN 600 MG PO TABS: 600.0000 mg | ORAL_TABLET | Freq: Four times a day (QID) | ORAL | 0 refills | Status: AC | PRN

## 2024-12-05 MED ORDER — HYDROCODONE-ACETAMINOPHEN 5-325 MG PO TABS
1.0000 | ORAL_TABLET | Freq: Once | ORAL | Status: AC
  Administered 2024-12-05: 1 via ORAL
  Filled 2024-12-05: qty 1

## 2024-12-05 MED ORDER — HYDROCODONE-ACETAMINOPHEN 5-325 MG PO TABS: 1.0000 | ORAL_TABLET | Freq: Four times a day (QID) | ORAL | 0 refills | Status: AC | PRN

## 2024-12-05 MED ORDER — IBUPROFEN 400 MG PO TABS
600.0000 mg | ORAL_TABLET | Freq: Once | ORAL | Status: AC
  Administered 2024-12-05: 600 mg via ORAL
  Filled 2024-12-05: qty 2

## 2024-12-05 NOTE — ED Provider Notes (Signed)
 " [place] EMERGENCY DEPARTMENT AT [facility] Provider Note   CSN: [id number] Arrival date & time: 12/05/24  1526     Patient presents with: Katrina Good is a 54 y.o. female.  History of seizures, hypertension, asthma and diabetes, presents to the ER for right foot pain and swelling as well as right sided chest wall pain.  This happened after a fall after slipping in the snow several days ago, she has not been able to bear weight at all on the right foot and is having increasing pain primarily under the right breast, it feels like something sharp is poking her when she takes a deep breath she also has some tenderness of the right anterior chest that is painful as well.  She states she fell directly onto the right side of her chest and also twisted the foot.  She has not take any medications at home as she states she does not have anything to take for pain.  {Add pertinent medical, surgical, social history, OB history to YEP:67052}  Fall       Prior to Admission medications  Medication Sig Start Date End Date Taking? Authorizing Provider  albuterol  (VENTOLIN  HFA) 108 (90 Base) MCG/ACT inhaler INHALE 2 PUFFS BY MOUTH EVERY 6 HOURS AS NEEDED FOR COUGHING, WHEEZING, OR SHORTNESS OF BREATH Patient not taking: Reported on 03/19/2024 07/13/23   Comer Kirsch, PA-C  aspirin  81 MG chewable tablet Chew by mouth daily.    [provider]  guaiFENesin  (ROBITUSSIN) 100 MG/5ML liquid Take 15 mLs by mouth every 6 (six) hours as needed for cough or to loosen phlegm. 08/23/22   Elnor Bernarda SQUIBB, DO  levETIRAcetam  (KEPPRA ) 1000 MG tablet Take 1/2 tablet every morning, 1 tablet every night 03/19/24   Georjean Darice HERO, MD  losartan  (COZAAR ) 100 MG tablet Take 1 tablet (100 mg total) by mouth daily. 07/13/23   Comer Kirsch, PA-C  nortriptyline  (PAMELOR ) 25 MG capsule Take 1 capsule (25 mg total) by mouth at bedtime. 03/19/24   Georjean Darice HERO, MD    Allergies: Doxycycline  and  Strawberry extract    Review of Systems  Updated Vital Signs BP (!) 160/80 (BP Location: Right Arm)   Pulse 79   Temp 99.2 F (37.3 C) (Oral)   Resp 18   Ht 5' 9 (1.753 m)   Wt 99.8 kg   SpO2 98%   BMI 32.49 kg/m   Physical Exam Vitals and nursing note reviewed.  Constitutional:      General: She is not in acute distress.    Appearance: She is well-developed.  HENT:     Head: Normocephalic and atraumatic.  Eyes:     Conjunctiva/sclera: Conjunctivae normal.  Cardiovascular:     Rate and Rhythm: Normal rate and regular rhythm.     Heart sounds: No murmur heard. Pulmonary:     Effort: Pulmonary effort is normal. No respiratory distress.     Breath sounds: Normal breath sounds.  Chest:     Chest wall: Tenderness present. No deformity or crepitus.    Abdominal:     Palpations: Abdomen is soft.     Tenderness: There is no abdominal tenderness.  Musculoskeletal:        General: No swelling.     Cervical back: Neck supple.     Comments: There is swelling and tenderness with ecchymosis to the the right midfoot, DP and PT pulses intact, sensation intact to light touch throughout.  No calcaneal tenderness,  no ankle tenderness.  Skin:    General: Skin is warm and dry.     Capillary Refill: Capillary refill takes less than 2 seconds.  Neurological:     General: No focal deficit present.     Mental Status: She is alert and oriented to person, place, and time.  Psychiatric:        Mood and Affect: Mood normal.     (all labs ordered are listed, but only abnormal results are displayed) Labs Reviewed - No data to display  EKG: None  Radiology: DG Foot Complete Right Result Date: 12/05/2024 CLINICAL DATA:  Status post fall. Patient reports she fell and broke her foot 2 days ago. Pain. EXAM: RIGHT FOOT COMPLETE - 3+ VIEW COMPARISON:  Radiograph 09/01/2022 FINDINGS: There is no evidence of fracture or dislocation. Previous second toe fracture has healed. Slight hammertoe  deformity of the second and third toes. The alignment is otherwise normal. No erosive or bony destructive change. Soft tissue edema, greatest about the dorsum of the foot. IMPRESSION: Soft tissue edema. No acute fracture or dislocation. Electronically Signed   By: Andrea Gasman M.D.   On: 12/05/2024 16:20   DG Chest 2 View Result Date: 12/05/2024 CLINICAL DATA:  Status post fall.  Right rib pain. EXAM: CHEST - 2 VIEW COMPARISON:  Chest radiograph 08/24/2019. FINDINGS: Lung volumes are low. The heart is enlarged, likely in part accentuated by low lung volumes. Mediastinal contours are normal. No confluent opacity, large pleural effusion or pneumothorax. On limited assessment, no acute osseous findings. Visualized right ribs are intact, many of which are obscured by soft tissue attenuation from habitus. IMPRESSION: 1. Low lung volumes. 2. Cardiomegaly, likely in part accentuated by low lung volumes. Electronically Signed   By: Andrea Gasman M.D.   On: 12/05/2024 16:15    {Document cardiac monitor, telemetry assessment procedure when appropriate:32947} Procedures   Medications Ordered in the ED - No data to display    {Click here for ABCD2, HEART and other calculators REFRESH Note before signing:1}                              Medical Decision Making Differential diagnosis includes but not limited to fracture, sprain, strain, contusion, dislocation, other  Amount and/or Complexity of Data Reviewed Radiology: ordered.  Risk Prescription drug management.   ***  {Document critical care time when appropriate  Document review of labs and clinical decision tools ie CHADS2VASC2, etc  Document your independent review of radiology images and any outside records  Document your discussion with family members, caretakers and with consultants  Document social determinants of health affecting pt's care  Document your decision making why or why not admission, treatments were needed:32947:::1}    Final diagnoses:  None    ED Discharge Orders     None        "

## 2024-12-05 NOTE — ED Triage Notes (Signed)
 Pt states she fell & broke her right foot the day before yesterday. States she is also having right rib pain. States it was too busy to be seen the other day.

## 2024-12-05 NOTE — ED Notes (Signed)
 Pt asking to use the phone. Phone given to patient.

## 2024-12-05 NOTE — Discharge Instructions (Addendum)
 Your evaluated in the ER today for right foot pain and right sided rib pain after a fall.  There are no rib fractures or other significant abnormalities of your chest but your right foot does show small avulsion fracture fragments of the dorsal distal margin of the talus, dorsal distal margin of the calcaneus and dorsal proximal margin of the cuboid.  We are putting you in a cam boot, giving you crutches to use as needed and we will have you follow-up closely with the orthopedic doctor.  Keep your foot elevated as needed to help with pain and swelling, you can take the ibuprofen  as needed for pain, for more severe pain you can take the Norco that was prescribed.  Do not drive or operate machinery when taking this as it can make you sleepy.  Return to the ER for any new or worsening symptoms.

## 2024-12-07 ENCOUNTER — Ambulatory Visit: Payer: MEDICAID | Admitting: Orthopedic Surgery

## 2024-12-18 ENCOUNTER — Ambulatory Visit: Payer: MEDICAID | Admitting: Orthopedic Surgery
